# Patient Record
Sex: Male | Born: 1940 | Race: White | Hispanic: No | Marital: Married | Smoking: Never smoker
Health system: Southern US, Community
[De-identification: ages and names within clinical notes are randomized; demographics above are authoritative.]

## PROBLEM LIST (undated history)

## (undated) DIAGNOSIS — D72819 Decreased white blood cell count, unspecified: Secondary | ICD-10-CM

## (undated) DIAGNOSIS — I10 Essential (primary) hypertension: Secondary | ICD-10-CM

## (undated) DIAGNOSIS — D72821 Monocytosis (symptomatic): Secondary | ICD-10-CM

## (undated) DIAGNOSIS — D689 Coagulation defect, unspecified: Secondary | ICD-10-CM

## (undated) DIAGNOSIS — L0291 Cutaneous abscess, unspecified: Secondary | ICD-10-CM

## (undated) DIAGNOSIS — D693 Immune thrombocytopenic purpura: Principal | ICD-10-CM

## (undated) HISTORY — DX: Cutaneous abscess, unspecified: L02.91

## (undated) HISTORY — DX: Immune thrombocytopenic purpura: D69.3

## (undated) HISTORY — DX: Coagulation defect, unspecified: D68.9

## (undated) HISTORY — DX: Decreased white blood cell count, unspecified: D72.819

## (undated) HISTORY — DX: Essential (primary) hypertension: I10

## (undated) HISTORY — DX: Monocytosis (symptomatic): D72.821

---
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
## 1998-12-20 HISTORY — PX: CYSTECTOMY: SUR359

## 2003-12-21 HISTORY — PX: OTHER SURGICAL HISTORY: SHX169

## 2004-01-24 ENCOUNTER — Ambulatory Visit (HOSPITAL_COMMUNITY): Admission: RE | Admit: 2004-01-24 | Discharge: 2004-01-24 | Payer: Self-pay | Admitting: General Surgery

## 2004-01-24 ENCOUNTER — Ambulatory Visit (HOSPITAL_BASED_OUTPATIENT_CLINIC_OR_DEPARTMENT_OTHER): Admission: RE | Admit: 2004-01-24 | Discharge: 2004-01-24 | Payer: Self-pay | Admitting: General Surgery

## 2004-01-24 ENCOUNTER — Encounter (INDEPENDENT_AMBULATORY_CARE_PROVIDER_SITE_OTHER): Payer: Self-pay | Admitting: *Deleted

## 2004-12-25 ENCOUNTER — Ambulatory Visit (HOSPITAL_COMMUNITY): Admission: RE | Admit: 2004-12-25 | Discharge: 2004-12-25 | Payer: Self-pay | Admitting: Family Medicine

## 2008-01-25 ENCOUNTER — Ambulatory Visit: Payer: Self-pay | Admitting: Oncology

## 2008-02-09 LAB — CBC WITH DIFFERENTIAL/PLATELET
Basophils Absolute: 0 10*3/uL (ref 0.0–0.1)
EOS%: 1.1 % (ref 0.0–7.0)
Eosinophils Absolute: 0 10*3/uL (ref 0.0–0.5)
HCT: 43.8 % (ref 38.7–49.9)
HGB: 15.3 g/dL (ref 13.0–17.1)
MCH: 30.7 pg (ref 28.0–33.4)
MCV: 87.7 fL (ref 81.6–98.0)
MONO%: 11.9 % (ref 0.0–13.0)
NEUT#: 2.2 10*3/uL (ref 1.5–6.5)
NEUT%: 53.3 % (ref 40.0–75.0)

## 2008-02-09 LAB — CHCC SMEAR

## 2008-02-09 LAB — ERYTHROCYTE SEDIMENTATION RATE: Sed Rate: 3 mm/hr (ref 0–20)

## 2008-02-09 LAB — MORPHOLOGY: PLT EST: DECREASED

## 2008-02-12 LAB — LACTATE DEHYDROGENASE: LDH: 102 U/L (ref 94–250)

## 2008-02-12 LAB — RHEUMATOID FACTOR: Rhuematoid fact SerPl-aCnc: <20 IU/mL (ref 0–20)

## 2008-02-14 ENCOUNTER — Encounter: Admission: RE | Admit: 2008-02-14 | Discharge: 2008-02-14 | Payer: Self-pay | Admitting: Oncology

## 2008-03-06 LAB — CBC WITH DIFFERENTIAL/PLATELET
Eosinophils Absolute: 0 10*3/uL (ref 0.0–0.5)
HCT: 42.8 % (ref 38.7–49.9)
LYMPH%: 29.8 % (ref 14.0–48.0)
MCHC: 35 g/dL (ref 32.0–35.9)
MCV: 87.9 fL (ref 81.6–98.0)
MONO#: 0.5 10*3/uL (ref 0.1–0.9)
NEUT#: 2.7 10*3/uL (ref 1.5–6.5)
NEUT%: 58.7 % (ref 40.0–75.0)
Platelets: 60 10*3/uL — ABNORMAL LOW (ref 145–400)
WBC: 4.7 10*3/uL (ref 4.0–10.0)

## 2008-03-08 LAB — EPSTEIN-BARR VIRUS VCA, IGM: EBV VCA IgM: 0.29 {ISR}

## 2008-03-08 LAB — HEPATITIS C ANTIBODY: HCV Ab: NEGATIVE

## 2008-03-08 LAB — HEPATITIS B SURFACE ANTIGEN: Hepatitis B Surface Ag: NEGATIVE

## 2008-03-08 LAB — EPSTEIN-BARR VIRUS EARLY D ANTIGEN ANTIBODY, IGG: EBV EA IgG: 2.48 {ISR} — ABNORMAL HIGH

## 2008-03-08 LAB — HEPATITIS A ANTIBODY, IGM: Hep A IgM: NEGATIVE

## 2008-03-28 ENCOUNTER — Ambulatory Visit: Payer: Self-pay | Admitting: Oncology

## 2008-04-05 LAB — CBC WITH DIFFERENTIAL/PLATELET
Basophils Absolute: 0 10*3/uL (ref 0.0–0.1)
EOS%: 2.6 % (ref 0.0–7.0)
Eosinophils Absolute: 0.1 10*3/uL (ref 0.0–0.5)
HCT: 41.7 % (ref 38.7–49.9)
HGB: 14.8 g/dL (ref 13.0–17.1)
LYMPH%: 32.9 % (ref 14.0–48.0)
MCH: 31.2 pg (ref 28.0–33.4)
MCV: 87.7 fL (ref 81.6–98.0)
MONO%: 10.8 % (ref 0.0–13.0)
NEUT%: 53.6 % (ref 40.0–75.0)
Platelets: 57 10*3/uL — ABNORMAL LOW (ref 145–400)
RDW: 12.8 % (ref 11.2–14.6)

## 2008-05-01 LAB — CBC WITH DIFFERENTIAL/PLATELET
Basophils Absolute: 0 10*3/uL (ref 0.0–0.1)
EOS%: 2.2 % (ref 0.0–7.0)
Eosinophils Absolute: 0.1 10*3/uL (ref 0.0–0.5)
HCT: 43.3 % (ref 38.7–49.9)
HGB: 15.1 g/dL (ref 13.0–17.1)
MCH: 30.8 pg (ref 28.0–33.4)
NEUT#: 3.1 10*3/uL (ref 1.5–6.5)
NEUT%: 54.7 % (ref 40.0–75.0)
lymph#: 1.7 10*3/uL (ref 0.9–3.3)

## 2008-06-10 ENCOUNTER — Ambulatory Visit: Payer: Self-pay | Admitting: Oncology

## 2008-06-12 LAB — CBC WITH DIFFERENTIAL/PLATELET
BASO%: 0.4 % (ref 0.0–2.0)
Basophils Absolute: 0 10*3/uL (ref 0.0–0.1)
HCT: 42.5 % (ref 38.7–49.9)
HGB: 15 g/dL (ref 13.0–17.1)
MCHC: 35.3 g/dL (ref 32.0–35.9)
MONO#: 0.7 10*3/uL (ref 0.1–0.9)
NEUT%: 45.1 % (ref 40.0–75.0)
RDW: 12.9 % (ref 11.2–14.6)
WBC: 4.9 10*3/uL (ref 4.0–10.0)
lymph#: 1.9 10*3/uL (ref 0.9–3.3)

## 2008-08-04 ENCOUNTER — Ambulatory Visit: Payer: Self-pay | Admitting: Oncology

## 2008-08-07 LAB — CBC WITH DIFFERENTIAL/PLATELET
BASO%: 0.2 % (ref 0.0–2.0)
MCHC: 34.8 g/dL (ref 32.0–35.9)
MONO#: 0.7 10*3/uL (ref 0.1–0.9)
RBC: 4.76 10*6/uL (ref 4.20–5.71)
RDW: 12.6 % (ref 11.2–14.6)
WBC: 4.9 10*3/uL (ref 4.0–10.0)
lymph#: 1.7 10*3/uL (ref 0.9–3.3)

## 2008-09-04 LAB — CBC WITH DIFFERENTIAL/PLATELET
BASO%: 0.5 % (ref 0.0–2.0)
EOS%: 1 % (ref 0.0–7.0)
LYMPH%: 37 % (ref 14.0–48.0)
MCHC: 35 g/dL (ref 32.0–35.9)
MCV: 89.3 fL (ref 81.6–98.0)
MONO#: 0.7 10*3/uL (ref 0.1–0.9)
MONO%: 14.7 % — ABNORMAL HIGH (ref 0.0–13.0)
Platelets: 48 10*3/uL — ABNORMAL LOW (ref 145–400)
RBC: 4.73 10*6/uL (ref 4.20–5.71)
WBC: 4.6 10*3/uL (ref 4.0–10.0)

## 2008-09-30 ENCOUNTER — Ambulatory Visit: Payer: Self-pay | Admitting: Oncology

## 2008-10-02 LAB — CBC WITH DIFFERENTIAL/PLATELET
BASO%: 0.7 % (ref 0.0–2.0)
Basophils Absolute: 0 10*3/uL (ref 0.0–0.1)
Eosinophils Absolute: 0 10*3/uL (ref 0.0–0.5)
HCT: 43.9 % (ref 38.7–49.9)
HGB: 15.1 g/dL (ref 13.0–17.1)
LYMPH%: 36.7 % (ref 14.0–48.0)
MONO#: 0.8 10*3/uL (ref 0.1–0.9)
NEUT#: 2.7 10*3/uL (ref 1.5–6.5)
NEUT%: 47.6 % (ref 40.0–75.0)
Platelets: 32 10*3/uL — ABNORMAL LOW (ref 145–400)
WBC: 5.6 10*3/uL (ref 4.0–10.0)
lymph#: 2 10*3/uL (ref 0.9–3.3)

## 2008-10-09 LAB — CBC WITH DIFFERENTIAL/PLATELET
BASO%: 1 % (ref 0.0–2.0)
Basophils Absolute: 0 10*3/uL (ref 0.0–0.1)
EOS%: 1 % (ref 0.0–7.0)
HCT: 42.5 % (ref 38.7–49.9)
HGB: 14.9 g/dL (ref 13.0–17.1)
LYMPH%: 39 % (ref 14.0–48.0)
MCH: 31.1 pg (ref 28.0–33.4)
MCHC: 35 g/dL (ref 32.0–35.9)
NEUT%: 42.5 % (ref 40.0–75.0)
Platelets: 35 10*3/uL — ABNORMAL LOW (ref 145–400)
lymph#: 1.8 10*3/uL (ref 0.9–3.3)

## 2008-10-16 LAB — CBC WITH DIFFERENTIAL/PLATELET
BASO%: 0.3 % (ref 0.0–2.0)
Basophils Absolute: 0 10*3/uL (ref 0.0–0.1)
EOS%: 1.2 % (ref 0.0–7.0)
HCT: 43.7 % (ref 38.7–49.9)
HGB: 15.3 g/dL (ref 13.0–17.1)
MCH: 31 pg (ref 28.0–33.4)
MCHC: 35 g/dL (ref 32.0–35.9)
MCV: 88.8 fL (ref 81.6–98.0)
MONO%: 15.5 % — ABNORMAL HIGH (ref 0.0–13.0)
NEUT%: 50 % (ref 40.0–75.0)
RDW: 12.3 % (ref 11.2–14.6)
lymph#: 1.5 10*3/uL (ref 0.9–3.3)

## 2008-10-23 ENCOUNTER — Ambulatory Visit (HOSPITAL_COMMUNITY): Admission: RE | Admit: 2008-10-23 | Discharge: 2008-10-23 | Payer: Self-pay | Admitting: Oncology

## 2008-10-23 LAB — CBC WITH DIFFERENTIAL/PLATELET
Basophils Absolute: 0 10*3/uL (ref 0.0–0.1)
Eosinophils Absolute: 0 10*3/uL (ref 0.0–0.5)
HGB: 14.9 g/dL (ref 13.0–17.1)
MCV: 89 fL (ref 81.6–98.0)
MONO#: 0.7 10*3/uL (ref 0.1–0.9)
MONO%: 15.3 % — ABNORMAL HIGH (ref 0.0–13.0)
NEUT#: 2.1 10*3/uL (ref 1.5–6.5)
Platelets: 33 10*3/uL — ABNORMAL LOW (ref 145–400)
RBC: 4.81 10*6/uL (ref 4.20–5.71)
RDW: 12.9 % (ref 11.2–14.6)
WBC: 4.5 10*3/uL (ref 4.0–10.0)

## 2008-10-23 LAB — CHCC SMEAR

## 2008-10-30 LAB — CBC WITH DIFFERENTIAL/PLATELET
Basophils Absolute: 0 10*3/uL (ref 0.0–0.1)
Eosinophils Absolute: 0 10*3/uL (ref 0.0–0.5)
HCT: 45.1 % (ref 38.7–49.9)
HGB: 15.2 g/dL (ref 13.0–17.1)
LYMPH%: 14.9 % (ref 14.0–48.0)
MCHC: 33.8 g/dL (ref 32.0–35.9)
MONO#: 1.1 10*3/uL — ABNORMAL HIGH (ref 0.1–0.9)
NEUT%: 72.6 % (ref 40.0–75.0)
Platelets: 27 10*3/uL — ABNORMAL LOW (ref 145–400)
WBC: 9 10*3/uL (ref 4.0–10.0)
lymph#: 1.3 10*3/uL (ref 0.9–3.3)

## 2008-11-06 LAB — CBC WITH DIFFERENTIAL/PLATELET
BASO%: 0.9 % (ref 0.0–2.0)
Eosinophils Absolute: 0 10*3/uL (ref 0.0–0.5)
MCHC: 35.1 g/dL (ref 32.0–35.9)
MCV: 87.5 fL (ref 81.6–98.0)
MONO%: 3.3 % (ref 0.0–13.0)
NEUT#: 11.7 10*3/uL — ABNORMAL HIGH (ref 1.5–6.5)
RBC: 5.09 10*6/uL (ref 4.20–5.71)
RDW: 12.1 % (ref 11.2–14.6)
WBC: 14.2 10*3/uL — ABNORMAL HIGH (ref 4.0–10.0)

## 2008-11-13 LAB — CBC WITH DIFFERENTIAL/PLATELET
BASO%: 0.2 % (ref 0.0–2.0)
Eosinophils Absolute: 0 10*3/uL (ref 0.0–0.5)
LYMPH%: 11.1 % — ABNORMAL LOW (ref 14.0–48.0)
MCHC: 34.5 g/dL (ref 32.0–35.9)
MONO#: 0.8 10*3/uL (ref 0.1–0.9)
NEUT#: 8.3 10*3/uL — ABNORMAL HIGH (ref 1.5–6.5)
Platelets: 39 10*3/uL — ABNORMAL LOW (ref 145–400)
RBC: 5.05 10*6/uL (ref 4.20–5.71)
RDW: 14.1 % (ref 11.2–14.6)
WBC: 10.2 10*3/uL — ABNORMAL HIGH (ref 4.0–10.0)
lymph#: 1.1 10*3/uL (ref 0.9–3.3)

## 2008-11-18 ENCOUNTER — Ambulatory Visit: Payer: Self-pay | Admitting: Oncology

## 2008-11-20 LAB — COMPREHENSIVE METABOLIC PANEL
ALT: 58 U/L — ABNORMAL HIGH (ref 0–53)
AST: 24 U/L (ref 0–37)
Albumin: 3 g/dL — ABNORMAL LOW (ref 3.5–5.2)
BUN: 18 mg/dL (ref 6–23)
Calcium: 8.6 mg/dL (ref 8.4–10.5)
Chloride: 105 mEq/L (ref 96–112)
Potassium: 4 mEq/L (ref 3.5–5.3)
Sodium: 140 mEq/L (ref 135–145)
Total Protein: 5.3 g/dL — ABNORMAL LOW (ref 6.0–8.3)

## 2008-11-20 LAB — CBC WITH DIFFERENTIAL/PLATELET
BASO%: 0.9 % (ref 0.0–2.0)
Eosinophils Absolute: 0 10*3/uL (ref 0.0–0.5)
MCHC: 34.3 g/dL (ref 32.0–35.9)
MONO#: 1.5 10*3/uL — ABNORMAL HIGH (ref 0.1–0.9)
NEUT#: 5.6 10*3/uL (ref 1.5–6.5)
RBC: 5 10*6/uL (ref 4.20–5.71)
RDW: 14.2 % (ref 11.2–14.6)
WBC: 9.5 10*3/uL (ref 4.0–10.0)
lymph#: 2.3 10*3/uL (ref 0.9–3.3)

## 2008-12-04 LAB — CBC WITH DIFFERENTIAL/PLATELET
BASO%: 0.8 % (ref 0.0–2.0)
EOS%: 0.3 % (ref 0.0–7.0)
HGB: 15.4 g/dL (ref 13.0–17.1)
MCH: 31.1 pg (ref 28.0–33.4)
MCHC: 34.4 g/dL (ref 32.0–35.9)
MONO%: 12.8 % (ref 0.0–13.0)
RBC: 4.94 10*6/uL (ref 4.20–5.71)
RDW: 14.6 % (ref 11.2–14.6)
lymph#: 2.3 10*3/uL (ref 0.9–3.3)

## 2008-12-11 LAB — CBC WITH DIFFERENTIAL/PLATELET
Basophils Absolute: 0 10*3/uL (ref 0.0–0.1)
Eosinophils Absolute: 0 10*3/uL (ref 0.0–0.5)
HGB: 15.6 g/dL (ref 13.0–17.1)
NEUT#: 4.6 10*3/uL (ref 1.5–6.5)
RBC: 5 10*6/uL (ref 4.20–5.71)
RDW: 14.2 % (ref 11.2–14.6)
WBC: 8.5 10*3/uL (ref 4.0–10.0)
lymph#: 2.8 10*3/uL (ref 0.9–3.3)

## 2008-12-23 ENCOUNTER — Ambulatory Visit: Payer: Self-pay | Admitting: Oncology

## 2008-12-25 LAB — CBC WITH DIFFERENTIAL/PLATELET
BASO%: 0.7 % (ref 0.0–2.0)
LYMPH%: 29.9 % (ref 14.0–48.0)
MCHC: 34.3 g/dL (ref 32.0–35.9)
MONO#: 1.3 10*3/uL — ABNORMAL HIGH (ref 0.1–0.9)
RBC: 4.56 10*6/uL (ref 4.20–5.71)
WBC: 9.6 10*3/uL (ref 4.0–10.0)
lymph#: 2.9 10*3/uL (ref 0.9–3.3)

## 2009-01-01 LAB — CBC WITH DIFFERENTIAL/PLATELET
Basophils Absolute: 0 10*3/uL (ref 0.0–0.1)
EOS%: 0.3 % (ref 0.0–7.0)
Eosinophils Absolute: 0 10*3/uL (ref 0.0–0.5)
LYMPH%: 26.7 % (ref 14.0–48.0)
MCH: 31.1 pg (ref 28.0–33.4)
MCV: 91 fL (ref 81.6–98.0)
MONO%: 10.4 % (ref 0.0–13.0)
NEUT#: 5.3 10*3/uL (ref 1.5–6.5)
Platelets: 55 10*3/uL — ABNORMAL LOW (ref 145–400)
RBC: 4.6 10*6/uL (ref 4.20–5.71)
RDW: 14.4 % (ref 11.2–14.6)

## 2009-01-15 LAB — CBC WITH DIFFERENTIAL/PLATELET
BASO%: 0.4 % (ref 0.0–2.0)
LYMPH%: 34.1 % (ref 14.0–48.0)
MCHC: 34 g/dL (ref 32.0–35.9)
MCV: 92.3 fL (ref 81.6–98.0)
MONO%: 14.7 % — ABNORMAL HIGH (ref 0.0–13.0)
NEUT#: 4.7 10*3/uL (ref 1.5–6.5)
Platelets: 48 10*3/uL — ABNORMAL LOW (ref 145–400)
RBC: 4.8 10*6/uL (ref 4.20–5.71)
RDW: 15.1 % — ABNORMAL HIGH (ref 11.2–14.6)
WBC: 9.3 10*3/uL (ref 4.0–10.0)

## 2009-01-22 LAB — CBC WITH DIFFERENTIAL/PLATELET
BASO%: 0.8 % (ref 0.0–2.0)
EOS%: 0.6 % (ref 0.0–7.0)
HCT: 43.4 % (ref 38.7–49.9)
MCH: 31.3 pg (ref 28.0–33.4)
MCHC: 33.6 g/dL (ref 32.0–35.9)
MONO%: 13.9 % — ABNORMAL HIGH (ref 0.0–13.0)
NEUT%: 50.1 % (ref 40.0–75.0)
lymph#: 3 10*3/uL (ref 0.9–3.3)

## 2009-02-05 LAB — CBC WITH DIFFERENTIAL/PLATELET
BASO%: 0.4 % (ref 0.0–2.0)
Basophils Absolute: 0 10*3/uL (ref 0.0–0.1)
EOS%: 0.3 % (ref 0.0–7.0)
HGB: 14.6 g/dL (ref 13.0–17.1)
MCH: 31.8 pg (ref 28.0–33.4)
MCHC: 33.9 g/dL (ref 32.0–35.9)
RBC: 4.6 10*6/uL (ref 4.20–5.71)
RDW: 15.3 % — ABNORMAL HIGH (ref 11.2–14.6)
lymph#: 2.7 10*3/uL (ref 0.9–3.3)

## 2009-02-10 ENCOUNTER — Ambulatory Visit: Payer: Self-pay | Admitting: Oncology

## 2009-02-12 LAB — CBC WITH DIFFERENTIAL/PLATELET
BASO%: 0.5 % (ref 0.0–2.0)
EOS%: 0.4 % (ref 0.0–7.0)
LYMPH%: 30.9 % (ref 14.0–49.0)
MCHC: 34 g/dL (ref 32.0–36.0)
MCV: 93.6 fL (ref 79.3–98.0)
MONO%: 15.8 % — ABNORMAL HIGH (ref 0.0–14.0)
Platelets: 50 10*3/uL — ABNORMAL LOW (ref 140–400)
RBC: 4.81 10*6/uL (ref 4.20–5.82)
WBC: 7.7 10*3/uL (ref 4.0–10.3)

## 2009-02-19 LAB — COMPREHENSIVE METABOLIC PANEL
ALT: 174 U/L — ABNORMAL HIGH (ref 0–53)
AST: 47 U/L — ABNORMAL HIGH (ref 0–37)
Albumin: 3.5 g/dL (ref 3.5–5.2)
Alkaline Phosphatase: 30 U/L — ABNORMAL LOW (ref 39–117)
Calcium: 9.8 mg/dL (ref 8.4–10.5)
Chloride: 105 mEq/L (ref 96–112)
Creatinine, Ser: 1.16 mg/dL (ref 0.40–1.50)
Potassium: 4.9 mEq/L (ref 3.5–5.3)

## 2009-02-19 LAB — CBC WITH DIFFERENTIAL/PLATELET
BASO%: 0.1 % (ref 0.0–2.0)
EOS%: 0 % (ref 0.0–7.0)
MCH: 31.7 pg (ref 27.2–33.4)
MCHC: 33.8 g/dL (ref 32.0–36.0)
MCV: 93.6 fL (ref 79.3–98.0)
MONO%: 5.2 % (ref 0.0–14.0)
RBC: 5.01 10*6/uL (ref 4.20–5.82)
RDW: 14.3 % (ref 11.0–14.6)
lymph#: 0.9 10*3/uL (ref 0.9–3.3)

## 2009-02-19 LAB — LACTATE DEHYDROGENASE: LDH: 175 U/L (ref 94–250)

## 2009-03-05 LAB — CBC WITH DIFFERENTIAL/PLATELET
BASO%: 1 % (ref 0.0–2.0)
EOS%: 0.5 % (ref 0.0–7.0)
HCT: 43.2 % (ref 38.4–49.9)
LYMPH%: 28.2 % (ref 14.0–49.0)
MCH: 31.8 pg (ref 27.2–33.4)
MCHC: 34.1 g/dL (ref 32.0–36.0)
NEUT%: 56.7 % (ref 39.0–75.0)
Platelets: 40 10*3/uL — ABNORMAL LOW (ref 140–400)
lymph#: 2.7 10*3/uL (ref 0.9–3.3)

## 2009-03-05 LAB — COMPREHENSIVE METABOLIC PANEL
ALT: 125 U/L — ABNORMAL HIGH (ref 0–53)
AST: 34 U/L (ref 0–37)
Alkaline Phosphatase: 39 U/L (ref 39–117)
Creatinine, Ser: 1.04 mg/dL (ref 0.40–1.50)
Total Bilirubin: 0.4 mg/dL (ref 0.3–1.2)

## 2009-03-19 LAB — CBC WITH DIFFERENTIAL/PLATELET
BASO%: 0.5 % (ref 0.0–2.0)
EOS%: 0.3 % (ref 0.0–7.0)
HCT: 44 % (ref 38.4–49.9)
LYMPH%: 30.6 % (ref 14.0–49.0)
MCH: 31.7 pg (ref 27.2–33.4)
MCHC: 34.6 g/dL (ref 32.0–36.0)
NEUT%: 56.9 % (ref 39.0–75.0)
Platelets: 44 10*3/uL — ABNORMAL LOW (ref 140–400)

## 2009-03-31 ENCOUNTER — Ambulatory Visit: Payer: Self-pay | Admitting: Oncology

## 2009-04-02 LAB — CBC WITH DIFFERENTIAL/PLATELET
Basophils Absolute: 0.1 10*3/uL (ref 0.0–0.1)
EOS%: 0.4 % (ref 0.0–7.0)
HCT: 44.2 % (ref 38.4–49.9)
HGB: 15.1 g/dL (ref 13.0–17.1)
LYMPH%: 25 % (ref 14.0–49.0)
MCH: 31.3 pg (ref 27.2–33.4)
MCV: 91.5 fL (ref 79.3–98.0)
MONO%: 12.6 % (ref 0.0–14.0)
NEUT%: 61.2 % (ref 39.0–75.0)
RDW: 13.1 % (ref 11.0–14.6)

## 2009-04-16 LAB — COMPREHENSIVE METABOLIC PANEL
ALT: 91 U/L — ABNORMAL HIGH (ref 0–53)
Albumin: 4.1 g/dL (ref 3.5–5.2)
CO2: 28 mEq/L (ref 19–32)
Calcium: 9.1 mg/dL (ref 8.4–10.5)
Chloride: 105 mEq/L (ref 96–112)
Creatinine, Ser: 1.01 mg/dL (ref 0.40–1.50)
Potassium: 4.8 mEq/L (ref 3.5–5.3)
Sodium: 140 mEq/L (ref 135–145)
Total Protein: 6.8 g/dL (ref 6.0–8.3)

## 2009-04-16 LAB — CBC WITH DIFFERENTIAL/PLATELET
BASO%: 1 % (ref 0.0–2.0)
HCT: 46.5 % (ref 38.4–49.9)
MCHC: 34.1 g/dL (ref 32.0–36.0)
MONO#: 0.6 10*3/uL (ref 0.1–0.9)
NEUT%: 65.3 % (ref 39.0–75.0)
RDW: 13 % (ref 11.0–14.6)
WBC: 5.5 10*3/uL (ref 4.0–10.3)
lymph#: 1.3 10*3/uL (ref 0.9–3.3)

## 2009-04-16 LAB — LACTATE DEHYDROGENASE: LDH: 167 U/L (ref 94–250)

## 2009-04-25 IMAGING — CR DG CHEST 2V
2 series · 2 of 2 positions shown · non-contrast
Comparison: 12/25/2004

CLINICAL DATA: Low platelets and increased monocytes/evaluate for
granulomatous disease

CHEST - 2 VIEW

[w chest pa]
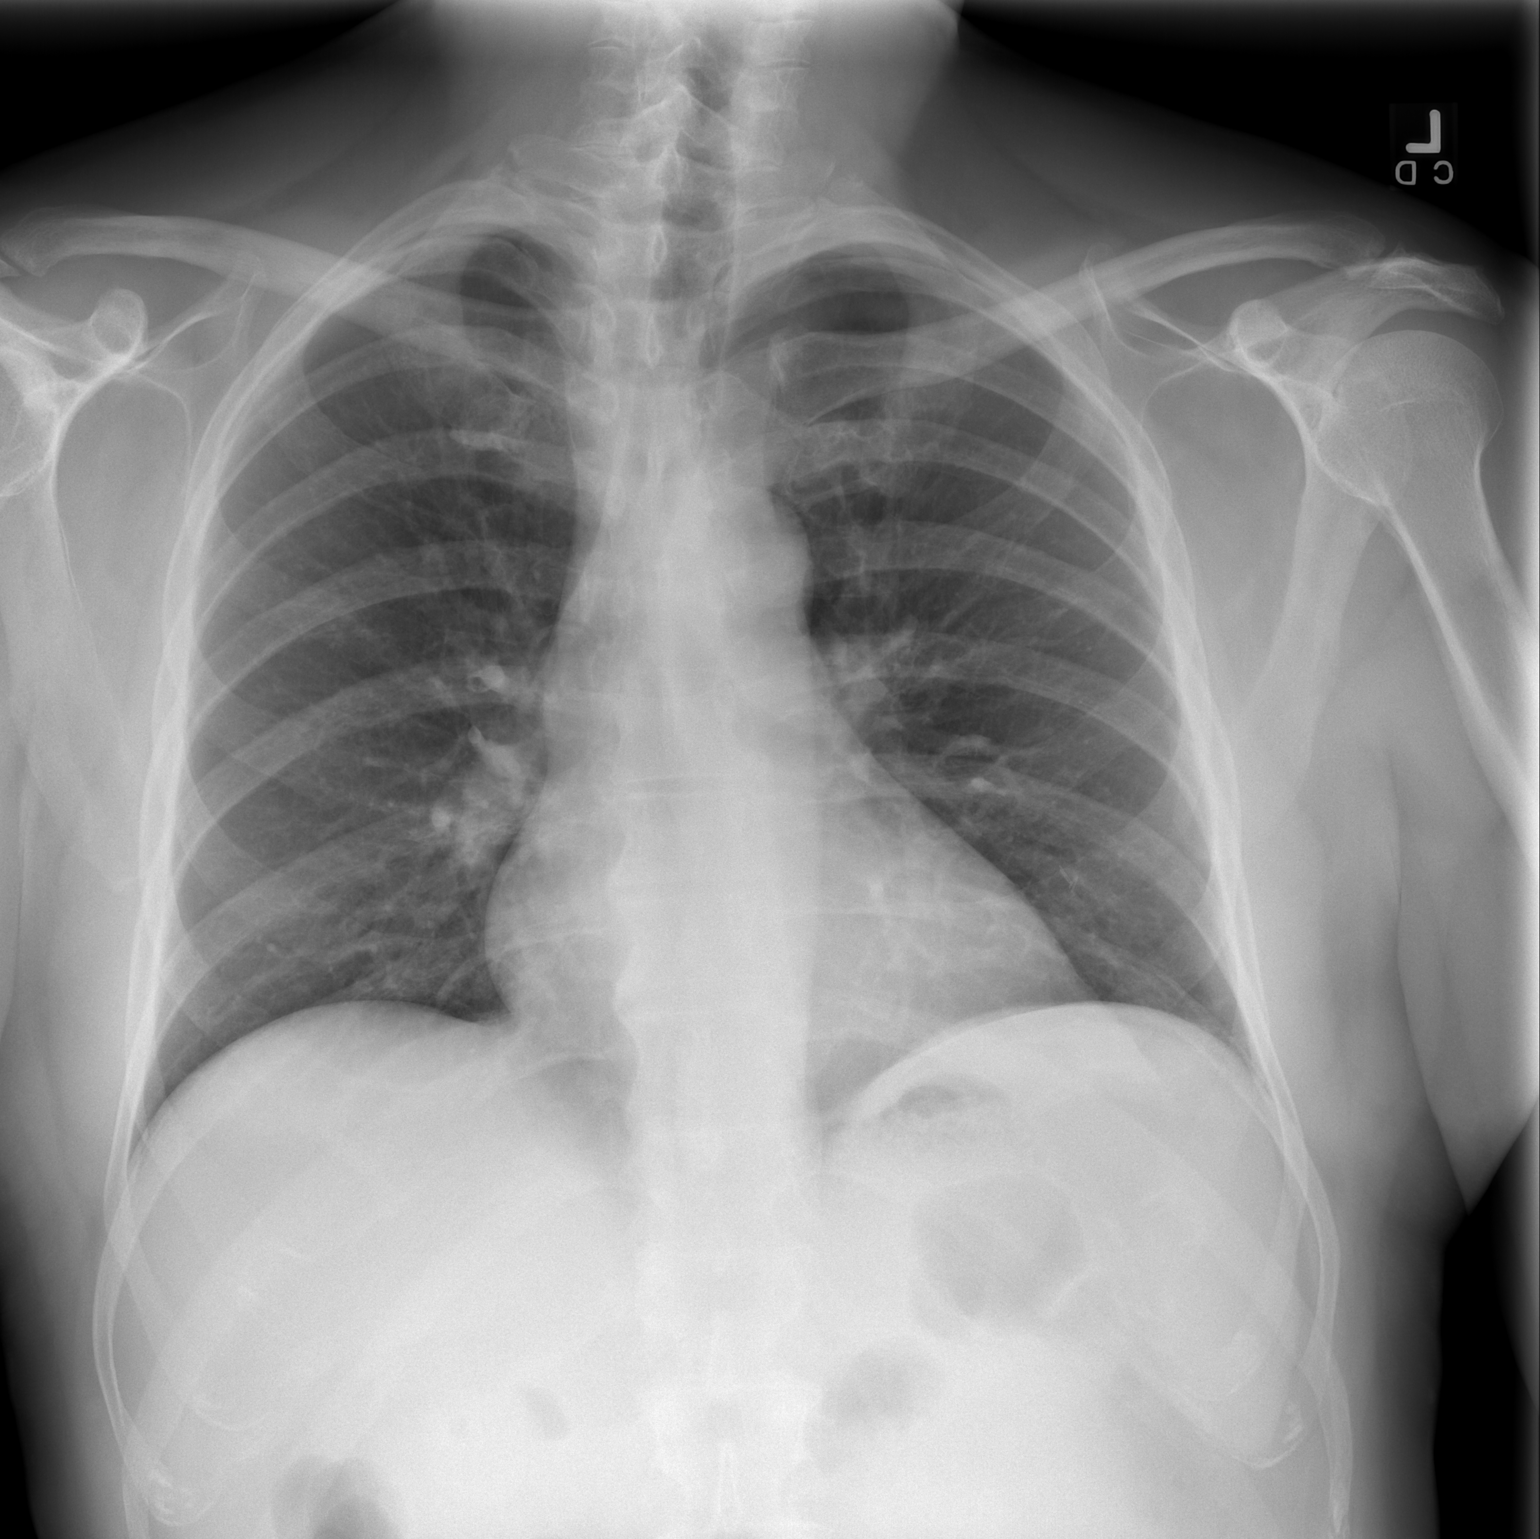

[w chest lat]
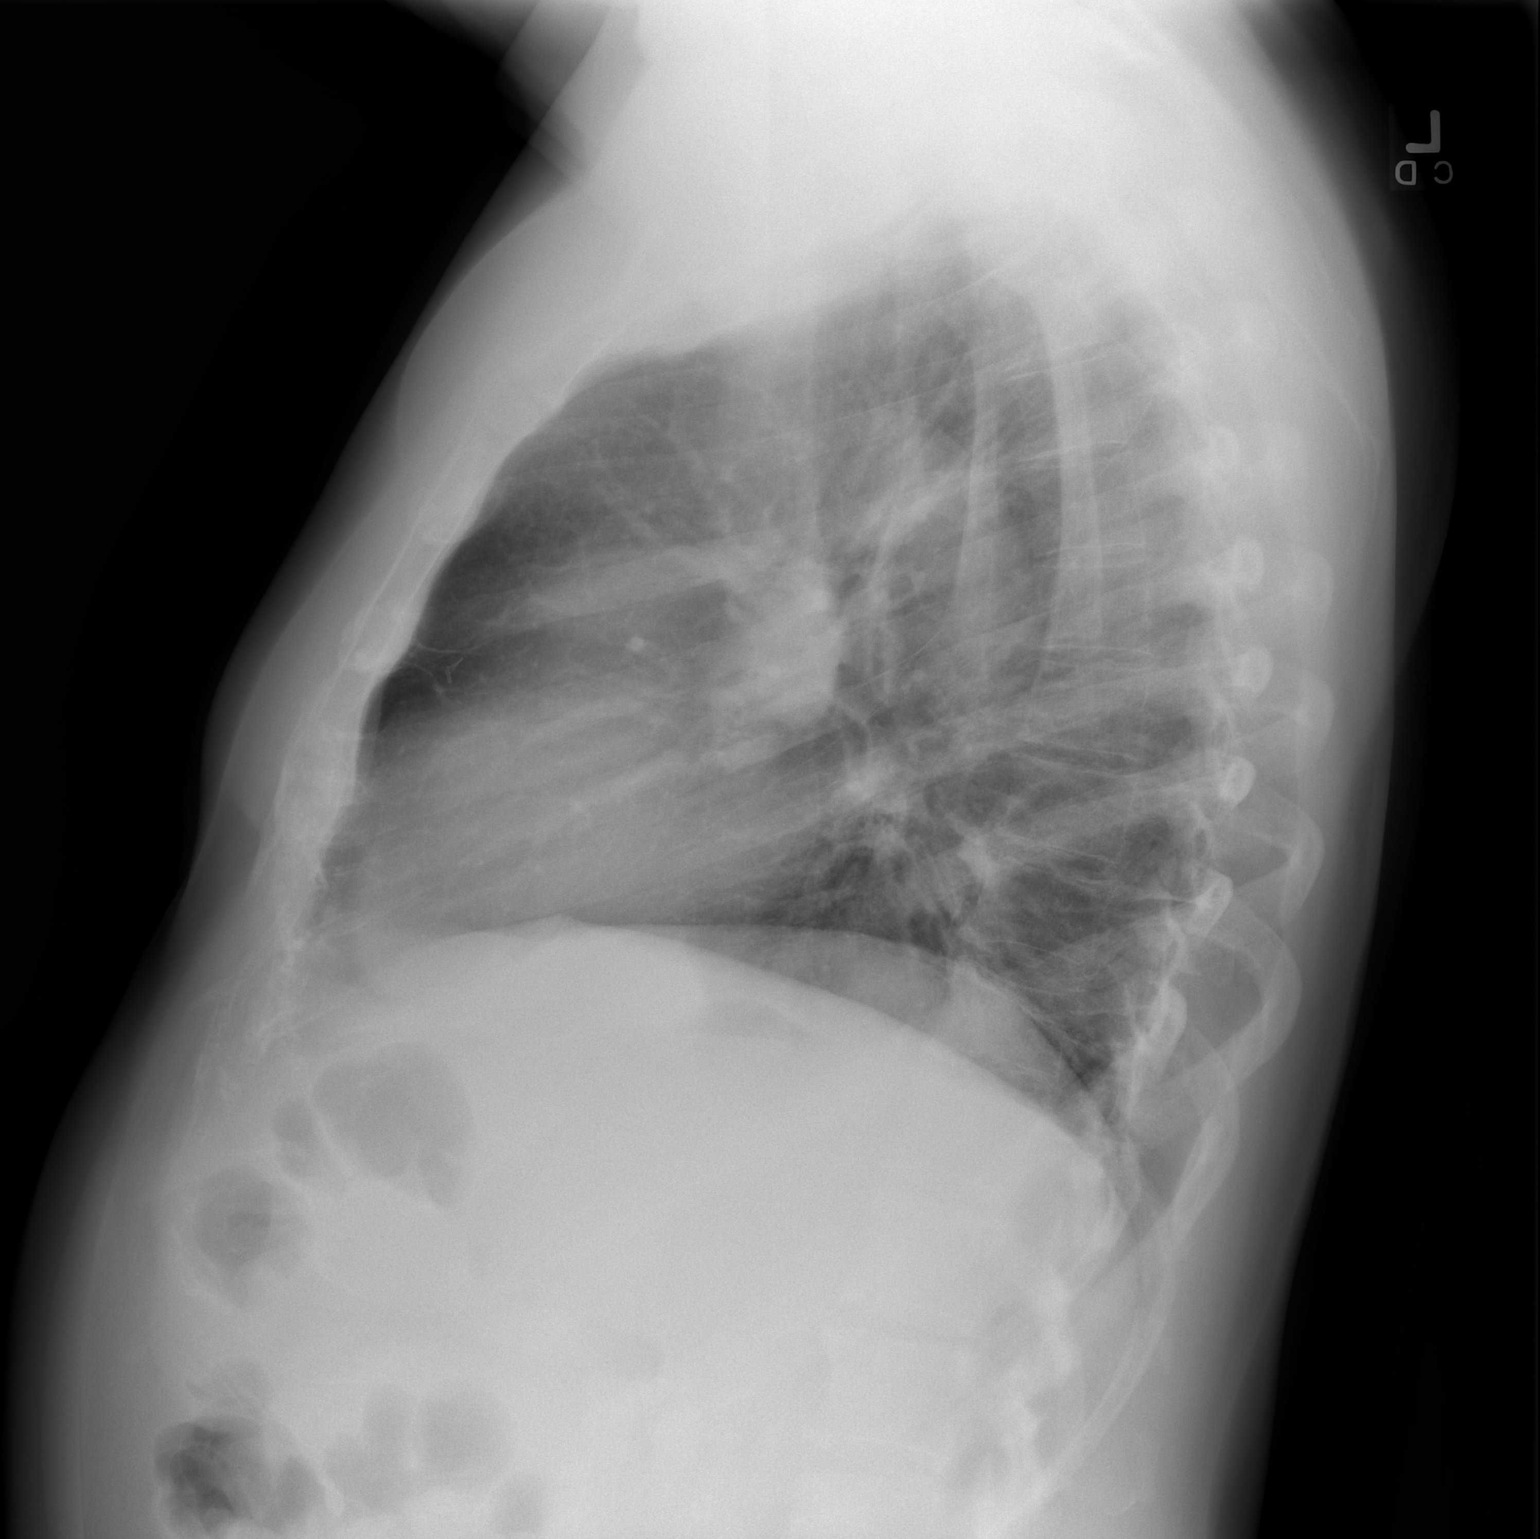

[2 of 2 positions shown; findings below may reference images not displayed]

FINDINGS: Heart and mediastinal contours normal.  Lungs clear.  No
definite granulomatous lesions.  No calcified mediastinal nodes.
There is mild peribronchial thickening.

No pleural fluid or osseous lesions.
IMPRESSION: Mild peribronchial thickening - no active disease or definite
changes of old granulomatous disease.

## 2009-04-30 LAB — CBC WITH DIFFERENTIAL/PLATELET
Basophils Absolute: 0 10*3/uL (ref 0.0–0.1)
Eosinophils Absolute: 0 10*3/uL (ref 0.0–0.5)
HCT: 43.4 % (ref 38.4–49.9)
HGB: 14.8 g/dL (ref 13.0–17.1)
LYMPH%: 39 % (ref 14.0–49.0)
MONO#: 0.7 10*3/uL (ref 0.1–0.9)
NEUT#: 1.5 10*3/uL (ref 1.5–6.5)
NEUT%: 41.4 % (ref 39.0–75.0)
Platelets: 35 10*3/uL — ABNORMAL LOW (ref 140–400)
WBC: 3.7 10*3/uL — ABNORMAL LOW (ref 4.0–10.3)

## 2009-05-14 LAB — CBC WITH DIFFERENTIAL/PLATELET
BASO%: 0.3 % (ref 0.0–2.0)
Basophils Absolute: 0 10e3/uL (ref 0.0–0.1)
EOS%: 0.5 % (ref 0.0–7.0)
Eosinophils Absolute: 0 10e3/uL (ref 0.0–0.5)
HCT: 41.6 % (ref 38.4–49.9)
HGB: 14.3 g/dL (ref 13.0–17.1)
LYMPH%: 41.4 % (ref 14.0–49.0)
MCH: 29.7 pg (ref 27.2–33.4)
MCHC: 34.4 g/dL (ref 32.0–36.0)
MCV: 86.5 fL (ref 79.3–98.0)
MONO#: 0.8 10e3/uL (ref 0.1–0.9)
MONO%: 21.1 % — ABNORMAL HIGH (ref 0.0–14.0)
NEUT#: 1.5 10e3/uL (ref 1.5–6.5)
NEUT%: 36.7 % — ABNORMAL LOW (ref 39.0–75.0)
Platelets: 36 10e3/uL — ABNORMAL LOW (ref 140–400)
RBC: 4.81 10e6/uL (ref 4.20–5.82)
RDW: 12.7 % (ref 11.0–14.6)
WBC: 4 10e3/uL (ref 4.0–10.3)
lymph#: 1.7 10e3/uL (ref 0.9–3.3)
nRBC: 0 % (ref 0–0)

## 2009-05-26 ENCOUNTER — Ambulatory Visit: Payer: Self-pay | Admitting: Oncology

## 2009-05-28 LAB — CBC WITH DIFFERENTIAL/PLATELET
BASO%: 0.5 % (ref 0.0–2.0)
Eosinophils Absolute: 0 10*3/uL (ref 0.0–0.5)
LYMPH%: 38.7 % (ref 14.0–49.0)
MCHC: 34.2 g/dL (ref 32.0–36.0)
MONO#: 0.8 10*3/uL (ref 0.1–0.9)
NEUT#: 1.5 10*3/uL (ref 1.5–6.5)
RBC: 4.91 10*6/uL (ref 4.20–5.82)
RDW: 12.6 % (ref 11.0–14.6)
WBC: 3.9 10*3/uL — ABNORMAL LOW (ref 4.0–10.3)
lymph#: 1.5 10*3/uL (ref 0.9–3.3)
nRBC: 0 % (ref 0–0)

## 2009-06-12 ENCOUNTER — Encounter: Payer: Self-pay | Admitting: Oncology

## 2009-06-12 ENCOUNTER — Other Ambulatory Visit: Admission: RE | Admit: 2009-06-12 | Discharge: 2009-06-12 | Payer: Self-pay | Admitting: Oncology

## 2009-06-12 LAB — MORPHOLOGY
PLT EST: DECREASED
RBC Comments: NORMAL

## 2009-06-12 LAB — CBC WITH DIFFERENTIAL/PLATELET
BASO%: 0.3 % (ref 0.0–2.0)
Basophils Absolute: 0 10*3/uL (ref 0.0–0.1)
EOS%: 1 % (ref 0.0–7.0)
HGB: 14.9 g/dL (ref 13.0–17.1)
MCH: 29.8 pg (ref 27.2–33.4)
MCHC: 34.6 g/dL (ref 32.0–36.0)
MONO#: 0.7 10*3/uL (ref 0.1–0.9)
RDW: 12.5 % (ref 11.0–14.6)
WBC: 3.1 10*3/uL — ABNORMAL LOW (ref 4.0–10.3)
lymph#: 1.4 10*3/uL (ref 0.9–3.3)

## 2009-06-12 LAB — CHCC SMEAR

## 2009-06-18 LAB — CBC WITH DIFFERENTIAL/PLATELET
Eosinophils Absolute: 0 10*3/uL (ref 0.0–0.5)
MONO#: 1 10*3/uL — ABNORMAL HIGH (ref 0.1–0.9)
NEUT#: 1.8 10*3/uL (ref 1.5–6.5)
RBC: 4.95 10*6/uL (ref 4.20–5.82)
RDW: 12.5 % (ref 11.0–14.6)
WBC: 4.2 10*3/uL (ref 4.0–10.3)

## 2009-06-27 ENCOUNTER — Ambulatory Visit: Payer: Self-pay | Admitting: Oncology

## 2009-07-01 LAB — COMPREHENSIVE METABOLIC PANEL
ALT: 27 U/L (ref 0–53)
Albumin: 4 g/dL (ref 3.5–5.2)
CO2: 24 mEq/L (ref 19–32)
Calcium: 9.3 mg/dL (ref 8.4–10.5)
Chloride: 108 mEq/L (ref 96–112)
Creatinine, Ser: 0.9 mg/dL (ref 0.40–1.50)
Potassium: 4.2 mEq/L (ref 3.5–5.3)

## 2009-07-01 LAB — CBC WITH DIFFERENTIAL/PLATELET
Basophils Absolute: 0 10*3/uL (ref 0.0–0.1)
EOS%: 0.4 % (ref 0.0–7.0)
Eosinophils Absolute: 0 10*3/uL (ref 0.0–0.5)
LYMPH%: 41.7 % (ref 14.0–49.0)
MCH: 29.7 pg (ref 27.2–33.4)
MCV: 84.5 fL (ref 79.3–98.0)
MONO%: 23.4 % — ABNORMAL HIGH (ref 0.0–14.0)
NEUT#: 0.9 10*3/uL — ABNORMAL LOW (ref 1.5–6.5)
Platelets: 30 10*3/uL — ABNORMAL LOW (ref 140–400)
RBC: 5.02 10*6/uL (ref 4.20–5.82)
nRBC: 0 % (ref 0–0)

## 2009-07-01 LAB — LACTATE DEHYDROGENASE: LDH: 190 U/L (ref 94–250)

## 2009-07-15 LAB — CBC WITH DIFFERENTIAL/PLATELET
BASO%: 0.7 % (ref 0.0–2.0)
Basophils Absolute: 0 10*3/uL (ref 0.0–0.1)
EOS%: 1 % (ref 0.0–7.0)
HGB: 15.6 g/dL (ref 13.0–17.1)
MCH: 29.7 pg (ref 27.2–33.4)
MCHC: 34.9 g/dL (ref 32.0–36.0)
MCV: 85.1 fL (ref 79.3–98.0)
MONO%: 22.7 % — ABNORMAL HIGH (ref 0.0–14.0)
RBC: 5.25 10*6/uL (ref 4.20–5.82)
RDW: 12.3 % (ref 11.0–14.6)
lymph#: 1.2 10*3/uL (ref 0.9–3.3)
nRBC: 0 % (ref 0–0)

## 2009-07-29 ENCOUNTER — Ambulatory Visit: Payer: Self-pay | Admitting: Oncology

## 2009-07-29 LAB — CBC WITH DIFFERENTIAL/PLATELET
Basophils Absolute: 0 10*3/uL (ref 0.0–0.1)
Eosinophils Absolute: 0 10*3/uL (ref 0.0–0.5)
HCT: 44.4 % (ref 38.4–49.9)
HGB: 15.2 g/dL (ref 13.0–17.1)
LYMPH%: 40.3 % (ref 14.0–49.0)
MCV: 85.7 fL (ref 79.3–98.0)
MONO#: 0.9 10*3/uL (ref 0.1–0.9)
MONO%: 19.7 % — ABNORMAL HIGH (ref 0.0–14.0)
NEUT#: 1.8 10*3/uL (ref 1.5–6.5)
NEUT%: 39.4 % (ref 39.0–75.0)
Platelets: 30 10*3/uL — ABNORMAL LOW (ref 140–400)
WBC: 4.5 10*3/uL (ref 4.0–10.3)
nRBC: 0 % (ref 0–0)

## 2009-08-05 LAB — CBC WITH DIFFERENTIAL/PLATELET
BASO%: 0.2 % (ref 0.0–2.0)
Basophils Absolute: 0 10*3/uL (ref 0.0–0.1)
EOS%: 0.5 % (ref 0.0–7.0)
HCT: 44.5 % (ref 38.4–49.9)
HGB: 15.5 g/dL (ref 13.0–17.1)
LYMPH%: 33 % (ref 14.0–49.0)
MCH: 29.6 pg (ref 27.2–33.4)
MCHC: 34.8 g/dL (ref 32.0–36.0)
MCV: 85.1 fL (ref 79.3–98.0)
NEUT%: 43.7 % (ref 39.0–75.0)
Platelets: 28 10*3/uL — ABNORMAL LOW (ref 140–400)
lymph#: 1.4 10*3/uL (ref 0.9–3.3)

## 2009-08-27 LAB — CBC WITH DIFFERENTIAL/PLATELET
Basophils Absolute: 0 10*3/uL (ref 0.0–0.1)
EOS%: 1 % (ref 0.0–7.0)
Eosinophils Absolute: 0 10*3/uL (ref 0.0–0.5)
HCT: 43.3 % (ref 38.4–49.9)
HGB: 15.2 g/dL (ref 13.0–17.1)
MCH: 29.7 pg (ref 27.2–33.4)
MCV: 84.6 fL (ref 79.3–98.0)
NEUT#: 1.2 10*3/uL — ABNORMAL LOW (ref 1.5–6.5)
NEUT%: 38.2 % — ABNORMAL LOW (ref 39.0–75.0)
RDW: 12.1 % (ref 11.0–14.6)
lymph#: 1.1 10*3/uL (ref 0.9–3.3)

## 2009-09-23 ENCOUNTER — Ambulatory Visit: Payer: Self-pay | Admitting: Oncology

## 2009-09-25 LAB — CBC WITH DIFFERENTIAL/PLATELET
BASO%: 0.2 % (ref 0.0–2.0)
Eosinophils Absolute: 0 10*3/uL (ref 0.0–0.5)
LYMPH%: 32.8 % (ref 14.0–49.0)
MCHC: 34.5 g/dL (ref 32.0–36.0)
MONO#: 0.9 10*3/uL (ref 0.1–0.9)
MONO%: 22.7 % — ABNORMAL HIGH (ref 0.0–14.0)
NEUT#: 1.8 10*3/uL (ref 1.5–6.5)
Platelets: 21 10*3/uL — ABNORMAL LOW (ref 140–400)
RBC: 4.93 10*6/uL (ref 4.20–5.82)
RDW: 12.6 % (ref 11.0–14.6)
WBC: 4.1 10*3/uL (ref 4.0–10.3)
nRBC: 0 % (ref 0–0)

## 2009-10-01 LAB — CBC WITH DIFFERENTIAL/PLATELET
BASO%: 0.4 % (ref 0.0–2.0)
EOS%: 1.7 % (ref 0.0–7.0)
HCT: 43.5 % (ref 38.4–49.9)
HGB: 14.9 g/dL (ref 13.0–17.1)
MCH: 29.4 pg (ref 27.2–33.4)
MCHC: 34.3 g/dL (ref 32.0–36.0)
MONO#: 0.7 10*3/uL (ref 0.1–0.9)
RDW: 12.6 % (ref 11.0–14.6)
WBC: 2.4 10*3/uL — ABNORMAL LOW (ref 4.0–10.3)
lymph#: 1 10*3/uL (ref 0.9–3.3)

## 2009-10-01 LAB — COMPREHENSIVE METABOLIC PANEL
ALT: 24 U/L (ref 0–53)
AST: 20 U/L (ref 0–37)
Albumin: 4.1 g/dL (ref 3.5–5.2)
Alkaline Phosphatase: 48 U/L (ref 39–117)
Calcium: 8.9 mg/dL (ref 8.4–10.5)
Chloride: 108 mEq/L (ref 96–112)
Potassium: 3.9 mEq/L (ref 3.5–5.3)
Sodium: 143 mEq/L (ref 135–145)
Total Protein: 6.5 g/dL (ref 6.0–8.3)

## 2009-10-03 LAB — CBC WITH DIFFERENTIAL/PLATELET
EOS%: 1 % (ref 0.0–7.0)
Eosinophils Absolute: 0 10*3/uL (ref 0.0–0.5)
LYMPH%: 33 % (ref 14.0–49.0)
MCH: 30.1 pg (ref 27.2–33.4)
MCHC: 34.9 g/dL (ref 32.0–36.0)
MCV: 86.2 fL (ref 79.3–98.0)
MONO%: 19.8 % — ABNORMAL HIGH (ref 0.0–14.0)
Platelets: 19 10*3/uL — ABNORMAL LOW (ref 140–400)
RBC: 5.04 10*6/uL (ref 4.20–5.82)

## 2009-10-10 LAB — CBC WITH DIFFERENTIAL/PLATELET
BASO%: 0.6 % (ref 0.0–2.0)
Eosinophils Absolute: 0 10*3/uL (ref 0.0–0.5)
MCHC: 34.5 g/dL (ref 32.0–36.0)
MONO#: 0.8 10*3/uL (ref 0.1–0.9)
NEUT#: 1.1 10*3/uL — ABNORMAL LOW (ref 1.5–6.5)
RBC: 5.21 10*6/uL (ref 4.20–5.82)
RDW: 12.5 % (ref 11.0–14.6)
WBC: 3.1 10*3/uL — ABNORMAL LOW (ref 4.0–10.3)
lymph#: 1.1 10*3/uL (ref 0.9–3.3)
nRBC: 0 % (ref 0–0)

## 2009-10-17 LAB — CBC WITH DIFFERENTIAL/PLATELET
BASO%: 0.3 % (ref 0.0–2.0)
Basophils Absolute: 0 10*3/uL (ref 0.0–0.1)
EOS%: 0.7 % (ref 0.0–7.0)
HCT: 43.9 % (ref 38.4–49.9)
HGB: 15 g/dL (ref 13.0–17.1)
MONO#: 0.8 10*3/uL (ref 0.1–0.9)
NEUT%: 36.3 % — ABNORMAL LOW (ref 39.0–75.0)
RDW: 12.6 % (ref 11.0–14.6)
WBC: 2.9 10*3/uL — ABNORMAL LOW (ref 4.0–10.3)
lymph#: 1 10*3/uL (ref 0.9–3.3)

## 2009-10-24 ENCOUNTER — Ambulatory Visit: Payer: Self-pay | Admitting: Oncology

## 2009-10-24 LAB — CBC WITH DIFFERENTIAL/PLATELET
BASO%: 0.6 % (ref 0.0–2.0)
EOS%: 1.2 % (ref 0.0–7.0)
LYMPH%: 39.1 % (ref 14.0–49.0)
MCHC: 34.3 g/dL (ref 32.0–36.0)
MCV: 86.6 fL (ref 79.3–98.0)
MONO%: 25.5 % — ABNORMAL HIGH (ref 0.0–14.0)
Platelets: 56 10*3/uL — ABNORMAL LOW (ref 140–400)
RBC: 5.29 10*6/uL (ref 4.20–5.82)
nRBC: 0 % (ref 0–0)

## 2009-10-31 LAB — CBC WITH DIFFERENTIAL/PLATELET
BASO%: 0.3 % (ref 0.0–2.0)
EOS%: 0.3 % (ref 0.0–7.0)
HCT: 43.8 % (ref 38.4–49.9)
MCH: 29.6 pg (ref 27.2–33.4)
MCHC: 34 g/dL (ref 32.0–36.0)
NEUT%: 39.9 % (ref 39.0–75.0)
RBC: 5.04 10*6/uL (ref 4.20–5.82)
RDW: 12.4 % (ref 11.0–14.6)
lymph#: 1.1 10*3/uL (ref 0.9–3.3)
nRBC: 0 % (ref 0–0)

## 2009-11-04 LAB — CBC WITH DIFFERENTIAL/PLATELET
EOS%: 0.6 % (ref 0.0–7.0)
Eosinophils Absolute: 0 10*3/uL (ref 0.0–0.5)
MCV: 87.7 fL (ref 79.3–98.0)
MONO%: 19.8 % — ABNORMAL HIGH (ref 0.0–14.0)
NEUT#: 1.5 10*3/uL (ref 1.5–6.5)
RBC: 4.86 10*6/uL (ref 4.20–5.82)
RDW: 12.7 % (ref 11.0–14.6)
nRBC: 0 % (ref 0–0)

## 2009-11-07 LAB — CBC WITH DIFFERENTIAL/PLATELET
Basophils Absolute: 0 10*3/uL (ref 0.0–0.1)
EOS%: 1.1 % (ref 0.0–7.0)
Eosinophils Absolute: 0 10*3/uL (ref 0.0–0.5)
HCT: 45.9 % (ref 38.4–49.9)
LYMPH%: 34.7 % (ref 14.0–49.0)
MCHC: 33.8 g/dL (ref 32.0–36.0)
MONO%: 22.5 % — ABNORMAL HIGH (ref 0.0–14.0)
NEUT%: 41.3 % (ref 39.0–75.0)
RBC: 5.24 10*6/uL (ref 4.20–5.82)
WBC: 2.7 10*3/uL — ABNORMAL LOW (ref 4.0–10.3)
lymph#: 0.9 10*3/uL (ref 0.9–3.3)

## 2009-11-12 LAB — CBC WITH DIFFERENTIAL/PLATELET
BASO%: 0.4 % (ref 0.0–2.0)
EOS%: 0.8 % (ref 0.0–7.0)
HCT: 43.9 % (ref 38.4–49.9)
LYMPH%: 39.8 % (ref 14.0–49.0)
MCH: 29.8 pg (ref 27.2–33.4)
MCHC: 33.7 g/dL (ref 32.0–36.0)
MCV: 88.3 fL (ref 79.3–98.0)
MONO#: 0.6 10*3/uL (ref 0.1–0.9)
MONO%: 23.9 % — ABNORMAL HIGH (ref 0.0–14.0)
NEUT%: 35.1 % — ABNORMAL LOW (ref 39.0–75.0)
Platelets: 58 10*3/uL — ABNORMAL LOW (ref 140–400)
RBC: 4.97 10*6/uL (ref 4.20–5.82)
nRBC: 0 % (ref 0–0)

## 2009-11-21 LAB — CBC WITH DIFFERENTIAL/PLATELET
Eosinophils Absolute: 0 10*3/uL (ref 0.0–0.5)
HCT: 45.1 % (ref 38.4–49.9)
LYMPH%: 37.5 % (ref 14.0–49.0)
MCV: 87.9 fL (ref 79.3–98.0)
MONO%: 21.5 % — ABNORMAL HIGH (ref 0.0–14.0)
NEUT#: 1 10*3/uL — ABNORMAL LOW (ref 1.5–6.5)
NEUT%: 40.6 % (ref 39.0–75.0)
Platelets: 49 10*3/uL — ABNORMAL LOW (ref 140–400)
RBC: 5.13 10*6/uL (ref 4.20–5.82)

## 2009-11-26 ENCOUNTER — Ambulatory Visit: Payer: Self-pay | Admitting: Oncology

## 2009-11-28 LAB — CBC WITH DIFFERENTIAL/PLATELET
BASO%: 0.3 % (ref 0.0–2.0)
EOS%: 0.7 % (ref 0.0–7.0)
LYMPH%: 35.3 % (ref 14.0–49.0)
MCH: 29.7 pg (ref 27.2–33.4)
MCHC: 34 g/dL (ref 32.0–36.0)
MCV: 87.5 fL (ref 79.3–98.0)
MONO#: 0.8 10*3/uL (ref 0.1–0.9)
MONO%: 27.7 % — ABNORMAL HIGH (ref 0.0–14.0)
NEUT%: 36 % — ABNORMAL LOW (ref 39.0–75.0)
Platelets: 67 10*3/uL — ABNORMAL LOW (ref 140–400)
RBC: 5.18 10*6/uL (ref 4.20–5.82)
WBC: 2.9 10*3/uL — ABNORMAL LOW (ref 4.0–10.3)
nRBC: 0 % (ref 0–0)

## 2009-12-05 LAB — CBC WITH DIFFERENTIAL/PLATELET
EOS%: 1.1 % (ref 0.0–7.0)
LYMPH%: 38.4 % (ref 14.0–49.0)
MCH: 29.5 pg (ref 27.2–33.4)
MCHC: 34.1 g/dL (ref 32.0–36.0)
MCV: 86.7 fL (ref 79.3–98.0)
MONO%: 19.8 % — ABNORMAL HIGH (ref 0.0–14.0)
Platelets: 50 10*3/uL — ABNORMAL LOW (ref 140–400)
RBC: 5.25 10*6/uL (ref 4.20–5.82)
RDW: 12.2 % (ref 11.0–14.6)
nRBC: 0 % (ref 0–0)

## 2009-12-11 LAB — CBC WITH DIFFERENTIAL/PLATELET
BASO%: 0.3 % (ref 0.0–2.0)
EOS%: 0.7 % (ref 0.0–7.0)
LYMPH%: 35.4 % (ref 14.0–49.0)
MCH: 29.5 pg (ref 27.2–33.4)
MCHC: 33.5 g/dL (ref 32.0–36.0)
MONO#: 0.6 10*3/uL (ref 0.1–0.9)
NEUT%: 42.1 % (ref 39.0–75.0)
RBC: 5.11 10*6/uL (ref 4.20–5.82)
WBC: 2.9 10*3/uL — ABNORMAL LOW (ref 4.0–10.3)
lymph#: 1 10*3/uL (ref 0.9–3.3)
nRBC: 0 % (ref 0–0)

## 2009-12-18 LAB — CBC WITH DIFFERENTIAL/PLATELET
BASO%: 0.4 % (ref 0.0–2.0)
Basophils Absolute: 0 10*3/uL (ref 0.0–0.1)
EOS%: 0.8 % (ref 0.0–7.0)
HCT: 44 % (ref 38.4–49.9)
HGB: 14.9 g/dL (ref 13.0–17.1)
MCH: 29.5 pg (ref 27.2–33.4)
MCHC: 33.9 g/dL (ref 32.0–36.0)
MCV: 87.1 fL (ref 79.3–98.0)
MONO%: 17.4 % — ABNORMAL HIGH (ref 0.0–14.0)
NEUT%: 44.8 % (ref 39.0–75.0)
RDW: 12.3 % (ref 11.0–14.6)

## 2009-12-24 ENCOUNTER — Ambulatory Visit: Payer: Self-pay | Admitting: Oncology

## 2009-12-26 LAB — CBC WITH DIFFERENTIAL/PLATELET
BASO%: 0.5 % (ref 0.0–2.0)
Basophils Absolute: 0 10*3/uL (ref 0.0–0.1)
EOS%: 0.5 % (ref 0.0–7.0)
Eosinophils Absolute: 0 10*3/uL (ref 0.0–0.5)
HCT: 44.5 % (ref 38.4–49.9)
HGB: 15.1 g/dL (ref 13.0–17.1)
LYMPH%: 39.6 % (ref 14.0–49.0)
MCH: 29.4 pg (ref 27.2–33.4)
MCHC: 33.9 g/dL (ref 32.0–36.0)
MCV: 86.7 fL (ref 79.3–98.0)
MONO#: 0.4 10*3/uL (ref 0.1–0.9)
MONO%: 18.9 % — ABNORMAL HIGH (ref 0.0–14.0)
NEUT#: 0.9 10*3/uL — ABNORMAL LOW (ref 1.5–6.5)
NEUT%: 40.5 % (ref 39.0–75.0)
Platelets: 52 10*3/uL — ABNORMAL LOW (ref 140–400)
RBC: 5.13 10*6/uL (ref 4.20–5.82)
RDW: 12.2 % (ref 11.0–14.6)
WBC: 2.2 10*3/uL — ABNORMAL LOW (ref 4.0–10.3)
lymph#: 0.9 10*3/uL (ref 0.9–3.3)
nRBC: 0 % (ref 0–0)

## 2009-12-26 LAB — COMPREHENSIVE METABOLIC PANEL
ALT: 23 U/L (ref 0–53)
AST: 20 U/L (ref 0–37)
Alkaline Phosphatase: 47 U/L (ref 39–117)
CO2: 21 mEq/L (ref 19–32)
Sodium: 141 mEq/L (ref 135–145)
Total Bilirubin: 0.4 mg/dL (ref 0.3–1.2)
Total Protein: 6.2 g/dL (ref 6.0–8.3)

## 2009-12-26 LAB — LACTATE DEHYDROGENASE: LDH: 146 U/L (ref 94–250)

## 2010-01-02 LAB — CBC WITH DIFFERENTIAL/PLATELET
BASO%: 0.7 % (ref 0.0–2.0)
Basophils Absolute: 0 10e3/uL (ref 0.0–0.1)
EOS%: 0.7 % (ref 0.0–7.0)
Eosinophils Absolute: 0 10e3/uL (ref 0.0–0.5)
HCT: 46.6 % (ref 38.4–49.9)
HGB: 15.6 g/dL (ref 13.0–17.1)
LYMPH%: 30.9 % (ref 14.0–49.0)
MCH: 29.4 pg (ref 27.2–33.4)
MCHC: 33.5 g/dL (ref 32.0–36.0)
MCV: 87.9 fL (ref 79.3–98.0)
MONO#: 0.9 10e3/uL (ref 0.1–0.9)
MONO%: 27.7 % — ABNORMAL HIGH (ref 0.0–14.0)
NEUT#: 1.2 10e3/uL — ABNORMAL LOW (ref 1.5–6.5)
NEUT%: 40 % (ref 39.0–75.0)
Platelets: 62 10e3/uL — ABNORMAL LOW (ref 140–400)
RBC: 5.3 10e6/uL (ref 4.20–5.82)
RDW: 12.4 % (ref 11.0–14.6)
WBC: 3.1 10e3/uL — ABNORMAL LOW (ref 4.0–10.3)
lymph#: 1 10e3/uL (ref 0.9–3.3)
nRBC: 0 % (ref 0–0)

## 2010-01-09 LAB — CBC WITH DIFFERENTIAL/PLATELET
EOS%: 0.4 % (ref 0.0–7.0)
LYMPH%: 36.5 % (ref 14.0–49.0)
MCH: 29.5 pg (ref 27.2–33.4)
MCV: 87.7 fL (ref 79.3–98.0)
MONO%: 21.2 % — ABNORMAL HIGH (ref 0.0–14.0)
Platelets: 76 10*3/uL — ABNORMAL LOW (ref 140–400)
RBC: 5.22 10*6/uL (ref 4.20–5.82)
RDW: 12.3 % (ref 11.0–14.6)
nRBC: 0 % (ref 0–0)

## 2010-01-16 LAB — CBC WITH DIFFERENTIAL/PLATELET
BASO%: 0.4 % (ref 0.0–2.0)
EOS%: 0.8 % (ref 0.0–7.0)
HCT: 45.2 % (ref 38.4–49.9)
LYMPH%: 35.3 % (ref 14.0–49.0)
MCH: 29.2 pg (ref 27.2–33.4)
MCHC: 33.4 g/dL (ref 32.0–36.0)
MONO#: 0.5 10*3/uL (ref 0.1–0.9)
NEUT%: 42.6 % (ref 39.0–75.0)
Platelets: 63 10*3/uL — ABNORMAL LOW (ref 140–400)
RBC: 5.18 10*6/uL (ref 4.20–5.82)
WBC: 2.5 10*3/uL — ABNORMAL LOW (ref 4.0–10.3)
lymph#: 0.9 10*3/uL (ref 0.9–3.3)
nRBC: 0 % (ref 0–0)

## 2010-01-23 ENCOUNTER — Ambulatory Visit: Payer: Self-pay | Admitting: Oncology

## 2010-01-23 LAB — CBC WITH DIFFERENTIAL/PLATELET
BASO%: 0.4 % (ref 0.0–2.0)
Basophils Absolute: 0 10*3/uL (ref 0.0–0.1)
Eosinophils Absolute: 0 10*3/uL (ref 0.0–0.5)
HCT: 45.6 % (ref 38.4–49.9)
HGB: 15.3 g/dL (ref 13.0–17.1)
LYMPH%: 38.2 % (ref 14.0–49.0)
MCHC: 33.6 g/dL (ref 32.0–36.0)
MONO#: 0.7 10*3/uL (ref 0.1–0.9)
NEUT#: 1 10*3/uL — ABNORMAL LOW (ref 1.5–6.5)
NEUT%: 37.1 % — ABNORMAL LOW (ref 39.0–75.0)
Platelets: 61 10*3/uL — ABNORMAL LOW (ref 140–400)
WBC: 2.8 10*3/uL — ABNORMAL LOW (ref 4.0–10.3)
lymph#: 1.1 10*3/uL (ref 0.9–3.3)

## 2010-01-30 LAB — CBC WITH DIFFERENTIAL/PLATELET
Basophils Absolute: 0 10*3/uL (ref 0.0–0.1)
EOS%: 0.7 % (ref 0.0–7.0)
Eosinophils Absolute: 0 10*3/uL (ref 0.0–0.5)
HCT: 46.5 % (ref 38.4–49.9)
HGB: 15.7 g/dL (ref 13.0–17.1)
MCH: 29.3 pg (ref 27.2–33.4)
MCV: 86.9 fL (ref 79.3–98.0)
MONO%: 23 % — ABNORMAL HIGH (ref 0.0–14.0)
NEUT#: 1.2 10*3/uL — ABNORMAL LOW (ref 1.5–6.5)
NEUT%: 40.8 % (ref 39.0–75.0)
Platelets: 69 10*3/uL — ABNORMAL LOW (ref 140–400)
RDW: 12.5 % (ref 11.0–14.6)

## 2010-02-05 LAB — COMPREHENSIVE METABOLIC PANEL
BUN: 13 mg/dL (ref 6–23)
CO2: 30 mEq/L (ref 19–32)
Calcium: 8.9 mg/dL (ref 8.4–10.5)
Chloride: 104 mEq/L (ref 96–112)
Creatinine, Ser: 1.05 mg/dL (ref 0.40–1.50)
Total Bilirubin: 0.7 mg/dL (ref 0.3–1.2)

## 2010-02-05 LAB — CBC WITH DIFFERENTIAL/PLATELET
Eosinophils Absolute: 0 10*3/uL (ref 0.0–0.5)
HCT: 45.7 % (ref 38.4–49.9)
LYMPH%: 11.7 % — ABNORMAL LOW (ref 14.0–49.0)
MCHC: 33.9 g/dL (ref 32.0–36.0)
MCV: 87.2 fL (ref 79.3–98.0)
MONO#: 1.2 10*3/uL — ABNORMAL HIGH (ref 0.1–0.9)
MONO%: 36.2 % — ABNORMAL HIGH (ref 0.0–14.0)
NEUT%: 51.5 % (ref 39.0–75.0)
Platelets: 40 10*3/uL — ABNORMAL LOW (ref 140–400)
RBC: 5.24 10*6/uL (ref 4.20–5.82)
WBC: 3.3 10*3/uL — ABNORMAL LOW (ref 4.0–10.3)

## 2010-02-05 LAB — LACTATE DEHYDROGENASE: LDH: 151 U/L (ref 94–250)

## 2010-02-12 LAB — CBC WITH DIFFERENTIAL/PLATELET
Basophils Absolute: 0 10*3/uL (ref 0.0–0.1)
EOS%: 1.7 % (ref 0.0–7.0)
Eosinophils Absolute: 0.1 10*3/uL (ref 0.0–0.5)
HGB: 15.2 g/dL (ref 13.0–17.1)
LYMPH%: 28.7 % (ref 14.0–49.0)
MCH: 29.3 pg (ref 27.2–33.4)
MCV: 87.3 fL (ref 79.3–98.0)
MONO%: 16 % — ABNORMAL HIGH (ref 0.0–14.0)
NEUT#: 1.6 10*3/uL (ref 1.5–6.5)
Platelets: 144 10*3/uL (ref 140–400)
RDW: 12.2 % (ref 11.0–14.6)

## 2010-02-19 LAB — CBC WITH DIFFERENTIAL/PLATELET
Eosinophils Absolute: 0 10*3/uL (ref 0.0–0.5)
MCV: 87.2 fL (ref 79.3–98.0)
MONO%: 22.3 % — ABNORMAL HIGH (ref 0.0–14.0)
NEUT#: 1.6 10*3/uL (ref 1.5–6.5)
RBC: 5 10*6/uL (ref 4.20–5.82)
RDW: 12.4 % (ref 11.0–14.6)
WBC: 3.3 10*3/uL — ABNORMAL LOW (ref 4.0–10.3)
nRBC: 0 % (ref 0–0)

## 2010-02-19 LAB — TECHNOLOGIST REVIEW

## 2010-02-24 ENCOUNTER — Ambulatory Visit: Payer: Self-pay | Admitting: Oncology

## 2010-02-26 LAB — CBC WITH DIFFERENTIAL/PLATELET
BASO%: 0.3 % (ref 0.0–2.0)
Basophils Absolute: 0 10*3/uL (ref 0.0–0.1)
EOS%: 0.9 % (ref 0.0–7.0)
HGB: 15.3 g/dL (ref 13.0–17.1)
MCH: 29.5 pg (ref 27.2–33.4)
MCHC: 33.6 g/dL (ref 32.0–36.0)
RBC: 5.18 10*6/uL (ref 4.20–5.82)
RDW: 12.7 % (ref 11.0–14.6)
lymph#: 1.2 10*3/uL (ref 0.9–3.3)
nRBC: 0 % (ref 0–0)

## 2010-03-05 LAB — CBC WITH DIFFERENTIAL/PLATELET
BASO%: 0.4 % (ref 0.0–2.0)
Eosinophils Absolute: 0 10*3/uL (ref 0.0–0.5)
MONO#: 0.7 10*3/uL (ref 0.1–0.9)
NEUT#: 1 10*3/uL — ABNORMAL LOW (ref 1.5–6.5)
RBC: 5.08 10*6/uL (ref 4.20–5.82)
RDW: 12.7 % (ref 11.0–14.6)
WBC: 2.7 10*3/uL — ABNORMAL LOW (ref 4.0–10.3)
lymph#: 1 10*3/uL (ref 0.9–3.3)
nRBC: 0 % (ref 0–0)

## 2010-03-12 LAB — CBC WITH DIFFERENTIAL/PLATELET
BASO%: 0.7 % (ref 0.0–2.0)
Basophils Absolute: 0 10*3/uL (ref 0.0–0.1)
HCT: 45.5 % (ref 38.4–49.9)
HGB: 15.3 g/dL (ref 13.0–17.1)
MCHC: 33.6 g/dL (ref 32.0–36.0)
MONO#: 0.7 10*3/uL (ref 0.1–0.9)
NEUT%: 39.9 % (ref 39.0–75.0)
RDW: 12.7 % (ref 11.0–14.6)
WBC: 2.9 10*3/uL — ABNORMAL LOW (ref 4.0–10.3)
lymph#: 0.9 10*3/uL (ref 0.9–3.3)

## 2010-03-18 LAB — CBC WITH DIFFERENTIAL/PLATELET
Basophils Absolute: 0 10*3/uL (ref 0.0–0.1)
Eosinophils Absolute: 0 10*3/uL (ref 0.0–0.5)
HGB: 15.5 g/dL (ref 13.0–17.1)
MCV: 87.3 fL (ref 79.3–98.0)
MONO#: 0.8 10*3/uL (ref 0.1–0.9)
MONO%: 25.6 % — ABNORMAL HIGH (ref 0.0–14.0)
NEUT#: 1.3 10*3/uL — ABNORMAL LOW (ref 1.5–6.5)
RBC: 5.26 10*6/uL (ref 4.20–5.82)
RDW: 12.7 % (ref 11.0–14.6)
WBC: 3.1 10*3/uL — ABNORMAL LOW (ref 4.0–10.3)
nRBC: 0 % (ref 0–0)

## 2010-03-24 LAB — CBC WITH DIFFERENTIAL/PLATELET
BASO%: 0.4 % (ref 0.0–2.0)
EOS%: 1.2 % (ref 0.0–7.0)
HCT: 44.4 % (ref 38.4–49.9)
LYMPH%: 30 % (ref 14.0–49.0)
MCH: 29.7 pg (ref 27.2–33.4)
MCHC: 34 g/dL (ref 32.0–36.0)
MCV: 87.2 fL (ref 79.3–98.0)
NEUT%: 45.1 % (ref 39.0–75.0)
Platelets: 93 10*3/uL — ABNORMAL LOW (ref 140–400)

## 2010-03-31 ENCOUNTER — Ambulatory Visit: Payer: Self-pay | Admitting: Oncology

## 2010-04-02 LAB — CBC WITH DIFFERENTIAL/PLATELET
Basophils Absolute: 0 10*3/uL (ref 0.0–0.1)
EOS%: 1.5 % (ref 0.0–7.0)
LYMPH%: 31.5 % (ref 14.0–49.0)
MCH: 29.4 pg (ref 27.2–33.4)
MCV: 87 fL (ref 79.3–98.0)
MONO%: 20.5 % — ABNORMAL HIGH (ref 0.0–14.0)
RBC: 5.24 10*6/uL (ref 4.20–5.82)
RDW: 12.5 % (ref 11.0–14.6)
nRBC: 0 % (ref 0–0)

## 2010-04-09 LAB — CBC WITH DIFFERENTIAL/PLATELET
BASO%: 0.8 % (ref 0.0–2.0)
EOS%: 1.2 % (ref 0.0–7.0)
LYMPH%: 34.1 % (ref 14.0–49.0)
MCH: 29.3 pg (ref 27.2–33.4)
MCHC: 33.6 g/dL (ref 32.0–36.0)
MONO#: 0.5 10*3/uL (ref 0.1–0.9)
Platelets: 87 10*3/uL — ABNORMAL LOW (ref 140–400)
RBC: 5.26 10*6/uL (ref 4.20–5.82)
WBC: 2.5 10*3/uL — ABNORMAL LOW (ref 4.0–10.3)
nRBC: 0 % (ref 0–0)

## 2010-04-16 LAB — CBC WITH DIFFERENTIAL/PLATELET
BASO%: 0.3 % (ref 0.0–2.0)
EOS%: 0.6 % (ref 0.0–7.0)
HGB: 15.5 g/dL (ref 13.0–17.1)
MCH: 29.5 pg (ref 27.2–33.4)
MCHC: 33.8 g/dL (ref 32.0–36.0)
MCV: 87.4 fL (ref 79.3–98.0)
MONO%: 21.1 % — ABNORMAL HIGH (ref 0.0–14.0)
RBC: 5.25 10*6/uL (ref 4.20–5.82)
RDW: 12.7 % (ref 11.0–14.6)
lymph#: 1.2 10*3/uL (ref 0.9–3.3)
nRBC: 0 % (ref 0–0)

## 2010-04-23 LAB — CBC WITH DIFFERENTIAL/PLATELET
Basophils Absolute: 0 10*3/uL (ref 0.0–0.1)
EOS%: 0.7 % (ref 0.0–7.0)
HGB: 15 g/dL (ref 13.0–17.1)
LYMPH%: 33.2 % (ref 14.0–49.0)
MCH: 29.2 pg (ref 27.2–33.4)
MCV: 86.4 fL (ref 79.3–98.0)
MONO%: 23.7 % — ABNORMAL HIGH (ref 0.0–14.0)
RBC: 5.13 10*6/uL (ref 4.20–5.82)
RDW: 12.6 % (ref 11.0–14.6)

## 2010-04-23 LAB — TECHNOLOGIST REVIEW

## 2010-04-30 LAB — CBC WITH DIFFERENTIAL/PLATELET
BASO%: 0 % (ref 0.0–2.0)
EOS%: 1.4 % (ref 0.0–7.0)
MCH: 29.4 pg (ref 27.2–33.4)
MCV: 87.1 fL (ref 79.3–98.0)
MONO%: 20.1 % — ABNORMAL HIGH (ref 0.0–14.0)
RBC: 5.2 10*6/uL (ref 4.20–5.82)
RDW: 12.7 % (ref 11.0–14.6)
nRBC: 0 % (ref 0–0)

## 2010-05-06 ENCOUNTER — Ambulatory Visit: Payer: Self-pay | Admitting: Oncology

## 2010-05-07 LAB — CBC WITH DIFFERENTIAL/PLATELET
BASO%: 0.4 % (ref 0.0–2.0)
LYMPH%: 33.7 % (ref 14.0–49.0)
MCHC: 33.9 g/dL (ref 32.0–36.0)
MONO#: 0.7 10*3/uL (ref 0.1–0.9)
RBC: 5.01 10*6/uL (ref 4.20–5.82)
RDW: 12.7 % (ref 11.0–14.6)
WBC: 2.9 10*3/uL — ABNORMAL LOW (ref 4.0–10.3)
lymph#: 1 10*3/uL (ref 0.9–3.3)
nRBC: 0 % (ref 0–0)

## 2010-05-15 LAB — COMPREHENSIVE METABOLIC PANEL
ALT: 24 U/L (ref 0–53)
AST: 21 U/L (ref 0–37)
Alkaline Phosphatase: 42 U/L (ref 39–117)
CO2: 25 mEq/L (ref 19–32)
Sodium: 139 mEq/L (ref 135–145)
Total Bilirubin: 0.5 mg/dL (ref 0.3–1.2)
Total Protein: 6.3 g/dL (ref 6.0–8.3)

## 2010-05-15 LAB — CBC WITH DIFFERENTIAL/PLATELET
Eosinophils Absolute: 0 10*3/uL (ref 0.0–0.5)
MCV: 86.6 fL (ref 79.3–98.0)
MONO%: 23.6 % — ABNORMAL HIGH (ref 0.0–14.0)
NEUT#: 1.3 10*3/uL — ABNORMAL LOW (ref 1.5–6.5)
RBC: 5.08 10*6/uL (ref 4.20–5.82)
RDW: 12.6 % (ref 11.0–14.6)
WBC: 3.3 10*3/uL — ABNORMAL LOW (ref 4.0–10.3)
nRBC: 0 % (ref 0–0)

## 2010-05-22 LAB — CBC WITH DIFFERENTIAL/PLATELET
Basophils Absolute: 0 10*3/uL (ref 0.0–0.1)
Eosinophils Absolute: 0 10*3/uL (ref 0.0–0.5)
HCT: 43 % (ref 38.4–49.9)
HGB: 14.6 g/dL (ref 13.0–17.1)
MONO#: 0.5 10*3/uL (ref 0.1–0.9)
NEUT#: 1.2 10*3/uL — ABNORMAL LOW (ref 1.5–6.5)
RDW: 12.7 % (ref 11.0–14.6)
lymph#: 0.9 10*3/uL (ref 0.9–3.3)

## 2010-05-28 LAB — CBC WITH DIFFERENTIAL/PLATELET
Basophils Absolute: 0 10*3/uL (ref 0.0–0.1)
Eosinophils Absolute: 0 10*3/uL (ref 0.0–0.5)
HCT: 45.1 % (ref 38.4–49.9)
HGB: 15.3 g/dL (ref 13.0–17.1)
LYMPH%: 29.9 % (ref 14.0–49.0)
MONO#: 0.7 10*3/uL (ref 0.1–0.9)
NEUT#: 1.6 10*3/uL (ref 1.5–6.5)
NEUT%: 47.4 % (ref 39.0–75.0)
Platelets: 109 10*3/uL — ABNORMAL LOW (ref 140–400)
RBC: 5.22 10*6/uL (ref 4.20–5.82)
WBC: 3.4 10*3/uL — ABNORMAL LOW (ref 4.0–10.3)
nRBC: 0 % (ref 0–0)

## 2010-06-04 LAB — CBC WITH DIFFERENTIAL/PLATELET
Basophils Absolute: 0 10*3/uL (ref 0.0–0.1)
EOS%: 0.6 % (ref 0.0–7.0)
HCT: 44 % (ref 38.4–49.9)
HGB: 15 g/dL (ref 13.0–17.1)
MCH: 29.4 pg (ref 27.2–33.4)
NEUT%: 41.7 % (ref 39.0–75.0)
lymph#: 0.9 10*3/uL (ref 0.9–3.3)

## 2010-06-09 ENCOUNTER — Ambulatory Visit: Payer: Self-pay | Admitting: Oncology

## 2010-06-11 LAB — CBC WITH DIFFERENTIAL/PLATELET
Basophils Absolute: 0 10*3/uL (ref 0.0–0.1)
Eosinophils Absolute: 0 10*3/uL (ref 0.0–0.5)
HGB: 15.1 g/dL (ref 13.0–17.1)
LYMPH%: 31.6 % (ref 14.0–49.0)
MCV: 86 fL (ref 79.3–98.0)
MONO%: 19.5 % — ABNORMAL HIGH (ref 0.0–14.0)
NEUT#: 1.7 10*3/uL (ref 1.5–6.5)
Platelets: 91 10*3/uL — ABNORMAL LOW (ref 140–400)
RBC: 5.21 10*6/uL (ref 4.20–5.82)

## 2010-06-18 LAB — CBC WITH DIFFERENTIAL/PLATELET
BASO%: 0.5 % (ref 0.0–2.0)
EOS%: 0.5 % (ref 0.0–7.0)
HCT: 44.8 % (ref 38.4–49.9)
LYMPH%: 26.5 % (ref 14.0–49.0)
MCH: 29.2 pg (ref 27.2–33.4)
MCHC: 33.7 g/dL (ref 32.0–36.0)
NEUT%: 50.2 % (ref 39.0–75.0)
Platelets: 91 10*3/uL — ABNORMAL LOW (ref 140–400)

## 2010-06-25 LAB — CBC WITH DIFFERENTIAL/PLATELET
BASO%: 0.6 % (ref 0.0–2.0)
Basophils Absolute: 0 10*3/uL (ref 0.0–0.1)
EOS%: 0.9 % (ref 0.0–7.0)
HGB: 14.8 g/dL (ref 13.0–17.1)
MCH: 29 pg (ref 27.2–33.4)
MCV: 86.3 fL (ref 79.3–98.0)
MONO%: 24 % — ABNORMAL HIGH (ref 0.0–14.0)
RBC: 5.1 10*6/uL (ref 4.20–5.82)
RDW: 12.7 % (ref 11.0–14.6)
lymph#: 1.1 10*3/uL (ref 0.9–3.3)
nRBC: 0 % (ref 0–0)

## 2010-07-02 LAB — CBC WITH DIFFERENTIAL/PLATELET
BASO%: 0.6 % (ref 0.0–2.0)
Eosinophils Absolute: 0 10*3/uL (ref 0.0–0.5)
LYMPH%: 29.1 % (ref 14.0–49.0)
MCHC: 33.7 g/dL (ref 32.0–36.0)
MONO#: 0.9 10*3/uL (ref 0.1–0.9)
NEUT#: 1.6 10*3/uL (ref 1.5–6.5)
Platelets: 82 10*3/uL — ABNORMAL LOW (ref 140–400)
RBC: 5.22 10*6/uL (ref 4.20–5.82)
RDW: 12.8 % (ref 11.0–14.6)
WBC: 3.5 10*3/uL — ABNORMAL LOW (ref 4.0–10.3)
lymph#: 1 10*3/uL (ref 0.9–3.3)
nRBC: 0 % (ref 0–0)

## 2010-07-09 ENCOUNTER — Ambulatory Visit: Payer: Self-pay | Admitting: Oncology

## 2010-07-09 LAB — CBC WITH DIFFERENTIAL/PLATELET
Basophils Absolute: 0 10*3/uL (ref 0.0–0.1)
EOS%: 1.2 % (ref 0.0–7.0)
HCT: 45.1 % (ref 38.4–49.9)
HGB: 15.2 g/dL (ref 13.0–17.1)
MCH: 29.4 pg (ref 27.2–33.4)
MONO#: 0.9 10*3/uL (ref 0.1–0.9)
NEUT#: 1.4 10*3/uL — ABNORMAL LOW (ref 1.5–6.5)
NEUT%: 40.2 % (ref 39.0–75.0)
RDW: 12.7 % (ref 11.0–14.6)
WBC: 3.5 10*3/uL — ABNORMAL LOW (ref 4.0–10.3)
lymph#: 1.1 10*3/uL (ref 0.9–3.3)

## 2010-07-16 LAB — CBC WITH DIFFERENTIAL/PLATELET
Basophils Absolute: 0 10*3/uL (ref 0.0–0.1)
Eosinophils Absolute: 0 10*3/uL (ref 0.0–0.5)
HGB: 14.8 g/dL (ref 13.0–17.1)
MONO#: 0.7 10*3/uL (ref 0.1–0.9)
NEUT#: 1.3 10*3/uL — ABNORMAL LOW (ref 1.5–6.5)
Platelets: 80 10*3/uL — ABNORMAL LOW (ref 140–400)
RBC: 5.05 10*6/uL (ref 4.20–5.82)
RDW: 12.7 % (ref 11.0–14.6)
WBC: 2.9 10*3/uL — ABNORMAL LOW (ref 4.0–10.3)
nRBC: 0 % (ref 0–0)

## 2010-07-16 LAB — TECHNOLOGIST REVIEW

## 2010-07-23 LAB — CBC WITH DIFFERENTIAL/PLATELET
HCT: 43.6 % (ref 38.4–49.9)
LYMPH%: 30.5 % (ref 14.0–49.0)
MCH: 28.9 pg (ref 27.2–33.4)
MCHC: 33 g/dL (ref 32.0–36.0)
MONO%: 26.4 % — ABNORMAL HIGH (ref 0.0–14.0)
RBC: 4.98 10*6/uL (ref 4.20–5.82)
RDW: 12.8 % (ref 11.0–14.6)
WBC: 3.7 10*3/uL — ABNORMAL LOW (ref 4.0–10.3)
lymph#: 1.1 10*3/uL (ref 0.9–3.3)

## 2010-07-29 LAB — CBC WITH DIFFERENTIAL/PLATELET
BASO%: 0.3 % (ref 0.0–2.0)
Basophils Absolute: 0 10*3/uL (ref 0.0–0.1)
HCT: 43 % (ref 38.4–49.9)
HGB: 14.4 g/dL (ref 13.0–17.1)
LYMPH%: 31.1 % (ref 14.0–49.0)
MCH: 29.4 pg (ref 27.2–33.4)
MCHC: 33.5 g/dL (ref 32.0–36.0)
MONO#: 0.8 10*3/uL (ref 0.1–0.9)
NEUT%: 46.7 % (ref 39.0–75.0)
Platelets: 82 10*3/uL — ABNORMAL LOW (ref 140–400)
WBC: 3.6 10*3/uL — ABNORMAL LOW (ref 4.0–10.3)

## 2010-08-06 LAB — CBC WITH DIFFERENTIAL/PLATELET
Basophils Absolute: 0 10*3/uL (ref 0.0–0.1)
EOS%: 0.8 % (ref 0.0–7.0)
Eosinophils Absolute: 0 10*3/uL (ref 0.0–0.5)
HGB: 15.1 g/dL (ref 13.0–17.1)
LYMPH%: 25.8 % (ref 14.0–49.0)
MCH: 29.6 pg (ref 27.2–33.4)
MCV: 87.5 fL (ref 79.3–98.0)
MONO%: 25.8 % — ABNORMAL HIGH (ref 0.0–14.0)
NEUT#: 1.7 10*3/uL (ref 1.5–6.5)
NEUT%: 47.3 % (ref 39.0–75.0)
Platelets: 95 10*3/uL — ABNORMAL LOW (ref 140–400)
RDW: 12.8 % (ref 11.0–14.6)

## 2010-08-13 ENCOUNTER — Ambulatory Visit: Payer: Self-pay | Admitting: Oncology

## 2010-08-13 LAB — CBC WITH DIFFERENTIAL/PLATELET
BASO%: 0.6 % (ref 0.0–2.0)
HCT: 43 % (ref 38.4–49.9)
LYMPH%: 29.5 % (ref 14.0–49.0)
MCH: 30 pg (ref 27.2–33.4)
MCHC: 34.5 g/dL (ref 32.0–36.0)
MCV: 87.2 fL (ref 79.3–98.0)
MONO#: 0.6 10*3/uL (ref 0.1–0.9)
MONO%: 17.6 % — ABNORMAL HIGH (ref 0.0–14.0)
NEUT%: 49.6 % (ref 39.0–75.0)
Platelets: 112 10*3/uL — ABNORMAL LOW (ref 140–400)
RBC: 4.93 10*6/uL (ref 4.20–5.82)

## 2010-08-20 LAB — CBC WITH DIFFERENTIAL/PLATELET
Basophils Absolute: 0 10*3/uL (ref 0.0–0.1)
EOS%: 1.3 % (ref 0.0–7.0)
Eosinophils Absolute: 0.1 10*3/uL (ref 0.0–0.5)
HGB: 14.2 g/dL (ref 13.0–17.1)
MONO#: 0.9 10*3/uL (ref 0.1–0.9)
NEUT#: 2.5 10*3/uL (ref 1.5–6.5)
RDW: 12.6 % (ref 11.0–14.6)
lymph#: 1.1 10*3/uL (ref 0.9–3.3)

## 2010-08-27 LAB — CBC WITH DIFFERENTIAL/PLATELET
Basophils Absolute: 0 10*3/uL (ref 0.0–0.1)
Eosinophils Absolute: 0 10*3/uL (ref 0.0–0.5)
HCT: 43.7 % (ref 38.4–49.9)
HGB: 14.6 g/dL (ref 13.0–17.1)
LYMPH%: 32.1 % (ref 14.0–49.0)
MONO#: 0.8 10*3/uL (ref 0.1–0.9)
NEUT#: 1.6 10*3/uL (ref 1.5–6.5)
NEUT%: 44.9 % (ref 39.0–75.0)
Platelets: 85 10*3/uL — ABNORMAL LOW (ref 140–400)
WBC: 3.6 10*3/uL — ABNORMAL LOW (ref 4.0–10.3)
lymph#: 1.2 10*3/uL (ref 0.9–3.3)

## 2010-09-03 LAB — CBC WITH DIFFERENTIAL/PLATELET
Basophils Absolute: 0 10*3/uL (ref 0.0–0.1)
EOS%: 1 % (ref 0.0–7.0)
Eosinophils Absolute: 0 10*3/uL (ref 0.0–0.5)
HCT: 42.8 % (ref 38.4–49.9)
HGB: 14.2 g/dL (ref 13.0–17.1)
MCH: 29 pg (ref 27.2–33.4)
MCV: 87.5 fL (ref 79.3–98.0)
MONO%: 22.8 % — ABNORMAL HIGH (ref 0.0–14.0)
NEUT#: 1.2 10*3/uL — ABNORMAL LOW (ref 1.5–6.5)
NEUT%: 41.3 % (ref 39.0–75.0)
RDW: 13 % (ref 11.0–14.6)

## 2010-09-10 LAB — CBC WITH DIFFERENTIAL/PLATELET
Eosinophils Absolute: 0 10*3/uL (ref 0.0–0.5)
HCT: 43.6 % (ref 38.4–49.9)
LYMPH%: 32.1 % (ref 14.0–49.0)
MCV: 88.6 fL (ref 79.3–98.0)
MONO#: 0.7 10*3/uL (ref 0.1–0.9)
MONO%: 26.7 % — ABNORMAL HIGH (ref 0.0–14.0)
NEUT#: 1.1 10*3/uL — ABNORMAL LOW (ref 1.5–6.5)
NEUT%: 39.4 % (ref 39.0–75.0)
Platelets: 81 10*3/uL — ABNORMAL LOW (ref 140–400)
RBC: 4.92 10*6/uL (ref 4.20–5.82)
WBC: 2.8 10*3/uL — ABNORMAL LOW (ref 4.0–10.3)

## 2010-09-14 ENCOUNTER — Ambulatory Visit: Payer: Self-pay | Admitting: Oncology

## 2010-09-16 LAB — CBC WITH DIFFERENTIAL/PLATELET
BASO%: 0.9 % (ref 0.0–2.0)
EOS%: 2.2 % (ref 0.0–7.0)
HCT: 44.5 % (ref 38.4–49.9)
LYMPH%: 37.7 % (ref 14.0–49.0)
MCH: 29.8 pg (ref 27.2–33.4)
MCHC: 33.9 g/dL (ref 32.0–36.0)
MCV: 87.8 fL (ref 79.3–98.0)
MONO%: 21.1 % — ABNORMAL HIGH (ref 0.0–14.0)
NEUT%: 38.1 % — ABNORMAL LOW (ref 39.0–75.0)
Platelets: 83 10*3/uL — ABNORMAL LOW (ref 140–400)
RBC: 5.07 10*6/uL (ref 4.20–5.82)

## 2010-09-24 LAB — CBC WITH DIFFERENTIAL/PLATELET
BASO%: 0.4 % (ref 0.0–2.0)
EOS%: 1.1 % (ref 0.0–7.0)
HGB: 14.8 g/dL (ref 13.0–17.1)
MCH: 29.6 pg (ref 27.2–33.4)
MCV: 88 fL (ref 79.3–98.0)
MONO%: 20.1 % — ABNORMAL HIGH (ref 0.0–14.0)
RBC: 5 10*6/uL (ref 4.20–5.82)
RDW: 12.9 % (ref 11.0–14.6)
lymph#: 1.1 10*3/uL (ref 0.9–3.3)
nRBC: 0 % (ref 0–0)

## 2010-10-01 LAB — CBC WITH DIFFERENTIAL/PLATELET
BASO%: 0.3 % (ref 0.0–2.0)
Eosinophils Absolute: 0 10*3/uL (ref 0.0–0.5)
LYMPH%: 33.4 % (ref 14.0–49.0)
MCHC: 33.9 g/dL (ref 32.0–36.0)
MONO#: 0.9 10*3/uL (ref 0.1–0.9)
NEUT#: 1 10*3/uL — ABNORMAL LOW (ref 1.5–6.5)
Platelets: 86 10*3/uL — ABNORMAL LOW (ref 140–400)
RBC: 5.03 10*6/uL (ref 4.20–5.82)
RDW: 12.7 % (ref 11.0–14.6)
WBC: 2.9 10*3/uL — ABNORMAL LOW (ref 4.0–10.3)
lymph#: 1 10*3/uL (ref 0.9–3.3)
nRBC: 0 % (ref 0–0)

## 2010-10-01 LAB — TECHNOLOGIST REVIEW

## 2010-10-08 LAB — CBC WITH DIFFERENTIAL/PLATELET
EOS%: 1.1 % (ref 0.0–7.0)
Eosinophils Absolute: 0 10*3/uL (ref 0.0–0.5)
MCV: 88 fL (ref 79.3–98.0)
MONO%: 22.1 % — ABNORMAL HIGH (ref 0.0–14.0)
NEUT#: 1.2 10*3/uL — ABNORMAL LOW (ref 1.5–6.5)
RBC: 5.07 10*6/uL (ref 4.20–5.82)
RDW: 12.6 % (ref 11.0–14.6)
lymph#: 0.9 10*3/uL (ref 0.9–3.3)
nRBC: 0 % (ref 0–0)

## 2010-10-13 ENCOUNTER — Ambulatory Visit: Payer: Self-pay | Admitting: Oncology

## 2010-10-15 LAB — CBC WITH DIFFERENTIAL/PLATELET
BASO%: 0.3 % (ref 0.0–2.0)
HCT: 45.1 % (ref 38.4–49.9)
MCHC: 33.7 g/dL (ref 32.0–36.0)
MONO#: 0.6 10*3/uL (ref 0.1–0.9)
NEUT#: 1.3 10*3/uL — ABNORMAL LOW (ref 1.5–6.5)
RBC: 5.12 10*6/uL (ref 4.20–5.82)
WBC: 3.1 10*3/uL — ABNORMAL LOW (ref 4.0–10.3)
lymph#: 1.1 10*3/uL (ref 0.9–3.3)
nRBC: 0 % (ref 0–0)

## 2010-10-22 LAB — CBC WITH DIFFERENTIAL/PLATELET
BASO%: 0.4 % (ref 0.0–2.0)
HCT: 45.3 % (ref 38.4–49.9)
MCHC: 33.6 g/dL (ref 32.0–36.0)
MONO#: 0.6 10*3/uL (ref 0.1–0.9)
NEUT%: 45.8 % (ref 39.0–75.0)
WBC: 2.7 10*3/uL — ABNORMAL LOW (ref 4.0–10.3)
lymph#: 0.9 10*3/uL (ref 0.9–3.3)
nRBC: 0 % (ref 0–0)

## 2010-10-29 LAB — CBC WITH DIFFERENTIAL/PLATELET
BASO%: 0.5 % (ref 0.0–2.0)
Basophils Absolute: 0 10*3/uL (ref 0.0–0.1)
HCT: 44.8 % (ref 38.4–49.9)
LYMPH%: 33.6 % (ref 14.0–49.0)
MCH: 29.6 pg (ref 27.2–33.4)
MCHC: 33.7 g/dL (ref 32.0–36.0)
MONO#: 0.9 10*3/uL (ref 0.1–0.9)
NEUT%: 42.6 % (ref 39.0–75.0)
Platelets: 60 10*3/uL — ABNORMAL LOW (ref 140–400)

## 2010-11-05 LAB — CBC WITH DIFFERENTIAL/PLATELET
Basophils Absolute: 0.1 10*3/uL (ref 0.0–0.1)
Eosinophils Absolute: 0 10*3/uL (ref 0.0–0.5)
HGB: 15.2 g/dL (ref 13.0–17.1)
MCV: 88.4 fL (ref 79.3–98.0)
NEUT#: 2.8 10*3/uL (ref 1.5–6.5)
RDW: 12.8 % (ref 11.0–14.6)
lymph#: 1.1 10*3/uL (ref 0.9–3.3)

## 2010-11-09 ENCOUNTER — Ambulatory Visit: Payer: Self-pay | Admitting: Oncology

## 2010-11-11 LAB — CBC WITH DIFFERENTIAL/PLATELET
Basophils Absolute: 0 10*3/uL (ref 0.0–0.1)
Eosinophils Absolute: 0 10*3/uL (ref 0.0–0.5)
HCT: 44.5 % (ref 38.4–49.9)
HGB: 15.2 g/dL (ref 13.0–17.1)
LYMPH%: 31.4 % (ref 14.0–49.0)
MCV: 86.6 fL (ref 79.3–98.0)
MONO%: 21.3 % — ABNORMAL HIGH (ref 0.0–14.0)
NEUT#: 1.9 10*3/uL (ref 1.5–6.5)
Platelets: 91 10*3/uL — ABNORMAL LOW (ref 140–400)

## 2010-11-20 LAB — CBC WITH DIFFERENTIAL/PLATELET
BASO%: 0.6 % (ref 0.0–2.0)
EOS%: 0.9 % (ref 0.0–7.0)
HCT: 44.6 % (ref 38.4–49.9)
LYMPH%: 31.5 % (ref 14.0–49.0)
MCH: 29.4 pg (ref 27.2–33.4)
MCHC: 33.9 g/dL (ref 32.0–36.0)
MCV: 86.9 fL (ref 79.3–98.0)
MONO#: 0.8 10*3/uL (ref 0.1–0.9)
MONO%: 24.5 % — ABNORMAL HIGH (ref 0.0–14.0)
NEUT%: 42.5 % (ref 39.0–75.0)
Platelets: 77 10*3/uL — ABNORMAL LOW (ref 140–400)
RBC: 5.13 10*6/uL (ref 4.20–5.82)
WBC: 3.3 10*3/uL — ABNORMAL LOW (ref 4.0–10.3)
nRBC: 0 % (ref 0–0)

## 2010-11-26 LAB — CBC WITH DIFFERENTIAL/PLATELET
BASO%: 0.4 % (ref 0.0–2.0)
Basophils Absolute: 0 10*3/uL (ref 0.0–0.1)
EOS%: 0.7 % (ref 0.0–7.0)
HGB: 14.9 g/dL (ref 13.0–17.1)
MCH: 29.3 pg (ref 27.2–33.4)
MCHC: 33.8 g/dL (ref 32.0–36.0)
MCV: 86.6 fL (ref 79.3–98.0)
MONO%: 20.9 % — ABNORMAL HIGH (ref 0.0–14.0)
RDW: 12.3 % (ref 11.0–14.6)
lymph#: 0.9 10*3/uL (ref 0.9–3.3)

## 2010-12-03 LAB — CBC WITH DIFFERENTIAL/PLATELET
Eosinophils Absolute: 0 10*3/uL (ref 0.0–0.5)
MONO#: 0.6 10*3/uL (ref 0.1–0.9)
MONO%: 24.1 % — ABNORMAL HIGH (ref 0.0–14.0)
NEUT#: 1 10*3/uL — ABNORMAL LOW (ref 1.5–6.5)
RBC: 5.03 10*6/uL (ref 4.20–5.82)
RDW: 12.4 % (ref 11.0–14.6)
WBC: 2.6 10*3/uL — ABNORMAL LOW (ref 4.0–10.3)
lymph#: 1 10*3/uL (ref 0.9–3.3)
nRBC: 0 % (ref 0–0)

## 2010-12-09 ENCOUNTER — Ambulatory Visit: Payer: Self-pay | Admitting: Oncology

## 2010-12-11 LAB — CBC WITH DIFFERENTIAL/PLATELET
BASO%: 0.4 % (ref 0.0–2.0)
LYMPH%: 34.6 % (ref 14.0–49.0)
MCHC: 33.9 g/dL (ref 32.0–36.0)
MCV: 86.4 fL (ref 79.3–98.0)
MONO#: 0.6 10*3/uL (ref 0.1–0.9)
MONO%: 20.8 % — ABNORMAL HIGH (ref 0.0–14.0)
Platelets: 51 10*3/uL — ABNORMAL LOW (ref 140–400)
RBC: 5.09 10*6/uL (ref 4.20–5.82)
RDW: 12.4 % (ref 11.0–14.6)
WBC: 2.8 10*3/uL — ABNORMAL LOW (ref 4.0–10.3)
nRBC: 0 % (ref 0–0)

## 2010-12-17 LAB — CBC WITH DIFFERENTIAL/PLATELET
BASO%: 0.7 % (ref 0.0–2.0)
EOS%: 1.1 % (ref 0.0–7.0)
MCH: 29.5 pg (ref 27.2–33.4)
MCHC: 33.7 g/dL (ref 32.0–36.0)
RDW: 12.5 % (ref 11.0–14.6)
WBC: 2.7 10*3/uL — ABNORMAL LOW (ref 4.0–10.3)
lymph#: 0.9 10*3/uL (ref 0.9–3.3)
nRBC: 0 % (ref 0–0)

## 2010-12-24 ENCOUNTER — Ambulatory Visit (HOSPITAL_BASED_OUTPATIENT_CLINIC_OR_DEPARTMENT_OTHER): Payer: Medicare Other | Admitting: Oncology

## 2010-12-24 LAB — CBC WITH DIFFERENTIAL/PLATELET
BASO%: 0.3 % (ref 0.0–2.0)
Basophils Absolute: 0 10*3/uL (ref 0.0–0.1)
EOS%: 0.9 % (ref 0.0–7.0)
Eosinophils Absolute: 0 10*3/uL (ref 0.0–0.5)
HCT: 45.2 % (ref 38.4–49.9)
HGB: 15.2 g/dL (ref 13.0–17.1)
LYMPH%: 31.8 % (ref 14.0–49.0)
MCH: 29.4 pg (ref 27.2–33.4)
MCHC: 33.6 g/dL (ref 32.0–36.0)
MCV: 87.4 fL (ref 79.3–98.0)
MONO#: 0.9 10*3/uL (ref 0.1–0.9)
MONO%: 26.8 % — ABNORMAL HIGH (ref 0.0–14.0)
NEUT#: 1.4 10*3/uL — ABNORMAL LOW (ref 1.5–6.5)
NEUT%: 40.2 % (ref 39.0–75.0)
Platelets: 107 10*3/uL — ABNORMAL LOW (ref 140–400)
RBC: 5.17 10*6/uL (ref 4.20–5.82)
RDW: 12.3 % (ref 11.0–14.6)
WBC: 3.4 10*3/uL — ABNORMAL LOW (ref 4.0–10.3)
lymph#: 1.1 10*3/uL (ref 0.9–3.3)
nRBC: 0 % (ref 0–0)

## 2010-12-31 LAB — CBC WITH DIFFERENTIAL/PLATELET
BASO%: 0.3 % (ref 0.0–2.0)
Basophils Absolute: 0 10*3/uL (ref 0.0–0.1)
EOS%: 1 % (ref 0.0–7.0)
Eosinophils Absolute: 0 10*3/uL (ref 0.0–0.5)
HCT: 45.5 % (ref 38.4–49.9)
HGB: 15.5 g/dL (ref 13.0–17.1)
LYMPH%: 36.4 % (ref 14.0–49.0)
MCH: 29.8 pg (ref 27.2–33.4)
MCHC: 34.1 g/dL (ref 32.0–36.0)
MCV: 87.3 fL (ref 79.3–98.0)
MONO#: 0.6 10*3/uL (ref 0.1–0.9)
MONO%: 20.5 % — ABNORMAL HIGH (ref 0.0–14.0)
NEUT#: 1.3 10*3/uL — ABNORMAL LOW (ref 1.5–6.5)
NEUT%: 41.8 % (ref 39.0–75.0)
Platelets: 94 10*3/uL — ABNORMAL LOW (ref 140–400)
RBC: 5.21 10*6/uL (ref 4.20–5.82)
RDW: 12.3 % (ref 11.0–14.6)
WBC: 3.1 10*3/uL — ABNORMAL LOW (ref 4.0–10.3)
lymph#: 1.1 10*3/uL (ref 0.9–3.3)
nRBC: 1 % — ABNORMAL HIGH (ref 0–0)

## 2011-01-07 LAB — CBC WITH DIFFERENTIAL/PLATELET
BASO%: 0.3 % (ref 0.0–2.0)
Basophils Absolute: 0 10*3/uL (ref 0.0–0.1)
EOS%: 0.7 % (ref 0.0–7.0)
Eosinophils Absolute: 0 10*3/uL (ref 0.0–0.5)
HCT: 44.6 % (ref 38.4–49.9)
HGB: 15.2 g/dL (ref 13.0–17.1)
LYMPH%: 34.8 % (ref 14.0–49.0)
MCH: 29.5 pg (ref 27.2–33.4)
MCHC: 34.1 g/dL (ref 32.0–36.0)
MCV: 86.6 fL (ref 79.3–98.0)
MONO#: 0.6 10*3/uL (ref 0.1–0.9)
MONO%: 21.6 % — ABNORMAL HIGH (ref 0.0–14.0)
NEUT#: 1.2 10*3/uL — ABNORMAL LOW (ref 1.5–6.5)
NEUT%: 42.6 % (ref 39.0–75.0)
Platelets: 91 10*3/uL — ABNORMAL LOW (ref 140–400)
RBC: 5.15 10*6/uL (ref 4.20–5.82)
RDW: 12.3 % (ref 11.0–14.6)
WBC: 2.9 10*3/uL — ABNORMAL LOW (ref 4.0–10.3)
lymph#: 1 10*3/uL (ref 0.9–3.3)
nRBC: 0 % (ref 0–0)

## 2011-01-14 LAB — CBC WITH DIFFERENTIAL/PLATELET
BASO%: 0.5 % (ref 0.0–2.0)
Basophils Absolute: 0 10*3/uL (ref 0.0–0.1)
EOS%: 0.7 % (ref 0.0–7.0)
Eosinophils Absolute: 0 10*3/uL (ref 0.0–0.5)
HCT: 44.7 % (ref 38.4–49.9)
HGB: 15.2 g/dL (ref 13.0–17.1)
LYMPH%: 29.1 % (ref 14.0–49.0)
MCH: 29.6 pg (ref 27.2–33.4)
MCHC: 34 g/dL (ref 32.0–36.0)
MCV: 87.1 fL (ref 79.3–98.0)
MONO#: 0.8 10*3/uL (ref 0.1–0.9)
MONO%: 20.4 % — ABNORMAL HIGH (ref 0.0–14.0)
NEUT#: 2 10*3/uL (ref 1.5–6.5)
NEUT%: 49.3 % (ref 39.0–75.0)
Platelets: 64 10*3/uL — ABNORMAL LOW (ref 140–400)
RBC: 5.13 10*6/uL (ref 4.20–5.82)
RDW: 12.4 % (ref 11.0–14.6)
WBC: 4.1 10*3/uL (ref 4.0–10.3)
lymph#: 1.2 10*3/uL (ref 0.9–3.3)
nRBC: 0 % (ref 0–0)

## 2011-01-21 ENCOUNTER — Encounter (HOSPITAL_BASED_OUTPATIENT_CLINIC_OR_DEPARTMENT_OTHER): Payer: Medicare Other | Admitting: Oncology

## 2011-01-21 DIAGNOSIS — D693 Immune thrombocytopenic purpura: Secondary | ICD-10-CM

## 2011-01-21 DIAGNOSIS — D72819 Decreased white blood cell count, unspecified: Secondary | ICD-10-CM

## 2011-01-21 LAB — CBC WITH DIFFERENTIAL/PLATELET
BASO%: 0.4 % (ref 0.0–2.0)
Eosinophils Absolute: 0 10*3/uL (ref 0.0–0.5)
MONO#: 0.5 10*3/uL (ref 0.1–0.9)
MONO%: 20.5 % — ABNORMAL HIGH (ref 0.0–14.0)
NEUT#: 1.1 10*3/uL — ABNORMAL LOW (ref 1.5–6.5)
RBC: 4.99 10*6/uL (ref 4.20–5.82)
RDW: 12.4 % (ref 11.0–14.6)
WBC: 2.6 10*3/uL — ABNORMAL LOW (ref 4.0–10.3)
nRBC: 0 % (ref 0–0)

## 2011-01-28 ENCOUNTER — Other Ambulatory Visit: Payer: Self-pay | Admitting: Oncology

## 2011-01-28 ENCOUNTER — Encounter (HOSPITAL_BASED_OUTPATIENT_CLINIC_OR_DEPARTMENT_OTHER): Payer: Medicare Other | Admitting: Oncology

## 2011-01-28 DIAGNOSIS — D693 Immune thrombocytopenic purpura: Secondary | ICD-10-CM

## 2011-01-28 DIAGNOSIS — D696 Thrombocytopenia, unspecified: Secondary | ICD-10-CM

## 2011-01-28 LAB — CBC WITH DIFFERENTIAL/PLATELET
BASO%: 0.3 % (ref 0.0–2.0)
HCT: 45 % (ref 38.4–49.9)
HGB: 15.1 g/dL (ref 13.0–17.1)
MCHC: 33.6 g/dL (ref 32.0–36.0)
MONO#: 0.6 10*3/uL (ref 0.1–0.9)
NEUT%: 45.4 % (ref 39.0–75.0)
RDW: 12.5 % (ref 11.0–14.6)
WBC: 3.2 10*3/uL — ABNORMAL LOW (ref 4.0–10.3)
lymph#: 1.1 10*3/uL (ref 0.9–3.3)

## 2011-02-04 ENCOUNTER — Encounter (HOSPITAL_BASED_OUTPATIENT_CLINIC_OR_DEPARTMENT_OTHER): Payer: Medicare Other | Admitting: Oncology

## 2011-02-04 ENCOUNTER — Other Ambulatory Visit: Payer: Self-pay | Admitting: Oncology

## 2011-02-04 DIAGNOSIS — D693 Immune thrombocytopenic purpura: Secondary | ICD-10-CM

## 2011-02-04 DIAGNOSIS — Z23 Encounter for immunization: Secondary | ICD-10-CM

## 2011-02-04 LAB — CBC WITH DIFFERENTIAL/PLATELET
Basophils Absolute: 0 10*3/uL (ref 0.0–0.1)
Eosinophils Absolute: 0 10*3/uL (ref 0.0–0.5)
HGB: 14.6 g/dL (ref 13.0–17.1)
MCV: 87.1 fL (ref 79.3–98.0)
MONO#: 0.6 10*3/uL (ref 0.1–0.9)
MONO%: 21.9 % — ABNORMAL HIGH (ref 0.0–14.0)
NEUT#: 1.1 10*3/uL — ABNORMAL LOW (ref 1.5–6.5)
RDW: 12.5 % (ref 11.0–14.6)
WBC: 2.8 10*3/uL — ABNORMAL LOW (ref 4.0–10.3)
lymph#: 1.1 10*3/uL (ref 0.9–3.3)
nRBC: 0 % (ref 0–0)

## 2011-02-11 ENCOUNTER — Other Ambulatory Visit: Payer: Self-pay | Admitting: Oncology

## 2011-02-11 ENCOUNTER — Encounter (HOSPITAL_BASED_OUTPATIENT_CLINIC_OR_DEPARTMENT_OTHER): Payer: Medicare Other | Admitting: Oncology

## 2011-02-11 DIAGNOSIS — D696 Thrombocytopenia, unspecified: Secondary | ICD-10-CM

## 2011-02-11 DIAGNOSIS — D693 Immune thrombocytopenic purpura: Secondary | ICD-10-CM

## 2011-02-11 LAB — CBC WITH DIFFERENTIAL/PLATELET
Basophils Absolute: 0 10*3/uL (ref 0.0–0.1)
EOS%: 0.9 % (ref 0.0–7.0)
HGB: 14.7 g/dL (ref 13.0–17.1)
LYMPH%: 37.1 % (ref 14.0–49.0)
MCH: 29.3 pg (ref 27.2–33.4)
MCV: 86.2 fL (ref 79.3–98.0)
MONO%: 22.3 % — ABNORMAL HIGH (ref 0.0–14.0)
NEUT%: 39.3 % (ref 39.0–75.0)
Platelets: 57 10*3/uL — ABNORMAL LOW (ref 140–400)
RDW: 12.4 % (ref 11.0–14.6)

## 2011-02-18 ENCOUNTER — Other Ambulatory Visit: Payer: Self-pay | Admitting: Oncology

## 2011-02-18 ENCOUNTER — Encounter (HOSPITAL_BASED_OUTPATIENT_CLINIC_OR_DEPARTMENT_OTHER): Payer: Medicare Other | Admitting: Oncology

## 2011-02-18 DIAGNOSIS — D693 Immune thrombocytopenic purpura: Secondary | ICD-10-CM

## 2011-02-18 DIAGNOSIS — Z23 Encounter for immunization: Secondary | ICD-10-CM

## 2011-02-18 LAB — CBC WITH DIFFERENTIAL/PLATELET
BASO%: 0.4 % (ref 0.0–2.0)
EOS%: 0.8 % (ref 0.0–7.0)
HCT: 43.9 % (ref 38.4–49.9)
MCH: 29.3 pg (ref 27.2–33.4)
MCHC: 33.7 g/dL (ref 32.0–36.0)
MONO#: 0.6 10*3/uL (ref 0.1–0.9)
RBC: 5.05 10*6/uL (ref 4.20–5.82)
RDW: 12.5 % (ref 11.0–14.6)
WBC: 2.7 10*3/uL — ABNORMAL LOW (ref 4.0–10.3)
lymph#: 0.9 10*3/uL (ref 0.9–3.3)
nRBC: 0 % (ref 0–0)

## 2011-02-25 ENCOUNTER — Encounter (HOSPITAL_BASED_OUTPATIENT_CLINIC_OR_DEPARTMENT_OTHER): Payer: Medicare Other | Admitting: Oncology

## 2011-02-25 ENCOUNTER — Other Ambulatory Visit: Payer: Self-pay | Admitting: Oncology

## 2011-02-25 DIAGNOSIS — D693 Immune thrombocytopenic purpura: Secondary | ICD-10-CM

## 2011-02-25 DIAGNOSIS — Z23 Encounter for immunization: Secondary | ICD-10-CM

## 2011-02-25 LAB — CBC WITH DIFFERENTIAL/PLATELET
Basophils Absolute: 0 10*3/uL (ref 0.0–0.1)
Eosinophils Absolute: 0 10*3/uL (ref 0.0–0.5)
HGB: 14.9 g/dL (ref 13.0–17.1)
MCV: 86.6 fL (ref 79.3–98.0)
MONO%: 24 % — ABNORMAL HIGH (ref 0.0–14.0)
NEUT#: 1 10*3/uL — ABNORMAL LOW (ref 1.5–6.5)
RBC: 5.09 10*6/uL (ref 4.20–5.82)
RDW: 12.4 % (ref 11.0–14.6)
WBC: 2.6 10*3/uL — ABNORMAL LOW (ref 4.0–10.3)
lymph#: 1 10*3/uL (ref 0.9–3.3)
nRBC: 0 % (ref 0–0)

## 2011-03-04 ENCOUNTER — Encounter (HOSPITAL_BASED_OUTPATIENT_CLINIC_OR_DEPARTMENT_OTHER): Payer: Medicare Other | Admitting: Oncology

## 2011-03-04 ENCOUNTER — Other Ambulatory Visit: Payer: Self-pay | Admitting: Oncology

## 2011-03-04 DIAGNOSIS — Z23 Encounter for immunization: Secondary | ICD-10-CM

## 2011-03-04 DIAGNOSIS — D693 Immune thrombocytopenic purpura: Secondary | ICD-10-CM

## 2011-03-04 LAB — CBC WITH DIFFERENTIAL/PLATELET
BASO%: 0.4 % (ref 0.0–2.0)
Eosinophils Absolute: 0 10*3/uL (ref 0.0–0.5)
HCT: 44 % (ref 38.4–49.9)
MCHC: 33.6 g/dL (ref 32.0–36.0)
MONO#: 0.5 10*3/uL (ref 0.1–0.9)
NEUT#: 1 10*3/uL — ABNORMAL LOW (ref 1.5–6.5)
NEUT%: 41.6 % (ref 39.0–75.0)
Platelets: 64 10*3/uL — ABNORMAL LOW (ref 140–400)
RBC: 5.09 10*6/uL (ref 4.20–5.82)
WBC: 2.4 10*3/uL — ABNORMAL LOW (ref 4.0–10.3)
lymph#: 0.9 10*3/uL (ref 0.9–3.3)

## 2011-03-11 ENCOUNTER — Encounter (HOSPITAL_BASED_OUTPATIENT_CLINIC_OR_DEPARTMENT_OTHER): Payer: Medicare Other | Admitting: Oncology

## 2011-03-11 ENCOUNTER — Other Ambulatory Visit: Payer: Self-pay | Admitting: Oncology

## 2011-03-11 DIAGNOSIS — Z23 Encounter for immunization: Secondary | ICD-10-CM

## 2011-03-11 DIAGNOSIS — D693 Immune thrombocytopenic purpura: Secondary | ICD-10-CM

## 2011-03-11 LAB — CBC WITH DIFFERENTIAL/PLATELET
Basophils Absolute: 0 10*3/uL (ref 0.0–0.1)
Eosinophils Absolute: 0 10*3/uL (ref 0.0–0.5)
HCT: 42.3 % (ref 38.4–49.9)
HGB: 14.3 g/dL (ref 13.0–17.1)
LYMPH%: 34.6 % (ref 14.0–49.0)
MONO#: 0.6 10*3/uL (ref 0.1–0.9)
NEUT%: 41.2 % (ref 39.0–75.0)
Platelets: 91 10*3/uL — ABNORMAL LOW (ref 140–400)
WBC: 2.7 10*3/uL — ABNORMAL LOW (ref 4.0–10.3)
lymph#: 0.9 10*3/uL (ref 0.9–3.3)

## 2011-03-18 ENCOUNTER — Other Ambulatory Visit: Payer: Self-pay | Admitting: Oncology

## 2011-03-18 ENCOUNTER — Encounter (HOSPITAL_BASED_OUTPATIENT_CLINIC_OR_DEPARTMENT_OTHER): Payer: Medicare Other | Admitting: Oncology

## 2011-03-18 DIAGNOSIS — Z23 Encounter for immunization: Secondary | ICD-10-CM

## 2011-03-18 DIAGNOSIS — D693 Immune thrombocytopenic purpura: Secondary | ICD-10-CM

## 2011-03-18 LAB — CBC WITH DIFFERENTIAL/PLATELET
Basophils Absolute: 0 10*3/uL (ref 0.0–0.1)
Eosinophils Absolute: 0 10*3/uL (ref 0.0–0.5)
HCT: 44.8 % (ref 38.4–49.9)
HGB: 15.1 g/dL (ref 13.0–17.1)
LYMPH%: 34.1 % (ref 14.0–49.0)
MCV: 86.3 fL (ref 79.3–98.0)
MONO%: 29.2 % — ABNORMAL HIGH (ref 0.0–14.0)
NEUT#: 1.2 10*3/uL — ABNORMAL LOW (ref 1.5–6.5)
NEUT%: 35.8 % — ABNORMAL LOW (ref 39.0–75.0)
Platelets: 95 10*3/uL — ABNORMAL LOW (ref 140–400)
RDW: 12.2 % (ref 11.0–14.6)

## 2011-03-25 ENCOUNTER — Other Ambulatory Visit: Payer: Self-pay | Admitting: Oncology

## 2011-03-25 ENCOUNTER — Encounter (HOSPITAL_BASED_OUTPATIENT_CLINIC_OR_DEPARTMENT_OTHER): Payer: Medicare Other | Admitting: Oncology

## 2011-03-25 DIAGNOSIS — D693 Immune thrombocytopenic purpura: Secondary | ICD-10-CM

## 2011-03-25 DIAGNOSIS — D709 Neutropenia, unspecified: Secondary | ICD-10-CM

## 2011-03-25 LAB — CBC WITH DIFFERENTIAL/PLATELET
BASO%: 0.4 % (ref 0.0–2.0)
HCT: 44.1 % (ref 38.4–49.9)
LYMPH%: 33.2 % (ref 14.0–49.0)
MCHC: 33.8 g/dL (ref 32.0–36.0)
MCV: 86.8 fL (ref 79.3–98.0)
MONO#: 0.6 10*3/uL (ref 0.1–0.9)
MONO%: 23 % — ABNORMAL HIGH (ref 0.0–14.0)
NEUT%: 42.2 % (ref 39.0–75.0)
Platelets: 65 10*3/uL — ABNORMAL LOW (ref 140–400)
WBC: 2.4 10*3/uL — ABNORMAL LOW (ref 4.0–10.3)

## 2011-03-29 LAB — TISSUE HYBRIDIZATION (BONE MARROW)-NCBH

## 2011-04-01 ENCOUNTER — Encounter (HOSPITAL_BASED_OUTPATIENT_CLINIC_OR_DEPARTMENT_OTHER): Payer: Medicare Other | Admitting: Oncology

## 2011-04-01 ENCOUNTER — Other Ambulatory Visit: Payer: Self-pay | Admitting: Oncology

## 2011-04-01 DIAGNOSIS — D693 Immune thrombocytopenic purpura: Secondary | ICD-10-CM

## 2011-04-01 DIAGNOSIS — Z23 Encounter for immunization: Secondary | ICD-10-CM

## 2011-04-01 LAB — CBC WITH DIFFERENTIAL/PLATELET
Basophils Absolute: 0 10*3/uL (ref 0.0–0.1)
EOS%: 1 % (ref 0.0–7.0)
Eosinophils Absolute: 0 10*3/uL (ref 0.0–0.5)
HGB: 15.2 g/dL (ref 13.0–17.1)
LYMPH%: 35.1 % (ref 14.0–49.0)
MCH: 29.7 pg (ref 27.2–33.4)
MCV: 86.5 fL (ref 79.3–98.0)
MONO%: 23.2 % — ABNORMAL HIGH (ref 0.0–14.0)
NEUT%: 40 % (ref 39.0–75.0)
Platelets: 111 10*3/uL — ABNORMAL LOW (ref 140–400)
RDW: 12.3 % (ref 11.0–14.6)

## 2011-04-07 ENCOUNTER — Encounter (HOSPITAL_BASED_OUTPATIENT_CLINIC_OR_DEPARTMENT_OTHER): Payer: Medicare Other | Admitting: Oncology

## 2011-04-07 ENCOUNTER — Other Ambulatory Visit: Payer: Self-pay | Admitting: Oncology

## 2011-04-07 DIAGNOSIS — D693 Immune thrombocytopenic purpura: Secondary | ICD-10-CM

## 2011-04-07 DIAGNOSIS — D72821 Monocytosis (symptomatic): Secondary | ICD-10-CM

## 2011-04-07 LAB — CBC WITH DIFFERENTIAL/PLATELET
BASO%: 0.8 % (ref 0.0–2.0)
EOS%: 0.8 % (ref 0.0–7.0)
HCT: 42.8 % (ref 38.4–49.9)
LYMPH%: 37 % (ref 14.0–49.0)
MCH: 29.1 pg (ref 27.2–33.4)
MCHC: 33.6 g/dL (ref 32.0–36.0)
MCV: 86.6 fL (ref 79.3–98.0)
MONO%: 19.3 % — ABNORMAL HIGH (ref 0.0–14.0)
NEUT%: 42.1 % (ref 39.0–75.0)
Platelets: 66 10*3/uL — ABNORMAL LOW (ref 140–400)
RBC: 4.94 10*6/uL (ref 4.20–5.82)
WBC: 2.4 10*3/uL — ABNORMAL LOW (ref 4.0–10.3)
nRBC: 0 % (ref 0–0)

## 2011-04-15 ENCOUNTER — Encounter (HOSPITAL_BASED_OUTPATIENT_CLINIC_OR_DEPARTMENT_OTHER): Payer: Medicare Other | Admitting: Oncology

## 2011-04-15 ENCOUNTER — Other Ambulatory Visit: Payer: Self-pay | Admitting: Oncology

## 2011-04-15 DIAGNOSIS — D693 Immune thrombocytopenic purpura: Secondary | ICD-10-CM

## 2011-04-15 DIAGNOSIS — Z23 Encounter for immunization: Secondary | ICD-10-CM

## 2011-04-15 DIAGNOSIS — D696 Thrombocytopenia, unspecified: Secondary | ICD-10-CM

## 2011-04-15 LAB — CBC WITH DIFFERENTIAL/PLATELET
BASO%: 0.4 % (ref 0.0–2.0)
Basophils Absolute: 0 10*3/uL (ref 0.0–0.1)
EOS%: 0.4 % (ref 0.0–7.0)
HGB: 15.3 g/dL (ref 13.0–17.1)
MCH: 29.3 pg (ref 27.2–33.4)
MCHC: 34 g/dL (ref 32.0–36.0)
MCV: 86 fL (ref 79.3–98.0)
MONO%: 27 % — ABNORMAL HIGH (ref 0.0–14.0)
RDW: 12.1 % (ref 11.0–14.6)
lymph#: 0.9 10*3/uL (ref 0.9–3.3)

## 2011-04-15 LAB — LACTATE DEHYDROGENASE: LDH: 156 U/L (ref 94–250)

## 2011-04-15 LAB — COMPREHENSIVE METABOLIC PANEL
ALT: 25 U/L (ref 0–53)
AST: 20 U/L (ref 0–37)
Albumin: 4.2 g/dL (ref 3.5–5.2)
CO2: 29 mEq/L (ref 19–32)
Calcium: 9.4 mg/dL (ref 8.4–10.5)
Chloride: 103 mEq/L (ref 96–112)
Potassium: 3.9 mEq/L (ref 3.5–5.3)

## 2011-04-15 LAB — MORPHOLOGY: PLT EST: DECREASED

## 2011-04-20 ENCOUNTER — Encounter (HOSPITAL_BASED_OUTPATIENT_CLINIC_OR_DEPARTMENT_OTHER): Payer: Medicare Other | Admitting: Oncology

## 2011-04-20 DIAGNOSIS — D693 Immune thrombocytopenic purpura: Secondary | ICD-10-CM

## 2011-04-22 ENCOUNTER — Encounter (HOSPITAL_BASED_OUTPATIENT_CLINIC_OR_DEPARTMENT_OTHER): Payer: Medicare Other | Admitting: Oncology

## 2011-04-22 ENCOUNTER — Other Ambulatory Visit: Payer: Self-pay | Admitting: Oncology

## 2011-04-22 DIAGNOSIS — D693 Immune thrombocytopenic purpura: Secondary | ICD-10-CM

## 2011-04-22 LAB — CBC WITH DIFFERENTIAL/PLATELET
BASO%: 0.6 % (ref 0.0–2.0)
EOS%: 0.8 % (ref 0.0–7.0)
MCH: 29.2 pg (ref 27.2–33.4)
MCHC: 34 g/dL (ref 32.0–36.0)
MCV: 86 fL (ref 79.3–98.0)
MONO%: 24.5 % — ABNORMAL HIGH (ref 0.0–14.0)
RBC: 4.93 10*6/uL (ref 4.20–5.82)
RDW: 12 % (ref 11.0–14.6)
lymph#: 0.9 10*3/uL (ref 0.9–3.3)
nRBC: 0 % (ref 0–0)

## 2011-04-22 LAB — TECHNOLOGIST REVIEW

## 2011-04-29 ENCOUNTER — Encounter (HOSPITAL_BASED_OUTPATIENT_CLINIC_OR_DEPARTMENT_OTHER): Payer: Medicare Other | Admitting: Oncology

## 2011-04-29 ENCOUNTER — Other Ambulatory Visit: Payer: Self-pay | Admitting: Oncology

## 2011-04-29 DIAGNOSIS — Z5112 Encounter for antineoplastic immunotherapy: Secondary | ICD-10-CM

## 2011-04-29 DIAGNOSIS — D693 Immune thrombocytopenic purpura: Secondary | ICD-10-CM

## 2011-04-29 LAB — CBC WITH DIFFERENTIAL/PLATELET
BASO%: 0.3 % (ref 0.0–2.0)
EOS%: 0.8 % (ref 0.0–7.0)
HCT: 41 % (ref 38.4–49.9)
MCH: 29.1 pg (ref 27.2–33.4)
MCHC: 34.1 g/dL (ref 32.0–36.0)
NEUT%: 49.9 % (ref 39.0–75.0)
RBC: 4.81 10*6/uL (ref 4.20–5.82)
RDW: 12.2 % (ref 11.0–14.6)
WBC: 4 10*3/uL (ref 4.0–10.3)
lymph#: 1.1 10*3/uL (ref 0.9–3.3)
nRBC: 0 % (ref 0–0)

## 2011-05-06 ENCOUNTER — Other Ambulatory Visit: Payer: Self-pay | Admitting: Oncology

## 2011-05-06 ENCOUNTER — Encounter (HOSPITAL_BASED_OUTPATIENT_CLINIC_OR_DEPARTMENT_OTHER): Payer: Medicare Other | Admitting: Oncology

## 2011-05-06 DIAGNOSIS — D693 Immune thrombocytopenic purpura: Secondary | ICD-10-CM

## 2011-05-06 LAB — CBC WITH DIFFERENTIAL/PLATELET
BASO%: 0.2 % (ref 0.0–2.0)
LYMPH%: 30.6 % (ref 14.0–49.0)
MCHC: 33.3 g/dL (ref 32.0–36.0)
MONO#: 1 10*3/uL — ABNORMAL HIGH (ref 0.1–0.9)
MONO%: 23.3 % — ABNORMAL HIGH (ref 0.0–14.0)
Platelets: 97 10*3/uL — ABNORMAL LOW (ref 140–400)
RBC: 4.87 10*6/uL (ref 4.20–5.82)
RDW: 12.8 % (ref 11.0–14.6)
WBC: 4.3 10*3/uL (ref 4.0–10.3)
nRBC: 0 % (ref 0–0)

## 2011-05-07 NOTE — Op Note (Signed)
NAME:  Yuille, Sydney                             ACCOUNT NO.:  [id number]   MEDICAL RECORD NO.:  [id number]                   PATIENT TYPE:  AMB   LOCATION:  DSC                                  FACILITY:  [facility]   PHYSICIAN:  Jimmye Norman III, M.D.               DATE OF BIRTH:  May 04, 1941   DATE OF PROCEDURE:  01/24/2004  DATE OF DISCHARGE:                                 OPERATIVE REPORT   PREOPERATIVE DIAGNOSIS:  Infected sebaceous cyst of left anterior chest  wall.   POSTOPERATIVE DIAGNOSIS:  Infected subcutaneous mass of left chest wall and  anterior axillary line with sinus tract extending down to the subpectoral  chest wall.   PROCEDURE:  Deep excision of left anterior chest wall mass.   SURGEON:  Jimmye Norman, M.D.   ASSISTANT:  None.   ANESTHESIA:  1% Xylocaine local with epinephrine, along with monitored  anesthesia care, 23 mL were used.   COMPLICATIONS:  None.   CONDITION:  Stable.   FINDINGS:  The patient had an infected subcu mass in the chest wall, which  extended all the way down into the subpectoral region, down to an area which  appeared to be just on top of the ribs on the left anterior chest wall side.  We did not enter the chest wall cavity; however, we were able to remove some  what appeared to be infected material but not pus from the anterior chest  wall in this pocket that was just underneath the pectoralis on the left  side.  This area was closed and clipped for identification on chest x-ray.   OPERATION:  The patient was taken to the operating room and placed on the  table in the supine position.  After an adequate amount of IV sedation was  given, he was prepped and draped in the usual sterile manner and exposing  the mass in the left anterior axillary line of the chest wall.   We anesthetized the area using a 25-gauge needle and 1% Xylocaine with  epinephrine.  It was taken down into the subcutaneous tissue in an oval  manner around the mass,  which was infected.  We made the length of it  approximately 8 cm long and then subsequently made an oval incision around  this mass, using a #15 blade and taking down through the subcutaneous  tissue.  We then used electrocautery to help dissect out this area of  induration and mass; however, as we dug deeper into the subcu, we ran into a  pocket and into a sinus tract, which extended as described previously  anteromedially into the subpectoral region and towards the chest wall.  We  carefully dissected out this mass, making sure that we did not enter into  the cavity itself, but we used a lacrimal probe in order to open into the  sinus tract and pass it down towards  the chest wall, where we followed it  during our dissection with Metzenbaum scissors. A lot of the dissection was  sharp with Metzenbaums and with just pushing the inflamed tissue away from  the sinus tract.  We subsequently ended the sinus tract at the subpectoral  region where we subsequently irrigated out that area with about 20 mL to 40  mL of saline and also with blunt __________ debridement using a Ray-Tec  gauze and a Kelly clamp.  We then oversewed the hole in the subpectoral  region in the fascia using a  figure-of-eight stitch of 3-0 Vicryl.  We then closed the incision, which  then measured about 8-9 cm long using a running subcuticular stitch of 4-0  Vicryl in the subcu, and the skin was closed using a subcuticular stitch of  3-0 Vicryl.  Sterile dressings were applied.  The patient was taken to the  recovery room in a stable condition.                                               Kathrin Ruddy, M.D.    JW/MEDQ  D:  01/24/2004  T:  01/25/2004  Job:  [id number]

## 2011-05-13 ENCOUNTER — Encounter (HOSPITAL_BASED_OUTPATIENT_CLINIC_OR_DEPARTMENT_OTHER): Payer: Medicare Other | Admitting: Oncology

## 2011-05-13 ENCOUNTER — Other Ambulatory Visit: Payer: Self-pay | Admitting: Oncology

## 2011-05-13 DIAGNOSIS — D696 Thrombocytopenia, unspecified: Secondary | ICD-10-CM

## 2011-05-13 DIAGNOSIS — D693 Immune thrombocytopenic purpura: Secondary | ICD-10-CM

## 2011-05-13 LAB — CBC WITH DIFFERENTIAL/PLATELET
BASO%: 0.4 % (ref 0.0–2.0)
EOS%: 1.1 % (ref 0.0–7.0)
MCH: 28.9 pg (ref 27.2–33.4)
MCHC: 33.3 g/dL (ref 32.0–36.0)
RDW: 12.8 % (ref 11.0–14.6)
lymph#: 1 10*3/uL (ref 0.9–3.3)
nRBC: 0 % (ref 0–0)

## 2011-05-20 ENCOUNTER — Encounter (HOSPITAL_BASED_OUTPATIENT_CLINIC_OR_DEPARTMENT_OTHER): Payer: Medicare Other | Admitting: Oncology

## 2011-05-20 ENCOUNTER — Other Ambulatory Visit: Payer: Self-pay | Admitting: Oncology

## 2011-05-20 DIAGNOSIS — D693 Immune thrombocytopenic purpura: Secondary | ICD-10-CM

## 2011-05-20 DIAGNOSIS — Y849 Medical procedure, unspecified as the cause of abnormal reaction of the patient, or of later complication, without mention of misadventure at the time of the procedure: Secondary | ICD-10-CM

## 2011-05-20 LAB — CBC WITH DIFFERENTIAL/PLATELET
Eosinophils Absolute: 0 10*3/uL (ref 0.0–0.5)
MONO#: 0.5 10*3/uL (ref 0.1–0.9)
MONO%: 23.1 % — ABNORMAL HIGH (ref 0.0–14.0)
NEUT#: 0.9 10*3/uL — ABNORMAL LOW (ref 1.5–6.5)
RBC: 4.86 10*6/uL (ref 4.20–5.82)
RDW: 12.9 % (ref 11.0–14.6)
WBC: 2.1 10*3/uL — ABNORMAL LOW (ref 4.0–10.3)
nRBC: 0 % (ref 0–0)

## 2011-05-27 ENCOUNTER — Encounter (HOSPITAL_BASED_OUTPATIENT_CLINIC_OR_DEPARTMENT_OTHER): Payer: Medicare Other | Admitting: Oncology

## 2011-05-27 ENCOUNTER — Other Ambulatory Visit: Payer: Self-pay | Admitting: Oncology

## 2011-05-27 DIAGNOSIS — D693 Immune thrombocytopenic purpura: Secondary | ICD-10-CM

## 2011-05-27 DIAGNOSIS — D696 Thrombocytopenia, unspecified: Secondary | ICD-10-CM

## 2011-05-27 LAB — CBC WITH DIFFERENTIAL/PLATELET
BASO%: 0 % (ref 0.0–2.0)
HCT: 42.7 % (ref 38.4–49.9)
HGB: 14.5 g/dL (ref 13.0–17.1)
MCHC: 34 g/dL (ref 32.0–36.0)
MONO#: 0.6 10*3/uL (ref 0.1–0.9)
NEUT%: 38.9 % — ABNORMAL LOW (ref 39.0–75.0)
WBC: 2.4 10*3/uL — ABNORMAL LOW (ref 4.0–10.3)
lymph#: 0.9 10*3/uL (ref 0.9–3.3)

## 2011-06-03 ENCOUNTER — Encounter (HOSPITAL_BASED_OUTPATIENT_CLINIC_OR_DEPARTMENT_OTHER): Payer: Medicare Other | Admitting: Oncology

## 2011-06-03 ENCOUNTER — Other Ambulatory Visit: Payer: Self-pay | Admitting: Oncology

## 2011-06-03 DIAGNOSIS — D693 Immune thrombocytopenic purpura: Secondary | ICD-10-CM

## 2011-06-03 DIAGNOSIS — D6959 Other secondary thrombocytopenia: Secondary | ICD-10-CM

## 2011-06-03 LAB — CBC WITH DIFFERENTIAL/PLATELET
Basophils Absolute: 0 10*3/uL (ref 0.0–0.1)
Eosinophils Absolute: 0 10*3/uL (ref 0.0–0.5)
HGB: 14.4 g/dL (ref 13.0–17.1)
MCV: 86.8 fL (ref 79.3–98.0)
MONO#: 0.7 10*3/uL (ref 0.1–0.9)
MONO%: 25.2 % — ABNORMAL HIGH (ref 0.0–14.0)
NEUT#: 1.3 10*3/uL — ABNORMAL LOW (ref 1.5–6.5)
RDW: 13 % (ref 11.0–14.6)
WBC: 2.7 10*3/uL — ABNORMAL LOW (ref 4.0–10.3)
lymph#: 0.7 10*3/uL — ABNORMAL LOW (ref 0.9–3.3)

## 2011-06-10 ENCOUNTER — Other Ambulatory Visit: Payer: Self-pay | Admitting: Oncology

## 2011-06-10 ENCOUNTER — Encounter (HOSPITAL_BASED_OUTPATIENT_CLINIC_OR_DEPARTMENT_OTHER): Payer: Medicare Other | Admitting: Oncology

## 2011-06-10 DIAGNOSIS — Y849 Medical procedure, unspecified as the cause of abnormal reaction of the patient, or of later complication, without mention of misadventure at the time of the procedure: Secondary | ICD-10-CM

## 2011-06-10 DIAGNOSIS — D693 Immune thrombocytopenic purpura: Secondary | ICD-10-CM

## 2011-06-10 LAB — CBC WITH DIFFERENTIAL/PLATELET
Eosinophils Absolute: 0 10*3/uL (ref 0.0–0.5)
HCT: 43.2 % (ref 38.4–49.9)
HGB: 14.5 g/dL (ref 13.0–17.1)
LYMPH%: 33.2 % (ref 14.0–49.0)
MONO#: 0.7 10*3/uL (ref 0.1–0.9)
NEUT#: 1.1 10*3/uL — ABNORMAL LOW (ref 1.5–6.5)
NEUT%: 39.9 % (ref 39.0–75.0)
Platelets: 64 10*3/uL — ABNORMAL LOW (ref 140–400)
WBC: 2.8 10*3/uL — ABNORMAL LOW (ref 4.0–10.3)
lymph#: 0.9 10*3/uL (ref 0.9–3.3)

## 2011-06-17 ENCOUNTER — Other Ambulatory Visit: Payer: Self-pay | Admitting: Oncology

## 2011-06-17 ENCOUNTER — Encounter (HOSPITAL_BASED_OUTPATIENT_CLINIC_OR_DEPARTMENT_OTHER): Payer: Medicare Other | Admitting: Oncology

## 2011-06-17 DIAGNOSIS — D6959 Other secondary thrombocytopenia: Secondary | ICD-10-CM

## 2011-06-17 LAB — CBC WITH DIFFERENTIAL/PLATELET
Basophils Absolute: 0 10*3/uL (ref 0.0–0.1)
EOS%: 0.5 % (ref 0.0–7.0)
Eosinophils Absolute: 0 10*3/uL (ref 0.0–0.5)
HCT: 43.7 % (ref 38.4–49.9)
HGB: 14.6 g/dL (ref 13.0–17.1)
MCH: 29.2 pg (ref 27.2–33.4)
MCV: 87.4 fL (ref 79.3–98.0)
MONO%: 23.8 % — ABNORMAL HIGH (ref 0.0–14.0)
NEUT#: 1.4 10*3/uL — ABNORMAL LOW (ref 1.5–6.5)
NEUT%: 38.8 % — ABNORMAL LOW (ref 39.0–75.0)
lymph#: 1.3 10*3/uL (ref 0.9–3.3)

## 2011-06-24 ENCOUNTER — Encounter (HOSPITAL_BASED_OUTPATIENT_CLINIC_OR_DEPARTMENT_OTHER): Payer: Medicare Other | Admitting: Oncology

## 2011-06-24 ENCOUNTER — Other Ambulatory Visit: Payer: Self-pay | Admitting: Oncology

## 2011-06-24 DIAGNOSIS — D6959 Other secondary thrombocytopenia: Secondary | ICD-10-CM

## 2011-06-24 DIAGNOSIS — D693 Immune thrombocytopenic purpura: Secondary | ICD-10-CM

## 2011-06-24 LAB — CBC WITH DIFFERENTIAL/PLATELET
Eosinophils Absolute: 0 10*3/uL (ref 0.0–0.5)
HCT: 42.9 % (ref 38.4–49.9)
LYMPH%: 37.4 % (ref 14.0–49.0)
MCV: 87.2 fL (ref 79.3–98.0)
MONO#: 0.4 10*3/uL (ref 0.1–0.9)
MONO%: 18 % — ABNORMAL HIGH (ref 0.0–14.0)
NEUT#: 1 10*3/uL — ABNORMAL LOW (ref 1.5–6.5)
NEUT%: 42.8 % (ref 39.0–75.0)
Platelets: 117 10*3/uL — ABNORMAL LOW (ref 140–400)
RBC: 4.92 10*6/uL (ref 4.20–5.82)
WBC: 2.2 10*3/uL — ABNORMAL LOW (ref 4.0–10.3)
nRBC: 0 % (ref 0–0)

## 2011-07-01 ENCOUNTER — Encounter (HOSPITAL_BASED_OUTPATIENT_CLINIC_OR_DEPARTMENT_OTHER): Payer: Medicare Other | Admitting: Oncology

## 2011-07-01 ENCOUNTER — Other Ambulatory Visit: Payer: Self-pay | Admitting: Oncology

## 2011-07-01 DIAGNOSIS — D693 Immune thrombocytopenic purpura: Secondary | ICD-10-CM

## 2011-07-01 LAB — CBC WITH DIFFERENTIAL/PLATELET
BASO%: 0.4 % (ref 0.0–2.0)
Basophils Absolute: 0 10*3/uL (ref 0.0–0.1)
EOS%: 0.4 % (ref 0.0–7.0)
HCT: 42.6 % (ref 38.4–49.9)
HGB: 14.4 g/dL (ref 13.0–17.1)
LYMPH%: 36.5 % (ref 14.0–49.0)
MCH: 29.2 pg (ref 27.2–33.4)
MCHC: 33.8 g/dL (ref 32.0–36.0)
MCV: 86.4 fL (ref 79.3–98.0)
MONO%: 27 % — ABNORMAL HIGH (ref 0.0–14.0)
NEUT%: 35.7 % — ABNORMAL LOW (ref 39.0–75.0)
Platelets: 108 10*3/uL — ABNORMAL LOW (ref 140–400)
lymph#: 1 10*3/uL (ref 0.9–3.3)

## 2011-07-08 ENCOUNTER — Encounter (HOSPITAL_BASED_OUTPATIENT_CLINIC_OR_DEPARTMENT_OTHER): Payer: Medicare Other | Admitting: Oncology

## 2011-07-08 ENCOUNTER — Other Ambulatory Visit: Payer: Self-pay | Admitting: Oncology

## 2011-07-08 DIAGNOSIS — D693 Immune thrombocytopenic purpura: Secondary | ICD-10-CM

## 2011-07-08 LAB — CBC WITH DIFFERENTIAL/PLATELET
EOS%: 0.8 % (ref 0.0–7.0)
Eosinophils Absolute: 0 10*3/uL (ref 0.0–0.5)
LYMPH%: 38.8 % (ref 14.0–49.0)
MCH: 29 pg (ref 27.2–33.4)
MCHC: 33.5 g/dL (ref 32.0–36.0)
MCV: 86.7 fL (ref 79.3–98.0)
MONO%: 22.4 % — ABNORMAL HIGH (ref 0.0–14.0)
NEUT#: 1 10*3/uL — ABNORMAL LOW (ref 1.5–6.5)
Platelets: 58 10*3/uL — ABNORMAL LOW (ref 140–400)
RBC: 4.96 10*6/uL (ref 4.20–5.82)
RDW: 12.7 % (ref 11.0–14.6)
nRBC: 0 % (ref 0–0)

## 2011-07-15 ENCOUNTER — Encounter (HOSPITAL_BASED_OUTPATIENT_CLINIC_OR_DEPARTMENT_OTHER): Payer: Medicare Other | Admitting: Oncology

## 2011-07-15 ENCOUNTER — Other Ambulatory Visit: Payer: Self-pay | Admitting: Oncology

## 2011-07-15 DIAGNOSIS — D693 Immune thrombocytopenic purpura: Secondary | ICD-10-CM

## 2011-07-15 LAB — CBC WITH DIFFERENTIAL/PLATELET
BASO%: 0.7 % (ref 0.0–2.0)
EOS%: 0.3 % (ref 0.0–7.0)
HCT: 43.3 % (ref 38.4–49.9)
LYMPH%: 34.9 % (ref 14.0–49.0)
MCH: 29.2 pg (ref 27.2–33.4)
MCHC: 33.9 g/dL (ref 32.0–36.0)
NEUT%: 32.3 % — ABNORMAL LOW (ref 39.0–75.0)
Platelets: 69 10*3/uL — ABNORMAL LOW (ref 140–400)
RBC: 5.03 10*6/uL (ref 4.20–5.82)
WBC: 2.9 10*3/uL — ABNORMAL LOW (ref 4.0–10.3)
lymph#: 1 10*3/uL (ref 0.9–3.3)
nRBC: 0 % (ref 0–0)

## 2011-07-22 ENCOUNTER — Other Ambulatory Visit: Payer: Self-pay | Admitting: Oncology

## 2011-07-22 ENCOUNTER — Encounter (HOSPITAL_BASED_OUTPATIENT_CLINIC_OR_DEPARTMENT_OTHER): Payer: Medicare Other | Admitting: Oncology

## 2011-07-22 DIAGNOSIS — D693 Immune thrombocytopenic purpura: Secondary | ICD-10-CM

## 2011-07-22 LAB — CBC WITH DIFFERENTIAL/PLATELET
BASO%: 0.8 % (ref 0.0–2.0)
EOS%: 0.8 % (ref 0.0–7.0)
MCH: 29.5 pg (ref 27.2–33.4)
MCV: 86.1 fL (ref 79.3–98.0)
MONO%: 24.6 % — ABNORMAL HIGH (ref 0.0–14.0)
RBC: 4.88 10*6/uL (ref 4.20–5.82)
RDW: 12.6 % (ref 11.0–14.6)
lymph#: 0.9 10*3/uL (ref 0.9–3.3)
nRBC: 0 % (ref 0–0)

## 2011-07-29 ENCOUNTER — Other Ambulatory Visit: Payer: Self-pay | Admitting: Oncology

## 2011-07-29 ENCOUNTER — Encounter (HOSPITAL_BASED_OUTPATIENT_CLINIC_OR_DEPARTMENT_OTHER): Payer: Medicare Other | Admitting: Oncology

## 2011-07-29 DIAGNOSIS — D693 Immune thrombocytopenic purpura: Secondary | ICD-10-CM

## 2011-07-29 LAB — CBC WITH DIFFERENTIAL/PLATELET
Basophils Absolute: 0 10*3/uL (ref 0.0–0.1)
EOS%: 0.7 % (ref 0.0–7.0)
Eosinophils Absolute: 0 10*3/uL (ref 0.0–0.5)
HCT: 44.1 % (ref 38.4–49.9)
HGB: 14.8 g/dL (ref 13.0–17.1)
MCH: 29.7 pg (ref 27.2–33.4)
MCV: 88.4 fL (ref 79.3–98.0)
MONO%: 28.5 % — ABNORMAL HIGH (ref 0.0–14.0)
NEUT%: 39.3 % (ref 39.0–75.0)
Platelets: 89 10*3/uL — ABNORMAL LOW (ref 140–400)
WBC: 3 10*3/uL — ABNORMAL LOW (ref 4.0–10.3)

## 2011-08-06 ENCOUNTER — Encounter (HOSPITAL_BASED_OUTPATIENT_CLINIC_OR_DEPARTMENT_OTHER): Payer: Medicare Other | Admitting: Oncology

## 2011-08-06 ENCOUNTER — Other Ambulatory Visit: Payer: Self-pay | Admitting: Oncology

## 2011-08-06 DIAGNOSIS — D693 Immune thrombocytopenic purpura: Secondary | ICD-10-CM

## 2011-08-06 LAB — CBC WITH DIFFERENTIAL/PLATELET
Eosinophils Absolute: 0 10*3/uL (ref 0.0–0.5)
HCT: 43.3 % (ref 38.4–49.9)
LYMPH%: 33.5 % (ref 14.0–49.0)
MCHC: 34.2 g/dL (ref 32.0–36.0)
MCV: 85.9 fL (ref 79.3–98.0)
MONO#: 0.8 10*3/uL (ref 0.1–0.9)
MONO%: 30.2 % — ABNORMAL HIGH (ref 0.0–14.0)
NEUT%: 34.9 % — ABNORMAL LOW (ref 39.0–75.0)
Platelets: 100 10*3/uL — ABNORMAL LOW (ref 140–400)
RBC: 5.04 10*6/uL (ref 4.20–5.82)
nRBC: 0 % (ref 0–0)

## 2011-08-12 ENCOUNTER — Encounter (HOSPITAL_BASED_OUTPATIENT_CLINIC_OR_DEPARTMENT_OTHER): Payer: Medicare Other | Admitting: Oncology

## 2011-08-12 ENCOUNTER — Other Ambulatory Visit: Payer: Self-pay | Admitting: Oncology

## 2011-08-12 DIAGNOSIS — D693 Immune thrombocytopenic purpura: Secondary | ICD-10-CM

## 2011-08-12 LAB — CBC WITH DIFFERENTIAL/PLATELET
BASO%: 0.4 % (ref 0.0–2.0)
Basophils Absolute: 0 10*3/uL (ref 0.0–0.1)
EOS%: 0.4 % (ref 0.0–7.0)
HCT: 43.4 % (ref 38.4–49.9)
HGB: 14.4 g/dL (ref 13.0–17.1)
LYMPH%: 30.5 % (ref 14.0–49.0)
MCH: 28.8 pg (ref 27.2–33.4)
MCHC: 33.2 g/dL (ref 32.0–36.0)
MCV: 86.8 fL (ref 79.3–98.0)
MONO%: 26.5 % — ABNORMAL HIGH (ref 0.0–14.0)
NEUT%: 42.2 % (ref 39.0–75.0)
lymph#: 0.7 10*3/uL — ABNORMAL LOW (ref 0.9–3.3)

## 2011-08-19 ENCOUNTER — Other Ambulatory Visit: Payer: Self-pay | Admitting: Oncology

## 2011-08-19 ENCOUNTER — Encounter (HOSPITAL_BASED_OUTPATIENT_CLINIC_OR_DEPARTMENT_OTHER): Payer: Medicare Other | Admitting: Oncology

## 2011-08-19 DIAGNOSIS — D693 Immune thrombocytopenic purpura: Secondary | ICD-10-CM

## 2011-08-19 LAB — CBC WITH DIFFERENTIAL/PLATELET
BASO%: 0.4 % (ref 0.0–2.0)
EOS%: 0.8 % (ref 0.0–7.0)
HCT: 43.6 % (ref 38.4–49.9)
LYMPH%: 35 % (ref 14.0–49.0)
MCH: 29.3 pg (ref 27.2–33.4)
MCHC: 33.7 g/dL (ref 32.0–36.0)
MONO#: 0.7 10*3/uL (ref 0.1–0.9)
NEUT%: 34.1 % — ABNORMAL LOW (ref 39.0–75.0)
Platelets: 71 10*3/uL — ABNORMAL LOW (ref 140–400)
RBC: 5.01 10*6/uL (ref 4.20–5.82)
WBC: 2.5 10*3/uL — ABNORMAL LOW (ref 4.0–10.3)
nRBC: 0 % (ref 0–0)

## 2011-08-26 ENCOUNTER — Encounter (HOSPITAL_BASED_OUTPATIENT_CLINIC_OR_DEPARTMENT_OTHER): Payer: Medicare Other | Admitting: Oncology

## 2011-08-26 ENCOUNTER — Other Ambulatory Visit: Payer: Self-pay | Admitting: Oncology

## 2011-08-26 DIAGNOSIS — Z5189 Encounter for other specified aftercare: Secondary | ICD-10-CM

## 2011-08-26 DIAGNOSIS — D693 Immune thrombocytopenic purpura: Secondary | ICD-10-CM

## 2011-08-26 LAB — CBC WITH DIFFERENTIAL/PLATELET
BASO%: 0.4 % (ref 0.0–2.0)
Basophils Absolute: 0 10*3/uL (ref 0.0–0.1)
EOS%: 0.7 % (ref 0.0–7.0)
HGB: 14.6 g/dL (ref 13.0–17.1)
MCH: 29.3 pg (ref 27.2–33.4)
MCHC: 34 g/dL (ref 32.0–36.0)
MCV: 86.2 fL (ref 79.3–98.0)
MONO%: 22.5 % — ABNORMAL HIGH (ref 0.0–14.0)
RBC: 4.99 10*6/uL (ref 4.20–5.82)
RDW: 12.4 % (ref 11.0–14.6)
lymph#: 1.1 10*3/uL (ref 0.9–3.3)
nRBC: 0 % (ref 0–0)

## 2011-08-26 LAB — TECHNOLOGIST REVIEW

## 2011-09-02 ENCOUNTER — Other Ambulatory Visit: Payer: Self-pay | Admitting: Oncology

## 2011-09-02 ENCOUNTER — Encounter (HOSPITAL_BASED_OUTPATIENT_CLINIC_OR_DEPARTMENT_OTHER): Payer: Medicare Other | Admitting: Oncology

## 2011-09-02 DIAGNOSIS — D693 Immune thrombocytopenic purpura: Secondary | ICD-10-CM

## 2011-09-02 LAB — CBC WITH DIFFERENTIAL/PLATELET
BASO%: 0.3 % (ref 0.0–2.0)
EOS%: 0.7 % (ref 0.0–7.0)
HCT: 43.5 % (ref 38.4–49.9)
MCH: 29.1 pg (ref 27.2–33.4)
MCHC: 33.8 g/dL (ref 32.0–36.0)
MONO#: 0.9 10*3/uL (ref 0.1–0.9)
NEUT%: 28.3 % — ABNORMAL LOW (ref 39.0–75.0)
RBC: 5.06 10*6/uL (ref 4.20–5.82)
RDW: 12.3 % (ref 11.0–14.6)
WBC: 3 10*3/uL — ABNORMAL LOW (ref 4.0–10.3)
lymph#: 1.2 10*3/uL (ref 0.9–3.3)
nRBC: 0 % (ref 0–0)

## 2011-09-09 ENCOUNTER — Encounter (HOSPITAL_BASED_OUTPATIENT_CLINIC_OR_DEPARTMENT_OTHER): Payer: Medicare Other | Admitting: Oncology

## 2011-09-09 ENCOUNTER — Other Ambulatory Visit: Payer: Self-pay | Admitting: Oncology

## 2011-09-09 DIAGNOSIS — D693 Immune thrombocytopenic purpura: Secondary | ICD-10-CM

## 2011-09-09 LAB — CBC WITH DIFFERENTIAL/PLATELET
BASO%: 0.5 % (ref 0.0–2.0)
Eosinophils Absolute: 0 10*3/uL (ref 0.0–0.5)
HCT: 42.5 % (ref 38.4–49.9)
LYMPH%: 37.2 % (ref 14.0–49.0)
MCHC: 33.9 g/dL (ref 32.0–36.0)
MCV: 85.7 fL (ref 79.3–98.0)
MONO#: 0.6 10*3/uL (ref 0.1–0.9)
MONO%: 25.6 % — ABNORMAL HIGH (ref 0.0–14.0)
NEUT%: 36.2 % — ABNORMAL LOW (ref 39.0–75.0)
Platelets: 69 10*3/uL — ABNORMAL LOW (ref 140–400)
RBC: 4.96 10*6/uL (ref 4.20–5.82)

## 2011-09-16 ENCOUNTER — Other Ambulatory Visit: Payer: Self-pay | Admitting: Oncology

## 2011-09-16 ENCOUNTER — Encounter (HOSPITAL_BASED_OUTPATIENT_CLINIC_OR_DEPARTMENT_OTHER): Payer: Medicare Other | Admitting: Oncology

## 2011-09-16 DIAGNOSIS — D693 Immune thrombocytopenic purpura: Secondary | ICD-10-CM

## 2011-09-16 DIAGNOSIS — Z5189 Encounter for other specified aftercare: Secondary | ICD-10-CM

## 2011-09-16 LAB — CBC WITH DIFFERENTIAL/PLATELET
BASO%: 0.7 % (ref 0.0–2.0)
EOS%: 0.4 % (ref 0.0–7.0)
HCT: 43.1 % (ref 38.4–49.9)
LYMPH%: 35.3 % (ref 14.0–49.0)
MCH: 29.1 pg (ref 27.2–33.4)
MCHC: 33.6 g/dL (ref 32.0–36.0)
MONO#: 0.8 10*3/uL (ref 0.1–0.9)
NEUT%: 34.8 % — ABNORMAL LOW (ref 39.0–75.0)
Platelets: 53 10*3/uL — ABNORMAL LOW (ref 140–400)
RBC: 4.98 10*6/uL (ref 4.20–5.82)
WBC: 2.8 10*3/uL — ABNORMAL LOW (ref 4.0–10.3)
nRBC: 0 % (ref 0–0)

## 2011-09-23 ENCOUNTER — Other Ambulatory Visit: Payer: Self-pay | Admitting: Oncology

## 2011-09-23 ENCOUNTER — Encounter (HOSPITAL_BASED_OUTPATIENT_CLINIC_OR_DEPARTMENT_OTHER): Payer: Medicare Other | Admitting: Oncology

## 2011-09-23 DIAGNOSIS — D693 Immune thrombocytopenic purpura: Secondary | ICD-10-CM

## 2011-09-23 LAB — CBC WITH DIFFERENTIAL/PLATELET
BASO%: 1.2 % (ref 0.0–2.0)
EOS%: 2.5 % (ref 0.0–7.0)
MCH: 29.4 pg (ref 27.2–33.4)
MCHC: 33.9 g/dL (ref 32.0–36.0)
MCV: 86.8 fL (ref 79.3–98.0)
MONO%: 28.9 % — ABNORMAL HIGH (ref 0.0–14.0)
RBC: 4.93 10*6/uL (ref 4.20–5.82)
RDW: 12.4 % (ref 11.0–14.6)
lymph#: 0.8 10*3/uL — ABNORMAL LOW (ref 0.9–3.3)
nRBC: 0 % (ref 0–0)

## 2011-09-30 ENCOUNTER — Other Ambulatory Visit: Payer: Self-pay | Admitting: Oncology

## 2011-09-30 ENCOUNTER — Encounter (HOSPITAL_BASED_OUTPATIENT_CLINIC_OR_DEPARTMENT_OTHER): Payer: Medicare Other | Admitting: Oncology

## 2011-09-30 DIAGNOSIS — D693 Immune thrombocytopenic purpura: Secondary | ICD-10-CM

## 2011-09-30 LAB — CBC WITH DIFFERENTIAL/PLATELET
BASO%: 0.7 % (ref 0.0–2.0)
Eosinophils Absolute: 0 10*3/uL (ref 0.0–0.5)
LYMPH%: 38.3 % (ref 14.0–49.0)
MCHC: 33.7 g/dL (ref 32.0–36.0)
MONO#: 0.8 10*3/uL (ref 0.1–0.9)
NEUT#: 1 10*3/uL — ABNORMAL LOW (ref 1.5–6.5)
Platelets: 52 10*3/uL — ABNORMAL LOW (ref 140–400)
RBC: 5.01 10*6/uL (ref 4.20–5.82)
RDW: 12.5 % (ref 11.0–14.6)
WBC: 3 10*3/uL — ABNORMAL LOW (ref 4.0–10.3)
lymph#: 1.1 10*3/uL (ref 0.9–3.3)
nRBC: 0 % (ref 0–0)

## 2011-10-07 ENCOUNTER — Encounter (HOSPITAL_BASED_OUTPATIENT_CLINIC_OR_DEPARTMENT_OTHER): Payer: Medicare Other | Admitting: Oncology

## 2011-10-07 ENCOUNTER — Other Ambulatory Visit: Payer: Self-pay | Admitting: Oncology

## 2011-10-07 DIAGNOSIS — D693 Immune thrombocytopenic purpura: Secondary | ICD-10-CM

## 2011-10-07 LAB — CBC WITH DIFFERENTIAL/PLATELET
BASO%: 0.5 % (ref 0.0–2.0)
Basophils Absolute: 0 10*3/uL (ref 0.0–0.1)
EOS%: 0.5 % (ref 0.0–7.0)
HGB: 13.8 g/dL (ref 13.0–17.1)
MCH: 29 pg (ref 27.2–33.4)
MONO#: 0.6 10*3/uL (ref 0.1–0.9)
RDW: 12.5 % (ref 11.0–14.6)
WBC: 2 10*3/uL — ABNORMAL LOW (ref 4.0–10.3)
lymph#: 0.9 10*3/uL (ref 0.9–3.3)

## 2011-10-14 ENCOUNTER — Encounter (HOSPITAL_BASED_OUTPATIENT_CLINIC_OR_DEPARTMENT_OTHER): Payer: Medicare Other | Admitting: Oncology

## 2011-10-14 ENCOUNTER — Other Ambulatory Visit: Payer: Self-pay | Admitting: Oncology

## 2011-10-14 DIAGNOSIS — D693 Immune thrombocytopenic purpura: Secondary | ICD-10-CM

## 2011-10-14 LAB — CBC WITH DIFFERENTIAL/PLATELET
Basophils Absolute: 0 10*3/uL (ref 0.0–0.1)
EOS%: 0.7 % (ref 0.0–7.0)
Eosinophils Absolute: 0 10*3/uL (ref 0.0–0.5)
HGB: 14.7 g/dL (ref 13.0–17.1)
LYMPH%: 33.3 % (ref 14.0–49.0)
MCH: 29.1 pg (ref 27.2–33.4)
MCV: 85.9 fL (ref 79.3–98.0)
MONO%: 29.7 % — ABNORMAL HIGH (ref 0.0–14.0)
NEUT#: 1.1 10*3/uL — ABNORMAL LOW (ref 1.5–6.5)
NEUT%: 36 % — ABNORMAL LOW (ref 39.0–75.0)
Platelets: 111 10*3/uL — ABNORMAL LOW (ref 140–400)
RDW: 12.5 % (ref 11.0–14.6)

## 2011-10-21 ENCOUNTER — Other Ambulatory Visit: Payer: Self-pay | Admitting: Oncology

## 2011-10-21 ENCOUNTER — Encounter (HOSPITAL_BASED_OUTPATIENT_CLINIC_OR_DEPARTMENT_OTHER): Payer: Medicare Other | Admitting: Oncology

## 2011-10-21 DIAGNOSIS — D6959 Other secondary thrombocytopenia: Secondary | ICD-10-CM

## 2011-10-21 DIAGNOSIS — D693 Immune thrombocytopenic purpura: Secondary | ICD-10-CM

## 2011-10-21 DIAGNOSIS — Z23 Encounter for immunization: Secondary | ICD-10-CM

## 2011-10-21 LAB — CBC WITH DIFFERENTIAL/PLATELET
BASO%: 0.4 % (ref 0.0–2.0)
EOS%: 0.8 % (ref 0.0–7.0)
HCT: 43.1 % (ref 38.4–49.9)
LYMPH%: 39.5 % (ref 14.0–49.0)
MCH: 29.1 pg (ref 27.2–33.4)
MCHC: 33.9 g/dL (ref 32.0–36.0)
MCV: 85.9 fL (ref 79.3–98.0)
MONO#: 0.8 10*3/uL (ref 0.1–0.9)
MONO%: 30.2 % — ABNORMAL HIGH (ref 0.0–14.0)
NEUT%: 29.1 % — ABNORMAL LOW (ref 39.0–75.0)
Platelets: 85 10*3/uL — ABNORMAL LOW (ref 140–400)
RBC: 5.02 10*6/uL (ref 4.20–5.82)
WBC: 2.5 10*3/uL — ABNORMAL LOW (ref 4.0–10.3)
nRBC: 0 % (ref 0–0)

## 2011-10-21 LAB — COMPREHENSIVE METABOLIC PANEL
ALT: 35 U/L (ref 0–53)
Albumin: 4.1 g/dL (ref 3.5–5.2)
CO2: 24 mEq/L (ref 19–32)
Glucose, Bld: 130 mg/dL — ABNORMAL HIGH (ref 70–99)
Potassium: 3.9 mEq/L (ref 3.5–5.3)
Sodium: 141 mEq/L (ref 135–145)
Total Bilirubin: 0.5 mg/dL (ref 0.3–1.2)
Total Protein: 6.6 g/dL (ref 6.0–8.3)

## 2011-10-21 LAB — MORPHOLOGY

## 2011-10-21 LAB — LACTATE DEHYDROGENASE: LDH: 168 U/L (ref 94–250)

## 2011-10-28 ENCOUNTER — Other Ambulatory Visit (HOSPITAL_BASED_OUTPATIENT_CLINIC_OR_DEPARTMENT_OTHER): Payer: Medicare Other | Admitting: Lab

## 2011-10-28 ENCOUNTER — Ambulatory Visit (HOSPITAL_BASED_OUTPATIENT_CLINIC_OR_DEPARTMENT_OTHER): Payer: Medicare Other

## 2011-10-28 ENCOUNTER — Other Ambulatory Visit: Payer: Self-pay | Admitting: Oncology

## 2011-10-28 DIAGNOSIS — D693 Immune thrombocytopenic purpura: Secondary | ICD-10-CM

## 2011-10-28 LAB — CBC WITH DIFFERENTIAL/PLATELET
BASO%: 0.7 % (ref 0.0–2.0)
HCT: 43.5 % (ref 38.4–49.9)
HGB: 14.8 g/dL (ref 13.0–17.1)
MCHC: 34 g/dL (ref 32.0–36.0)
MONO#: 0.8 10*3/uL (ref 0.1–0.9)
NEUT%: 32.4 % — ABNORMAL LOW (ref 39.0–75.0)
RDW: 12.5 % (ref 11.0–14.6)
WBC: 2.8 10*3/uL — ABNORMAL LOW (ref 4.0–10.3)
lymph#: 1 10*3/uL (ref 0.9–3.3)

## 2011-10-28 MED ORDER — ROMIPLOSTIM INJECTION 500 MCG
7.0000 ug/kg | SUBCUTANEOUS | Status: DC
  Administered 2011-10-28: 665 ug via SUBCUTANEOUS
  Filled 2011-10-28: qty 1.33

## 2011-11-01 ENCOUNTER — Telehealth: Payer: Self-pay | Admitting: Oncology

## 2011-11-01 ENCOUNTER — Other Ambulatory Visit: Payer: Self-pay | Admitting: Oncology

## 2011-11-01 NOTE — Telephone Encounter (Signed)
Called pt left message, reminded pt of appt on 11/16 and requested pt to get a calendar since he has more scheduled appts

## 2011-11-01 NOTE — Telephone Encounter (Signed)
Yes - try to schedule all routine patients for Jan or Feb  Thanks Dr Reece Agar

## 2011-11-02 ENCOUNTER — Encounter: Payer: Self-pay | Admitting: Oncology

## 2011-11-05 ENCOUNTER — Other Ambulatory Visit (HOSPITAL_BASED_OUTPATIENT_CLINIC_OR_DEPARTMENT_OTHER): Payer: Medicare Other

## 2011-11-05 ENCOUNTER — Other Ambulatory Visit: Payer: Self-pay | Admitting: Oncology

## 2011-11-05 ENCOUNTER — Ambulatory Visit (HOSPITAL_BASED_OUTPATIENT_CLINIC_OR_DEPARTMENT_OTHER): Payer: Medicare Other

## 2011-11-05 DIAGNOSIS — D693 Immune thrombocytopenic purpura: Secondary | ICD-10-CM

## 2011-11-05 LAB — CBC WITH DIFFERENTIAL/PLATELET
BASO%: 0.3 % (ref 0.0–2.0)
Basophils Absolute: 0 10*3/uL (ref 0.0–0.1)
Eosinophils Absolute: 0 10*3/uL (ref 0.0–0.5)
HCT: 41.8 % (ref 38.4–49.9)
HGB: 14.1 g/dL (ref 13.0–17.1)
LYMPH%: 31.7 % (ref 14.0–49.0)
MONO#: 0.8 10*3/uL (ref 0.1–0.9)
NEUT#: 1.2 10*3/uL — ABNORMAL LOW (ref 1.5–6.5)
NEUT%: 40.1 % (ref 39.0–75.0)
Platelets: 55 10*3/uL — ABNORMAL LOW (ref 140–400)
WBC: 3.1 10*3/uL — ABNORMAL LOW (ref 4.0–10.3)
lymph#: 1 10*3/uL (ref 0.9–3.3)

## 2011-11-05 MED ORDER — ROMIPLOSTIM INJECTION 500 MCG
665.0000 ug | SUBCUTANEOUS | Status: DC
  Administered 2011-11-05: 665 ug via SUBCUTANEOUS
  Filled 2011-11-05: qty 1.33

## 2011-11-10 ENCOUNTER — Other Ambulatory Visit: Payer: Self-pay | Admitting: Oncology

## 2011-11-12 ENCOUNTER — Ambulatory Visit (HOSPITAL_BASED_OUTPATIENT_CLINIC_OR_DEPARTMENT_OTHER): Payer: Medicare Other

## 2011-11-12 ENCOUNTER — Other Ambulatory Visit (HOSPITAL_BASED_OUTPATIENT_CLINIC_OR_DEPARTMENT_OTHER): Payer: Medicare Other | Admitting: Lab

## 2011-11-12 DIAGNOSIS — D693 Immune thrombocytopenic purpura: Secondary | ICD-10-CM

## 2011-11-12 LAB — CBC WITH DIFFERENTIAL/PLATELET
Basophils Absolute: 0 10*3/uL (ref 0.0–0.1)
Eosinophils Absolute: 0 10*3/uL (ref 0.0–0.5)
HGB: 14.5 g/dL (ref 13.0–17.1)
MCV: 86.1 fL (ref 79.3–98.0)
MONO#: 0.6 10*3/uL (ref 0.1–0.9)
MONO%: 24.2 % — ABNORMAL HIGH (ref 0.0–14.0)
NEUT#: 0.9 10*3/uL — ABNORMAL LOW (ref 1.5–6.5)
RBC: 4.97 10*6/uL (ref 4.20–5.82)
RDW: 12.5 % (ref 11.0–14.6)
WBC: 2.5 10*3/uL — ABNORMAL LOW (ref 4.0–10.3)
lymph#: 1 10*3/uL (ref 0.9–3.3)
nRBC: 0 % (ref 0–0)

## 2011-11-12 MED ORDER — ROMIPLOSTIM INJECTION 500 MCG
665.0000 ug | SUBCUTANEOUS | Status: DC
  Administered 2011-11-12: 665 ug via SUBCUTANEOUS
  Filled 2011-11-12: qty 1.33

## 2011-11-19 ENCOUNTER — Ambulatory Visit (HOSPITAL_BASED_OUTPATIENT_CLINIC_OR_DEPARTMENT_OTHER): Payer: Medicare Other

## 2011-11-19 ENCOUNTER — Other Ambulatory Visit: Payer: Self-pay | Admitting: Oncology

## 2011-11-19 ENCOUNTER — Other Ambulatory Visit (HOSPITAL_BASED_OUTPATIENT_CLINIC_OR_DEPARTMENT_OTHER): Payer: Medicare Other | Admitting: Lab

## 2011-11-19 DIAGNOSIS — D693 Immune thrombocytopenic purpura: Secondary | ICD-10-CM

## 2011-11-19 LAB — CBC WITH DIFFERENTIAL/PLATELET
BASO%: 0.4 % (ref 0.0–2.0)
Basophils Absolute: 0 10*3/uL (ref 0.0–0.1)
EOS%: 0.8 % (ref 0.0–7.0)
HCT: 42.7 % (ref 38.4–49.9)
HGB: 14.5 g/dL (ref 13.0–17.1)
LYMPH%: 36.1 % (ref 14.0–49.0)
MCH: 29.5 pg (ref 27.2–33.4)
MCHC: 34 g/dL (ref 32.0–36.0)
MCV: 87 fL (ref 79.3–98.0)
MONO%: 26.6 % — ABNORMAL HIGH (ref 0.0–14.0)
NEUT%: 36.1 % — ABNORMAL LOW (ref 39.0–75.0)

## 2011-11-19 MED ORDER — ROMIPLOSTIM INJECTION 500 MCG
7.0000 ug/kg | SUBCUTANEOUS | Status: DC
  Administered 2011-11-19: 665 ug via SUBCUTANEOUS
  Filled 2011-11-19: qty 1.33

## 2011-11-26 ENCOUNTER — Ambulatory Visit (HOSPITAL_BASED_OUTPATIENT_CLINIC_OR_DEPARTMENT_OTHER): Payer: Medicare Other

## 2011-11-26 ENCOUNTER — Other Ambulatory Visit (HOSPITAL_BASED_OUTPATIENT_CLINIC_OR_DEPARTMENT_OTHER): Payer: Medicare Other | Admitting: Lab

## 2011-11-26 DIAGNOSIS — D693 Immune thrombocytopenic purpura: Secondary | ICD-10-CM

## 2011-11-26 DIAGNOSIS — D6959 Other secondary thrombocytopenia: Secondary | ICD-10-CM

## 2011-11-26 LAB — CBC WITH DIFFERENTIAL/PLATELET
BASO%: 0.3 % (ref 0.0–2.0)
EOS%: 0.3 % (ref 0.0–7.0)
Eosinophils Absolute: 0 10*3/uL (ref 0.0–0.5)
LYMPH%: 34 % (ref 14.0–49.0)
MCH: 29.3 pg (ref 27.2–33.4)
MCHC: 33.8 g/dL (ref 32.0–36.0)
MCV: 86.6 fL (ref 79.3–98.0)
MONO%: 26.3 % — ABNORMAL HIGH (ref 0.0–14.0)
Platelets: 62 10*3/uL — ABNORMAL LOW (ref 140–400)
RBC: 4.78 10*6/uL (ref 4.20–5.82)
nRBC: 0 % (ref 0–0)

## 2011-11-26 MED ORDER — ROMIPLOSTIM INJECTION 500 MCG
658.0000 ug | SUBCUTANEOUS | Status: DC
  Administered 2011-11-26: 658 ug via SUBCUTANEOUS
  Filled 2011-11-26: qty 1.32

## 2011-12-03 ENCOUNTER — Other Ambulatory Visit: Payer: Medicare Other | Admitting: Lab

## 2011-12-03 ENCOUNTER — Other Ambulatory Visit (HOSPITAL_BASED_OUTPATIENT_CLINIC_OR_DEPARTMENT_OTHER): Payer: Medicare Other

## 2011-12-03 ENCOUNTER — Other Ambulatory Visit: Payer: Self-pay | Admitting: Oncology

## 2011-12-03 ENCOUNTER — Ambulatory Visit (HOSPITAL_BASED_OUTPATIENT_CLINIC_OR_DEPARTMENT_OTHER): Payer: Medicare Other

## 2011-12-03 DIAGNOSIS — D693 Immune thrombocytopenic purpura: Secondary | ICD-10-CM

## 2011-12-03 LAB — CBC WITH DIFFERENTIAL/PLATELET
Eosinophils Absolute: 0 10*3/uL (ref 0.0–0.5)
MONO#: 0.9 10*3/uL (ref 0.1–0.9)
NEUT#: 1 10*3/uL — ABNORMAL LOW (ref 1.5–6.5)
RBC: 4.85 10*6/uL (ref 4.20–5.82)
RDW: 12.5 % (ref 11.0–14.6)
WBC: 2.9 10*3/uL — ABNORMAL LOW (ref 4.0–10.3)
nRBC: 0 % (ref 0–0)

## 2011-12-03 MED ORDER — ROMIPLOSTIM INJECTION 500 MCG
665.0000 ug | SUBCUTANEOUS | Status: DC
  Administered 2011-12-03: 665 ug via SUBCUTANEOUS
  Filled 2011-12-03: qty 1.33

## 2011-12-10 ENCOUNTER — Other Ambulatory Visit (HOSPITAL_BASED_OUTPATIENT_CLINIC_OR_DEPARTMENT_OTHER): Payer: Medicare Other | Admitting: Lab

## 2011-12-10 ENCOUNTER — Ambulatory Visit (HOSPITAL_BASED_OUTPATIENT_CLINIC_OR_DEPARTMENT_OTHER): Payer: Medicare Other

## 2011-12-10 ENCOUNTER — Other Ambulatory Visit: Payer: Self-pay | Admitting: Oncology

## 2011-12-10 DIAGNOSIS — D6959 Other secondary thrombocytopenia: Secondary | ICD-10-CM

## 2011-12-10 DIAGNOSIS — D693 Immune thrombocytopenic purpura: Secondary | ICD-10-CM

## 2011-12-10 LAB — CBC WITH DIFFERENTIAL/PLATELET
BASO%: 0.6 % (ref 0.0–2.0)
Basophils Absolute: 0 10*3/uL (ref 0.0–0.1)
HCT: 43.6 % (ref 38.4–49.9)
HGB: 14.7 g/dL (ref 13.0–17.1)
MCHC: 33.7 g/dL (ref 32.0–36.0)
MONO#: 1 10*3/uL — ABNORMAL HIGH (ref 0.1–0.9)
NEUT%: 39.5 % (ref 39.0–75.0)
WBC: 3.4 10*3/uL — ABNORMAL LOW (ref 4.0–10.3)
lymph#: 1 10*3/uL (ref 0.9–3.3)

## 2011-12-10 MED ORDER — ROMIPLOSTIM INJECTION 500 MCG
7.0000 ug/kg | SUBCUTANEOUS | Status: DC
  Administered 2011-12-10: 665 ug via SUBCUTANEOUS
  Filled 2011-12-10: qty 1.33

## 2011-12-17 ENCOUNTER — Ambulatory Visit (HOSPITAL_BASED_OUTPATIENT_CLINIC_OR_DEPARTMENT_OTHER): Payer: Medicare Other

## 2011-12-17 ENCOUNTER — Other Ambulatory Visit (HOSPITAL_BASED_OUTPATIENT_CLINIC_OR_DEPARTMENT_OTHER): Payer: Medicare Other

## 2011-12-17 ENCOUNTER — Other Ambulatory Visit: Payer: Self-pay | Admitting: Oncology

## 2011-12-17 DIAGNOSIS — D696 Thrombocytopenia, unspecified: Secondary | ICD-10-CM

## 2011-12-17 DIAGNOSIS — D693 Immune thrombocytopenic purpura: Secondary | ICD-10-CM

## 2011-12-17 LAB — CBC WITH DIFFERENTIAL/PLATELET
Basophils Absolute: 0 10*3/uL (ref 0.0–0.1)
EOS%: 0.4 % (ref 0.0–7.0)
Eosinophils Absolute: 0 10*3/uL (ref 0.0–0.5)
HGB: 14.8 g/dL (ref 13.0–17.1)
MCV: 86.1 fL (ref 79.3–98.0)
MONO%: 34.3 % — ABNORMAL HIGH (ref 0.0–14.0)
NEUT#: 0.7 10*3/uL — ABNORMAL LOW (ref 1.5–6.5)
RBC: 5.04 10*6/uL (ref 4.20–5.82)
RDW: 12.4 % (ref 11.0–14.6)
lymph#: 0.8 10*3/uL — ABNORMAL LOW (ref 0.9–3.3)
nRBC: 0 % (ref 0–0)

## 2011-12-17 MED ORDER — ROMIPLOSTIM INJECTION 500 MCG
665.0000 ug | SUBCUTANEOUS | Status: DC
  Administered 2011-12-17: 665 ug via SUBCUTANEOUS
  Filled 2011-12-17: qty 1.33

## 2011-12-23 ENCOUNTER — Other Ambulatory Visit: Payer: Self-pay | Admitting: Oncology

## 2011-12-24 ENCOUNTER — Other Ambulatory Visit: Payer: Medicare Other

## 2011-12-24 ENCOUNTER — Ambulatory Visit (HOSPITAL_BASED_OUTPATIENT_CLINIC_OR_DEPARTMENT_OTHER): Payer: Medicare Other

## 2011-12-24 DIAGNOSIS — D6959 Other secondary thrombocytopenia: Secondary | ICD-10-CM

## 2011-12-24 LAB — CBC WITH DIFFERENTIAL/PLATELET
BASO%: 1 % (ref 0.0–2.0)
Eosinophils Absolute: 0 10*3/uL (ref 0.0–0.5)
HCT: 44.6 % (ref 38.4–49.9)
MCHC: 33.6 g/dL (ref 32.0–36.0)
MONO#: 1 10*3/uL — ABNORMAL HIGH (ref 0.1–0.9)
NEUT#: 1 10*3/uL — ABNORMAL LOW (ref 1.5–6.5)
RBC: 5.1 10*6/uL (ref 4.20–5.82)
WBC: 3.1 10*3/uL — ABNORMAL LOW (ref 4.0–10.3)
lymph#: 1.1 10*3/uL (ref 0.9–3.3)
nRBC: 0 % (ref 0–0)

## 2011-12-24 MED ORDER — ROMIPLOSTIM INJECTION 500 MCG
665.0000 ug | SUBCUTANEOUS | Status: DC
  Administered 2011-12-24: 665 ug via SUBCUTANEOUS
  Filled 2011-12-24: qty 1.33

## 2011-12-31 ENCOUNTER — Other Ambulatory Visit: Payer: Self-pay | Admitting: Oncology

## 2011-12-31 ENCOUNTER — Other Ambulatory Visit (HOSPITAL_BASED_OUTPATIENT_CLINIC_OR_DEPARTMENT_OTHER): Payer: Medicare Other

## 2011-12-31 ENCOUNTER — Ambulatory Visit (HOSPITAL_BASED_OUTPATIENT_CLINIC_OR_DEPARTMENT_OTHER): Payer: Medicare Other

## 2011-12-31 DIAGNOSIS — D693 Immune thrombocytopenic purpura: Secondary | ICD-10-CM

## 2011-12-31 DIAGNOSIS — D696 Thrombocytopenia, unspecified: Secondary | ICD-10-CM

## 2011-12-31 LAB — CBC WITH DIFFERENTIAL/PLATELET
Eosinophils Absolute: 0 10*3/uL (ref 0.0–0.5)
HGB: 14.4 g/dL (ref 13.0–17.1)
MONO#: 0.6 10*3/uL (ref 0.1–0.9)
MONO%: 26.9 % — ABNORMAL HIGH (ref 0.0–14.0)
NEUT#: 0.8 10*3/uL — ABNORMAL LOW (ref 1.5–6.5)
RBC: 4.97 10*6/uL (ref 4.20–5.82)
RDW: 12.3 % (ref 11.0–14.6)
WBC: 2.3 10*3/uL — ABNORMAL LOW (ref 4.0–10.3)
lymph#: 0.9 10*3/uL (ref 0.9–3.3)
nRBC: 0 % (ref 0–0)

## 2011-12-31 MED ORDER — ROMIPLOSTIM INJECTION 500 MCG
665.0000 ug | SUBCUTANEOUS | Status: DC
  Administered 2011-12-31: 665 ug via SUBCUTANEOUS
  Filled 2011-12-31: qty 1.33

## 2012-01-07 ENCOUNTER — Ambulatory Visit (HOSPITAL_BASED_OUTPATIENT_CLINIC_OR_DEPARTMENT_OTHER): Payer: Medicare Other

## 2012-01-07 ENCOUNTER — Other Ambulatory Visit: Payer: Medicare Other | Admitting: Lab

## 2012-01-07 DIAGNOSIS — D6959 Other secondary thrombocytopenia: Secondary | ICD-10-CM

## 2012-01-07 LAB — CBC WITH DIFFERENTIAL/PLATELET
BASO%: 0.8 % (ref 0.0–2.0)
Eosinophils Absolute: 0 10*3/uL (ref 0.0–0.5)
HCT: 43.9 % (ref 38.4–49.9)
HGB: 15 g/dL (ref 13.0–17.1)
MCHC: 34.2 g/dL (ref 32.0–36.0)
MONO#: 0.7 10*3/uL (ref 0.1–0.9)
NEUT#: 0.9 10*3/uL — ABNORMAL LOW (ref 1.5–6.5)
NEUT%: 35.6 % — ABNORMAL LOW (ref 39.0–75.0)
WBC: 2.6 10*3/uL — ABNORMAL LOW (ref 4.0–10.3)
lymph#: 0.9 10*3/uL (ref 0.9–3.3)

## 2012-01-07 MED ORDER — ROMIPLOSTIM INJECTION 500 MCG
665.0000 ug | SUBCUTANEOUS | Status: DC
  Administered 2012-01-07: 665 ug via SUBCUTANEOUS
  Filled 2012-01-07: qty 1.33

## 2012-01-14 ENCOUNTER — Other Ambulatory Visit (HOSPITAL_BASED_OUTPATIENT_CLINIC_OR_DEPARTMENT_OTHER): Payer: Medicare Other | Admitting: Lab

## 2012-01-14 ENCOUNTER — Ambulatory Visit (HOSPITAL_BASED_OUTPATIENT_CLINIC_OR_DEPARTMENT_OTHER): Payer: Medicare Other

## 2012-01-14 DIAGNOSIS — D693 Immune thrombocytopenic purpura: Secondary | ICD-10-CM

## 2012-01-14 DIAGNOSIS — D6959 Other secondary thrombocytopenia: Secondary | ICD-10-CM

## 2012-01-14 LAB — CBC WITH DIFFERENTIAL/PLATELET
Basophils Absolute: 0 10*3/uL (ref 0.0–0.1)
Eosinophils Absolute: 0 10*3/uL (ref 0.0–0.5)
HGB: 14.5 g/dL (ref 13.0–17.1)
MCV: 87.3 fL (ref 79.3–98.0)
MONO%: 25.6 % — ABNORMAL HIGH (ref 0.0–14.0)
NEUT#: 0.7 10*3/uL — ABNORMAL LOW (ref 1.5–6.5)
RDW: 12.5 % (ref 11.0–14.6)
lymph#: 0.9 10*3/uL (ref 0.9–3.3)

## 2012-01-14 MED ORDER — ROMIPLOSTIM INJECTION 500 MCG
665.0000 ug | SUBCUTANEOUS | Status: DC
  Administered 2012-01-14: 665 ug via SUBCUTANEOUS
  Filled 2012-01-14: qty 1.33

## 2012-01-21 ENCOUNTER — Ambulatory Visit (HOSPITAL_BASED_OUTPATIENT_CLINIC_OR_DEPARTMENT_OTHER): Payer: Medicare Other

## 2012-01-21 ENCOUNTER — Other Ambulatory Visit (HOSPITAL_BASED_OUTPATIENT_CLINIC_OR_DEPARTMENT_OTHER): Payer: Medicare Other | Admitting: Lab

## 2012-01-21 DIAGNOSIS — D693 Immune thrombocytopenic purpura: Secondary | ICD-10-CM

## 2012-01-21 LAB — CBC WITH DIFFERENTIAL/PLATELET
BASO%: 0.8 % (ref 0.0–2.0)
HCT: 43.4 % (ref 38.4–49.9)
MCHC: 33.6 g/dL (ref 32.0–36.0)
MONO#: 0.6 10*3/uL (ref 0.1–0.9)
RBC: 4.97 10*6/uL (ref 4.20–5.82)
WBC: 2.4 10*3/uL — ABNORMAL LOW (ref 4.0–10.3)
lymph#: 1 10*3/uL (ref 0.9–3.3)
nRBC: 0 % (ref 0–0)

## 2012-01-21 MED ORDER — ROMIPLOSTIM INJECTION 500 MCG
665.0000 ug | SUBCUTANEOUS | Status: DC
  Administered 2012-01-21: 665 ug via SUBCUTANEOUS
  Filled 2012-01-21: qty 1.33

## 2012-01-28 ENCOUNTER — Ambulatory Visit (HOSPITAL_BASED_OUTPATIENT_CLINIC_OR_DEPARTMENT_OTHER): Payer: Medicare Other

## 2012-01-28 ENCOUNTER — Other Ambulatory Visit (HOSPITAL_BASED_OUTPATIENT_CLINIC_OR_DEPARTMENT_OTHER): Payer: Medicare Other | Admitting: Lab

## 2012-01-28 DIAGNOSIS — D6959 Other secondary thrombocytopenia: Secondary | ICD-10-CM

## 2012-01-28 LAB — MANUAL DIFFERENTIAL
ALC: 1 10*3/uL (ref 0.9–3.3)
ANC (CHCC manual diff): 0.7 10*3/uL — ABNORMAL LOW (ref 1.5–6.5)
Band Neutrophils: 0 % (ref 0–10)
Blasts: 0 % (ref 0–0)
LYMPH: 47 % (ref 14–49)
Variant Lymph: 0 % (ref 0–0)
nRBC: 0 % (ref 0–0)

## 2012-01-28 LAB — CBC WITH DIFFERENTIAL/PLATELET
HCT: 43 % (ref 38.4–49.9)
HGB: 14.7 g/dL (ref 13.0–17.1)
WBC: 2.2 10*3/uL — ABNORMAL LOW (ref 4.0–10.3)

## 2012-01-28 MED ORDER — ROMIPLOSTIM INJECTION 500 MCG
660.0000 ug | SUBCUTANEOUS | Status: DC
  Administered 2012-01-28: 660 ug via SUBCUTANEOUS
  Filled 2012-01-28: qty 1.32

## 2012-02-04 ENCOUNTER — Ambulatory Visit (HOSPITAL_BASED_OUTPATIENT_CLINIC_OR_DEPARTMENT_OTHER): Payer: Medicare Other

## 2012-02-04 ENCOUNTER — Other Ambulatory Visit: Payer: Medicare Other | Admitting: Lab

## 2012-02-04 DIAGNOSIS — D6959 Other secondary thrombocytopenia: Secondary | ICD-10-CM

## 2012-02-04 LAB — CBC WITH DIFFERENTIAL/PLATELET
Basophils Absolute: 0 10*3/uL (ref 0.0–0.1)
EOS%: 0.4 % (ref 0.0–7.0)
Eosinophils Absolute: 0 10*3/uL (ref 0.0–0.5)
HCT: 43.3 % (ref 38.4–49.9)
HGB: 14.6 g/dL (ref 13.0–17.1)
MCH: 29.3 pg (ref 27.2–33.4)
MCV: 86.9 fL (ref 79.3–98.0)
NEUT%: 35.2 % — ABNORMAL LOW (ref 39.0–75.0)
lymph#: 1 10*3/uL (ref 0.9–3.3)

## 2012-02-04 MED ORDER — ROMIPLOSTIM INJECTION 500 MCG
660.0000 ug | SUBCUTANEOUS | Status: DC
  Administered 2012-02-04: 660 ug via SUBCUTANEOUS
  Filled 2012-02-04: qty 1.32

## 2012-02-10 ENCOUNTER — Other Ambulatory Visit (HOSPITAL_BASED_OUTPATIENT_CLINIC_OR_DEPARTMENT_OTHER): Payer: Medicare Other | Admitting: Lab

## 2012-02-10 ENCOUNTER — Ambulatory Visit (HOSPITAL_BASED_OUTPATIENT_CLINIC_OR_DEPARTMENT_OTHER): Payer: Medicare Other

## 2012-02-10 DIAGNOSIS — D6959 Other secondary thrombocytopenia: Secondary | ICD-10-CM

## 2012-02-10 LAB — CBC WITH DIFFERENTIAL/PLATELET
Basophils Absolute: 0 10*3/uL (ref 0.0–0.1)
Eosinophils Absolute: 0 10*3/uL (ref 0.0–0.5)
HCT: 41.7 % (ref 38.4–49.9)
HGB: 14 g/dL (ref 13.0–17.1)
LYMPH%: 26.8 % (ref 14.0–49.0)
MCV: 86.5 fL (ref 79.3–98.0)
MONO%: 27.4 % — ABNORMAL HIGH (ref 0.0–14.0)
NEUT#: 1.6 10*3/uL (ref 1.5–6.5)
NEUT%: 45.2 % (ref 39.0–75.0)
Platelets: 74 10*3/uL — ABNORMAL LOW (ref 140–400)
RBC: 4.82 10*6/uL (ref 4.20–5.82)

## 2012-02-10 MED ORDER — ROMIPLOSTIM INJECTION 500 MCG
660.0000 ug | SUBCUTANEOUS | Status: DC
  Administered 2012-02-10: 660 ug via SUBCUTANEOUS
  Filled 2012-02-10: qty 1.32

## 2012-02-11 ENCOUNTER — Ambulatory Visit: Payer: Medicare Other

## 2012-02-11 ENCOUNTER — Other Ambulatory Visit: Payer: Medicare Other | Admitting: Lab

## 2012-02-17 ENCOUNTER — Other Ambulatory Visit: Payer: Self-pay | Admitting: Oncology

## 2012-02-18 ENCOUNTER — Other Ambulatory Visit: Payer: Self-pay | Admitting: *Deleted

## 2012-02-18 ENCOUNTER — Ambulatory Visit (HOSPITAL_BASED_OUTPATIENT_CLINIC_OR_DEPARTMENT_OTHER): Payer: Medicare Other

## 2012-02-18 ENCOUNTER — Telehealth: Payer: Self-pay | Admitting: Oncology

## 2012-02-18 ENCOUNTER — Other Ambulatory Visit: Payer: Medicare Other | Admitting: Lab

## 2012-02-18 DIAGNOSIS — D6959 Other secondary thrombocytopenia: Secondary | ICD-10-CM

## 2012-02-18 LAB — CBC WITH DIFFERENTIAL/PLATELET
EOS%: 0.5 % (ref 0.0–7.0)
Eosinophils Absolute: 0 10*3/uL (ref 0.0–0.5)
LYMPH%: 42 % (ref 14.0–49.0)
MCH: 29.4 pg (ref 27.2–33.4)
MCHC: 34 g/dL (ref 32.0–36.0)
MCV: 86.7 fL (ref 79.3–98.0)
MONO%: 27.5 % — ABNORMAL HIGH (ref 0.0–14.0)
NEUT#: 0.6 10*3/uL — ABNORMAL LOW (ref 1.5–6.5)
Platelets: 65 10*3/uL — ABNORMAL LOW (ref 140–400)
RBC: 4.89 10*6/uL (ref 4.20–5.82)
nRBC: 0 % (ref 0–0)

## 2012-02-18 MED ORDER — ROMIPLOSTIM INJECTION 500 MCG
660.0000 ug | SUBCUTANEOUS | Status: DC
Start: 2012-02-18 — End: 2012-02-18
  Administered 2012-02-18: 660 ug via SUBCUTANEOUS
  Filled 2012-02-18: qty 1.32

## 2012-02-18 NOTE — Telephone Encounter (Signed)
Talked to pt, gave him appt for 3/8 md,lab and injection, also gave him appt for weekly injection until end of March 2013

## 2012-02-25 ENCOUNTER — Other Ambulatory Visit: Payer: Self-pay | Admitting: Pharmacist

## 2012-02-25 ENCOUNTER — Ambulatory Visit (HOSPITAL_BASED_OUTPATIENT_CLINIC_OR_DEPARTMENT_OTHER): Payer: Medicare Other | Admitting: Oncology

## 2012-02-25 ENCOUNTER — Other Ambulatory Visit (HOSPITAL_BASED_OUTPATIENT_CLINIC_OR_DEPARTMENT_OTHER): Payer: Medicare Other | Admitting: Lab

## 2012-02-25 VITALS — BP 144/81 | HR 65 | Temp 99.0°F | Ht 71.5 in | Wt 212.1 lb

## 2012-02-25 DIAGNOSIS — D6959 Other secondary thrombocytopenia: Secondary | ICD-10-CM

## 2012-02-25 DIAGNOSIS — D693 Immune thrombocytopenic purpura: Secondary | ICD-10-CM

## 2012-02-25 DIAGNOSIS — I10 Essential (primary) hypertension: Secondary | ICD-10-CM

## 2012-02-25 LAB — CBC WITH DIFFERENTIAL/PLATELET
BASO%: 0.3 % (ref 0.0–2.0)
EOS%: 0.3 % (ref 0.0–7.0)
HCT: 42.2 % (ref 38.4–49.9)
LYMPH%: 36.1 % (ref 14.0–49.0)
MCH: 29.3 pg (ref 27.2–33.4)
MCHC: 33.9 g/dL (ref 32.0–36.0)
NEUT%: 36.5 % — ABNORMAL LOW (ref 39.0–75.0)
RBC: 4.88 10*6/uL (ref 4.20–5.82)
lymph#: 1.2 10*3/uL (ref 0.9–3.3)

## 2012-02-25 MED ORDER — ROMIPLOSTIM INJECTION 500 MCG
6.9000 ug/kg | Freq: Once | SUBCUTANEOUS | Status: AC
  Administered 2012-02-25: 660 ug via SUBCUTANEOUS
  Filled 2012-02-25: qty 1.32

## 2012-02-27 ENCOUNTER — Encounter: Payer: Self-pay | Admitting: Oncology

## 2012-02-27 DIAGNOSIS — I10 Essential (primary) hypertension: Secondary | ICD-10-CM | POA: Insufficient documentation

## 2012-02-27 DIAGNOSIS — D693 Immune thrombocytopenic purpura: Secondary | ICD-10-CM

## 2012-02-27 DIAGNOSIS — D696 Thrombocytopenia, unspecified: Secondary | ICD-10-CM | POA: Insufficient documentation

## 2012-02-27 HISTORY — DX: Immune thrombocytopenic purpura: D69.3

## 2012-02-27 NOTE — Progress Notes (Signed)
Hematology and Oncology Follow Up Visit  Billy [name] [id number] 18-Nov-1941 71 y.o. 02/27/2012 10:51 AM   Principle Diagnosis: Encounter Diagnosis  Name Primary?  . Chronic ITP (idiopathic thrombocytopenia) Yes     Interim History:    Followup visit for this 71 year old retired [employer] diagnosed with ITP 4 years ago in February 2009.  He had a mild thrombocytopenia documented as early as March 2007, when platelet count was 123,000.  A CBC done in February 2009 showed platelets down to 42,000.     He was treated with observation alone until November 2009 when platelet count drifted down to 25,000.  He had a minimal response to steroids and no improvement with addition of danazol added in January 2010.  Due to the development of some atypical features with leucopenia and monocytosis I did a bone marrow aspiration and biopsy on 06/12/2009 to exclude additional pathology.  Bone marrow and cytogenetic studies on the marrow were normal.  No excess blasts.  No excess plasma cells.     He was given a trial of Rituxan antibody between 07/01/2009 and 07/22/2009.  He had no response.  He was started on Nplate injections on 10/10/2009 and remains on weekly Nplate at this time.  We have been able to maintain his platelet counts above 50,000 with peak counts as high as 144,000 on this product.  He is tolerating it well with no obvious side effects.  His hemoglobin remains normal.  His white count remains low but stable in the 2700 to 3600 range.  He has a persistent monocytosis in the 20 to 29% range.   He has had no interim medical problems. He has now been on N- plate for 4-0/9 years.     Medications: reviewed  Allergies:  Allergies  Allergen Reactions  . Penicillins Other (See Comments)    Doesn't remember was infant    Review of Systems: Constitutional:   Intermittent fatigue Respiratory: No cough or dyspnea Cardiovascular:  No chest pain or palpitations Gastrointestinal: No change  in bowel habit Genito-Urinary: No hematuria Musculoskeletal: No muscle or bone pain Neurologic: No headache or change in vision Skin: No rash or ecchymoses Remaining ROS negative.  Physical Exam: Blood pressure 144/81, pulse 65, temperature 99 F (37.2 C), temperature source Oral, height 5' 11.5" (1.816 m), weight 212 lb 1.6 oz (96.208 kg). Wt Readings from Last 3 Encounters:  02/25/12 212 lb 1.6 oz (96.208 kg)  02/04/12 209 lb (94.802 kg)  01/28/12 209 lb (94.802 kg)     General appearance: Well-nourished Caucasian man  [name]: Pharynx no erythema or exudate Lymph nodes: No adenopathy Breasts: Lungs: Clear to auscultation resonant to percussion Heart: Regular cardiac rhythm no murmur Abdomen: Soft nontender no mass no organomegaly Extremities: No edema no calf tenderness Vascular: No cyanosis Neurologic: No focal deficit Skin: No rash or ecchymosis  Lab Results: Lab Results  Component Value Date   WBC 3.2* 02/25/2012   HGB 14.3 02/25/2012   HCT 42.2 02/25/2012   MCV 86.5 02/25/2012   PLT 79* 02/25/2012     Chemistry      Component Value Date/Time   NA 141 10/21/2011 1023   NA 141 10/21/2011 1023   K 3.9 10/21/2011 1023   K 3.9 10/21/2011 1023   CL 105 10/21/2011 1023   CL 105 10/21/2011 1023   CO2 24 10/21/2011 1023   CO2 24 10/21/2011 1023   BUN 13 10/21/2011 1023   BUN 13 10/21/2011 1023   CREATININE 0.90 10/21/2011 1023  CREATININE 0.90 10/21/2011 1023      Component Value Date/Time   CALCIUM 9.0 10/21/2011 1023   CALCIUM 9.0 10/21/2011 1023   ALKPHOS 45 10/21/2011 1023   ALKPHOS 45 10/21/2011 1023   AST 24 10/21/2011 1023   AST 24 10/21/2011 1023   ALT 35 10/21/2011 1023   ALT 35 10/21/2011 1023   BILITOT 0.5 10/21/2011 1023   BILITOT 0.5 10/21/2011 1023       Impression and Plan: #1. Chronic ITP Once again I discussed with him alternatives to weekly N-plate injections. Splenectomy remains a good option with greater than 50% long-term complete remissions. At this point, the  chance of a spontaneous remission is a low. He had a relatively had a splenectomy for ITP and it didn't work so his judgment is influenced by this. He does not want to have a splenectomy. He is willing to consider the oral platelets stimulating drug Promacta. I reassured him despite initial blackbox warnings about liver failure, there have been no post marketing reports of liver failure in people who had normal liver function to begin with. His baseline liver chemistries today remain normal. I encouraged him to try the Promacta and we will process a prescription for him. The starting dose is 50 mg daily. After is on the drug for about 3 weeks. The endplate and see if he can maintain a response on the new drug.  #2. Essential hypertension controlled on current medication   CC:. Dr. Elias Else   Levert Feinstein, MD 3/10/201310:51 AM

## 2012-02-29 ENCOUNTER — Telehealth: Payer: Self-pay | Admitting: Oncology

## 2012-02-29 NOTE — Telephone Encounter (Signed)
Talked to pt, gave him appt date for March and April 2013, he will come on 3/15 to get appt calendar

## 2012-03-01 ENCOUNTER — Telehealth: Payer: Self-pay | Admitting: *Deleted

## 2012-03-01 NOTE — Telephone Encounter (Signed)
Bryan/Pharm/[facility] called back & reports that pt looked at figures & is paying more for N-Plate than he thought & is willing to try the promacta & will pick up tomorrow.  Dr. Cyndie Chime notified.

## 2012-03-01 NOTE — Telephone Encounter (Signed)
Correction on 03/01/12 2:34pm note  Pt is leaning toward staying on the N-PLate instead of promacta.

## 2012-03-01 NOTE — Telephone Encounter (Signed)
Received call from [place], Pharmacist/[facility] stating that promacta 25 mg would cost the pt $776/mo & the pt is weighing the cost of N-Plate vs the promacta & is leaning toward staying on the promacta but will contact [place] with decision.  Billy [name] reports that eventually after pt reaches the donut hole the price will go down to $120/mo & he probably does not qualifiy for any assistance.  Note to Dr. Cyndie Chime.

## 2012-03-02 ENCOUNTER — Telehealth: Payer: Self-pay | Admitting: *Deleted

## 2012-03-02 NOTE — Telephone Encounter (Signed)
Pt called & has his promacta but needs to know when to start.  He is scheduled for N-Plate inj tomorrow 03/03/12.  Message left with Dr Cyndie Chime.

## 2012-03-03 ENCOUNTER — Other Ambulatory Visit (HOSPITAL_BASED_OUTPATIENT_CLINIC_OR_DEPARTMENT_OTHER): Payer: Medicare Other

## 2012-03-03 ENCOUNTER — Telehealth: Payer: Self-pay | Admitting: *Deleted

## 2012-03-03 ENCOUNTER — Other Ambulatory Visit: Payer: Self-pay | Admitting: *Deleted

## 2012-03-03 ENCOUNTER — Ambulatory Visit: Payer: Medicare Other

## 2012-03-03 ENCOUNTER — Ambulatory Visit (HOSPITAL_BASED_OUTPATIENT_CLINIC_OR_DEPARTMENT_OTHER): Payer: Medicare Other

## 2012-03-03 DIAGNOSIS — D693 Immune thrombocytopenic purpura: Secondary | ICD-10-CM

## 2012-03-03 LAB — COMPREHENSIVE METABOLIC PANEL
ALT: 24 U/L (ref 0–53)
AST: 22 U/L (ref 0–37)
CO2: 25 mEq/L (ref 19–32)
Creatinine, Ser: 1.01 mg/dL (ref 0.50–1.35)
Total Bilirubin: 0.5 mg/dL (ref 0.3–1.2)

## 2012-03-03 LAB — CBC WITH DIFFERENTIAL/PLATELET
BASO%: 0 % (ref 0.0–2.0)
EOS%: 0.6 % (ref 0.0–7.0)
LYMPH%: 29.5 % (ref 14.0–49.0)
MCHC: 33.7 g/dL (ref 32.0–36.0)
MCV: 86.6 fL (ref 79.3–98.0)
MONO%: 38.2 % — ABNORMAL HIGH (ref 0.0–14.0)
Platelets: 80 10*3/uL — ABNORMAL LOW (ref 140–400)
RBC: 4.7 10*6/uL (ref 4.20–5.82)
nRBC: 0 % (ref 0–0)

## 2012-03-03 MED ORDER — ROMIPLOSTIM INJECTION 500 MCG
660.0000 ug | SUBCUTANEOUS | Status: DC
  Administered 2012-03-03: 660 ug via SUBCUTANEOUS
  Filled 2012-03-03: qty 1.32

## 2012-03-03 NOTE — Telephone Encounter (Signed)
Received call from pt asking again about starting promacta & discussed with Dr Cyndie Chime & pt notified to start promacta but continue N-Plate for now b/c it may take a couple of weeks for promacta to kick in.  Pt reports that he got something from his insurance stating that the promacta was only approved for 1 mo.  Discussed with Elizabeth/[facility] & she thinks that this may be for the outpt. Pharmacy & we will have to call & probably get set up with a Specialty Pharmacy after the month.

## 2012-03-10 ENCOUNTER — Ambulatory Visit (HOSPITAL_BASED_OUTPATIENT_CLINIC_OR_DEPARTMENT_OTHER): Payer: Medicare Other

## 2012-03-10 ENCOUNTER — Other Ambulatory Visit (HOSPITAL_BASED_OUTPATIENT_CLINIC_OR_DEPARTMENT_OTHER): Payer: Medicare Other | Admitting: Lab

## 2012-03-10 DIAGNOSIS — D693 Immune thrombocytopenic purpura: Secondary | ICD-10-CM

## 2012-03-10 LAB — CBC WITH DIFFERENTIAL/PLATELET
BASO%: 0.3 % (ref 0.0–2.0)
HCT: 42.9 % (ref 38.4–49.9)
LYMPH%: 35.8 % (ref 14.0–49.0)
MCHC: 33.8 g/dL (ref 32.0–36.0)
MONO#: 1 10*3/uL — ABNORMAL HIGH (ref 0.1–0.9)
NEUT%: 33.8 % — ABNORMAL LOW (ref 39.0–75.0)
Platelets: 103 10*3/uL — ABNORMAL LOW (ref 140–400)
WBC: 3.5 10*3/uL — ABNORMAL LOW (ref 4.0–10.3)

## 2012-03-10 MED ORDER — ROMIPLOSTIM INJECTION 500 MCG
7.0000 ug/kg | SUBCUTANEOUS | Status: DC
  Administered 2012-03-10: 675 ug via SUBCUTANEOUS
  Filled 2012-03-10: qty 1.35

## 2012-03-13 ENCOUNTER — Telehealth: Payer: Self-pay

## 2012-03-13 NOTE — Telephone Encounter (Signed)
Message left for pt to contact our office re: treatment plan. dph

## 2012-03-13 NOTE — Telephone Encounter (Signed)
Message copied by Albertha Ghee on Mon Mar 13, 2012  1:19 PM ------      Message from: Levert Feinstein      Created: Fri Mar 10, 2012  5:55 PM       Call pt OK to stop N-plate injections; continue promacta; continue weekly lab for next 4 wks

## 2012-03-17 ENCOUNTER — Telehealth: Payer: Self-pay | Admitting: *Deleted

## 2012-03-17 ENCOUNTER — Other Ambulatory Visit (HOSPITAL_BASED_OUTPATIENT_CLINIC_OR_DEPARTMENT_OTHER): Payer: Medicare Other | Admitting: Lab

## 2012-03-17 ENCOUNTER — Other Ambulatory Visit: Payer: Self-pay | Admitting: *Deleted

## 2012-03-17 ENCOUNTER — Ambulatory Visit: Payer: Medicare Other

## 2012-03-17 DIAGNOSIS — D693 Immune thrombocytopenic purpura: Secondary | ICD-10-CM

## 2012-03-17 LAB — CBC WITH DIFFERENTIAL/PLATELET
Basophils Absolute: 0 10*3/uL (ref 0.0–0.1)
Eosinophils Absolute: 0 10*3/uL (ref 0.0–0.5)
HCT: 42.6 % (ref 38.4–49.9)
HGB: 14.2 g/dL (ref 13.0–17.1)
LYMPH%: 37.1 % (ref 14.0–49.0)
MCV: 86.9 fL (ref 79.3–98.0)
MONO%: 25.8 % — ABNORMAL HIGH (ref 0.0–14.0)
NEUT#: 1 10*3/uL — ABNORMAL LOW (ref 1.5–6.5)
NEUT%: 35.6 % — ABNORMAL LOW (ref 39.0–75.0)
Platelets: 103 10*3/uL — ABNORMAL LOW (ref 140–400)
RBC: 4.9 10*6/uL (ref 4.20–5.82)

## 2012-03-17 MED ORDER — ROMIPLOSTIM 250 MCG ~~LOC~~ SOLR: 7.0000 ug/kg | SUBCUTANEOUS | Status: DC

## 2012-03-17 NOTE — Progress Notes (Signed)
We have been trying to reach pt to give instructions on promacta & N-plate & labs.  Spoke to pt in injection room today.  N-Plate stopped for now & understands to cont. promacta & labs weekly x 4wks.  He wants to make sure that his liver enzymes are checked & will check with Dr. Cyndie Chime on this.  He also was concerned that his insurance co only approved his promacta through mid April.  We had [facility] [facility]/Elizabeth talk with him & she reports that when it is due for a refill, the script may have to go to a specialty pharmacy but won't know until we reorder.

## 2012-03-17 NOTE — Telephone Encounter (Signed)
per orders from 03-17-2012 cancel patient's injection appointment for the next three weeks but keep the lab appointments

## 2012-03-17 NOTE — Telephone Encounter (Signed)
Message copied by Sabino Snipes on Fri Mar 17, 2012 11:15 AM ------      Message from: Levert Feinstein      Created: Fri Mar 10, 2012  5:55 PM       Call pt OK to stop N-plate injections; continue promacta; continue weekly lab for next 4 wks

## 2012-03-17 NOTE — Progress Notes (Signed)
No N-Plate given today as requested by Dr. Lindwood Qua, RN. Pt instructed to continue Promacta and his weekly labs for the next 4weeks. Pt given a copy of today's labs and instructions. Verbalized understanding of appointments and the tx plan.

## 2012-03-17 NOTE — Telephone Encounter (Signed)
Message copied by Sabino Snipes on Fri Mar 17, 2012 11:55 AM ------      Message from: Levert Feinstein      Created: Fri Mar 10, 2012  5:55 PM       Call pt OK to stop N-plate injections; continue promacta; continue weekly lab for next 4 wks

## 2012-03-20 ENCOUNTER — Other Ambulatory Visit: Payer: Self-pay | Admitting: Oncology

## 2012-03-20 DIAGNOSIS — Z5181 Encounter for therapeutic drug level monitoring: Secondary | ICD-10-CM

## 2012-03-21 ENCOUNTER — Telehealth: Payer: Self-pay | Admitting: *Deleted

## 2012-03-21 NOTE — Telephone Encounter (Signed)
printed out calendar and gave to the patient 

## 2012-03-24 ENCOUNTER — Other Ambulatory Visit (HOSPITAL_BASED_OUTPATIENT_CLINIC_OR_DEPARTMENT_OTHER): Payer: Medicare Other | Admitting: Lab

## 2012-03-24 ENCOUNTER — Ambulatory Visit: Payer: Medicare Other

## 2012-03-24 DIAGNOSIS — D693 Immune thrombocytopenic purpura: Secondary | ICD-10-CM

## 2012-03-24 LAB — CBC WITH DIFFERENTIAL/PLATELET
Basophils Absolute: 0 10*3/uL (ref 0.0–0.1)
EOS%: 0.3 % (ref 0.0–7.0)
HCT: 42.7 % (ref 38.4–49.9)
HGB: 14.4 g/dL (ref 13.0–17.1)
MCH: 29.4 pg (ref 27.2–33.4)
NEUT%: 33.6 % — ABNORMAL LOW (ref 39.0–75.0)
lymph#: 0.9 10*3/uL (ref 0.9–3.3)

## 2012-03-31 ENCOUNTER — Ambulatory Visit: Payer: Medicare Other

## 2012-03-31 ENCOUNTER — Encounter: Payer: Self-pay | Admitting: Oncology

## 2012-03-31 ENCOUNTER — Other Ambulatory Visit (HOSPITAL_BASED_OUTPATIENT_CLINIC_OR_DEPARTMENT_OTHER): Payer: Medicare Other | Admitting: Lab

## 2012-03-31 ENCOUNTER — Encounter: Payer: Self-pay | Admitting: *Deleted

## 2012-03-31 DIAGNOSIS — Z5181 Encounter for therapeutic drug level monitoring: Secondary | ICD-10-CM

## 2012-03-31 DIAGNOSIS — D693 Immune thrombocytopenic purpura: Secondary | ICD-10-CM

## 2012-03-31 LAB — CBC WITH DIFFERENTIAL/PLATELET
BASO%: 0.4 % (ref 0.0–2.0)
Eosinophils Absolute: 0 10*3/uL (ref 0.0–0.5)
MONO#: 0.8 10*3/uL (ref 0.1–0.9)
NEUT#: 0.8 10*3/uL — ABNORMAL LOW (ref 1.5–6.5)
RBC: 4.7 10*6/uL (ref 4.20–5.82)
RDW: 12.6 % (ref 11.0–14.6)
WBC: 2.7 10*3/uL — ABNORMAL LOW (ref 4.0–10.3)
nRBC: 0 % (ref 0–0)

## 2012-03-31 LAB — LACTATE DEHYDROGENASE: LDH: 184 U/L (ref 94–250)

## 2012-03-31 LAB — COMPREHENSIVE METABOLIC PANEL
Albumin: 3.9 g/dL (ref 3.5–5.2)
BUN: 15 mg/dL (ref 6–23)
CO2: 27 mEq/L (ref 19–32)
Calcium: 9.1 mg/dL (ref 8.4–10.5)
Chloride: 108 mEq/L (ref 96–112)
Glucose, Bld: 94 mg/dL (ref 70–99)
Potassium: 4.2 mEq/L (ref 3.5–5.3)

## 2012-03-31 LAB — TECHNOLOGIST REVIEW

## 2012-03-31 NOTE — Progress Notes (Signed)
RECEIVED A FAX FROM [facility] CONCERNING A PRIOR AUTHORIZATION FOR PROMACTA.THIS REQUEST WAS GIVEN TO [facility].

## 2012-03-31 NOTE — Progress Notes (Signed)
PROM ACTA 25 MG RX WAS APPROVED FOR ONE MONTH LAST MONTH WHEN I SENT IT TO [facility].  THEY SENT A REQUEST TODAY AND I CALLED [employer] AND SPOKE TO MICHELLE S AND THEN SAUNI, RN AND SHE WILL APPROVE IT FOR A YEAR.  IT WILL BE GOOD UNTIL 03/31/13.  I CALLED [facility] AND LET THEM KNOW.

## 2012-04-03 ENCOUNTER — Telehealth: Payer: Self-pay

## 2012-04-03 NOTE — Telephone Encounter (Signed)
Notified pt of platelet count per Dr Cyndie Chime - "this count is OK; continue Promacta."  Pt verbalizes understanding. dph

## 2012-04-05 NOTE — Progress Notes (Signed)
03/31/12 NEW PA FOR PR0MACTA  25 MG WAS APPROVED FOR 1 YEAR PER SAUNIE @ [employer] FROM 03/31/12 TO 03/31/13.

## 2012-04-07 ENCOUNTER — Ambulatory Visit: Payer: Medicare Other

## 2012-04-07 ENCOUNTER — Other Ambulatory Visit (HOSPITAL_BASED_OUTPATIENT_CLINIC_OR_DEPARTMENT_OTHER): Payer: Medicare Other

## 2012-04-07 ENCOUNTER — Telehealth: Payer: Self-pay | Admitting: *Deleted

## 2012-04-07 ENCOUNTER — Other Ambulatory Visit: Payer: Self-pay | Admitting: *Deleted

## 2012-04-07 DIAGNOSIS — Z5181 Encounter for therapeutic drug level monitoring: Secondary | ICD-10-CM

## 2012-04-07 DIAGNOSIS — D693 Immune thrombocytopenic purpura: Secondary | ICD-10-CM

## 2012-04-07 LAB — CBC WITH DIFFERENTIAL/PLATELET
BASO%: 1.2 % (ref 0.0–2.0)
HGB: 14.2 g/dL (ref 13.0–17.1)
LYMPH%: 21 % (ref 14.0–49.0)
MCH: 29.7 pg (ref 27.2–33.4)
MCHC: 33.3 g/dL (ref 32.0–36.0)
MONO#: 1.9 10*3/uL — ABNORMAL HIGH (ref 0.1–0.9)
MONO%: 33 % — ABNORMAL HIGH (ref 0.0–14.0)
NEUT#: 2.5 10*3/uL (ref 1.5–6.5)
NEUT%: 42.8 % (ref 39.0–75.0)

## 2012-04-07 LAB — COMPREHENSIVE METABOLIC PANEL
AST: 21 U/L (ref 0–37)
BUN: 9 mg/dL (ref 6–23)
CO2: 31 mEq/L (ref 19–32)
Calcium: 8.8 mg/dL (ref 8.4–10.5)
Chloride: 104 mEq/L (ref 96–112)
Creatinine, Ser: 1.05 mg/dL (ref 0.50–1.35)

## 2012-04-07 NOTE — Telephone Encounter (Signed)
Pt reports that he missed 2 days of his promacta, Sun & mon.  This was reported to Dr. Cyndie Chime.

## 2012-04-10 ENCOUNTER — Ambulatory Visit (HOSPITAL_BASED_OUTPATIENT_CLINIC_OR_DEPARTMENT_OTHER): Payer: Medicare Other

## 2012-04-10 ENCOUNTER — Other Ambulatory Visit: Payer: Self-pay | Admitting: *Deleted

## 2012-04-10 ENCOUNTER — Telehealth: Payer: Self-pay | Admitting: Oncology

## 2012-04-10 DIAGNOSIS — D693 Immune thrombocytopenic purpura: Secondary | ICD-10-CM

## 2012-04-10 LAB — CBC WITH DIFFERENTIAL/PLATELET
BASO%: 0.6 % (ref 0.0–2.0)
EOS%: 0.5 % (ref 0.0–7.0)
Eosinophils Absolute: 0 10*3/uL (ref 0.0–0.5)
MCHC: 33.7 g/dL (ref 32.0–36.0)
MCV: 88.9 fL (ref 79.3–98.0)
MONO%: 26.7 % — ABNORMAL HIGH (ref 0.0–14.0)
NEUT#: 3.7 10*3/uL (ref 1.5–6.5)
RBC: 4.78 10*6/uL (ref 4.20–5.82)
RDW: 12.9 % (ref 11.0–14.6)
nRBC: 0 % (ref 0–0)

## 2012-04-10 NOTE — Telephone Encounter (Signed)
Notified pt of platelet count  & instructed to increase dose to max of 75 mg/d since he should be on 50 mg/d.  Pt informed that he is on 25 mg/d & verified with pharmacist @ [facility] that this was the dose given to him; therefore pt informed to increase to 50 mg/d.  Note to Dr. Cyndie Chime.

## 2012-04-10 NOTE — Telephone Encounter (Signed)
Talked to pt, he is coming today at one pm, lab

## 2012-04-14 ENCOUNTER — Other Ambulatory Visit: Payer: Self-pay

## 2012-04-14 ENCOUNTER — Ambulatory Visit: Payer: Medicare Other

## 2012-04-14 ENCOUNTER — Telehealth: Payer: Self-pay | Admitting: Oncology

## 2012-04-14 ENCOUNTER — Other Ambulatory Visit (HOSPITAL_BASED_OUTPATIENT_CLINIC_OR_DEPARTMENT_OTHER): Payer: Medicare Other | Admitting: Lab

## 2012-04-14 DIAGNOSIS — D693 Immune thrombocytopenic purpura: Secondary | ICD-10-CM

## 2012-04-14 DIAGNOSIS — I10 Essential (primary) hypertension: Secondary | ICD-10-CM

## 2012-04-14 LAB — CBC WITH DIFFERENTIAL/PLATELET
BASO%: 0.7 % (ref 0.0–2.0)
Basophils Absolute: 0 10*3/uL (ref 0.0–0.1)
EOS%: 0.6 % (ref 0.0–7.0)
HCT: 41.9 % (ref 38.4–49.9)
HGB: 13.9 g/dL (ref 13.0–17.1)
LYMPH%: 23.3 % (ref 14.0–49.0)
MCH: 29.6 pg (ref 27.2–33.4)
MCHC: 33.3 g/dL (ref 32.0–36.0)
MCV: 89 fL (ref 79.3–98.0)
NEUT%: 49.5 % (ref 39.0–75.0)
Platelets: 45 10*3/uL — ABNORMAL LOW (ref 140–400)

## 2012-04-14 MED ORDER — ELTROMBOPAG OLAMINE 50 MG PO TABS: 50.0000 mg | ORAL_TABLET | Freq: Every day | ORAL | Status: DC

## 2012-04-14 NOTE — Telephone Encounter (Signed)
Per dh cx all inj but not lab as pt is now on pill form. Done    aom

## 2012-04-14 NOTE — Progress Notes (Signed)
Pt send home by [employer] rn.   dmr

## 2012-04-21 ENCOUNTER — Ambulatory Visit: Payer: Medicare Other

## 2012-04-21 ENCOUNTER — Other Ambulatory Visit (HOSPITAL_BASED_OUTPATIENT_CLINIC_OR_DEPARTMENT_OTHER): Payer: Medicare Other

## 2012-04-21 DIAGNOSIS — D696 Thrombocytopenia, unspecified: Secondary | ICD-10-CM

## 2012-04-21 LAB — CBC WITH DIFFERENTIAL/PLATELET
BASO%: 0.9 % (ref 0.0–2.0)
EOS%: 0.6 % (ref 0.0–7.0)
HCT: 42.1 % (ref 38.4–49.9)
MCH: 29.4 pg (ref 27.2–33.4)
MCHC: 32.8 g/dL (ref 32.0–36.0)
MONO#: 1 10*3/uL — ABNORMAL HIGH (ref 0.1–0.9)
RBC: 4.7 10*6/uL (ref 4.20–5.82)
RDW: 13.2 % (ref 11.0–14.6)
WBC: 3.5 10*3/uL — ABNORMAL LOW (ref 4.0–10.3)
lymph#: 1 10*3/uL (ref 0.9–3.3)
nRBC: 0 % (ref 0–0)

## 2012-04-24 ENCOUNTER — Telehealth: Payer: Self-pay

## 2012-04-24 NOTE — Telephone Encounter (Signed)
Message left on home VM per Dr Cyndie Chime - "stay on current dose of Promacta."   dph

## 2012-04-28 ENCOUNTER — Other Ambulatory Visit: Payer: Self-pay | Admitting: Pharmacist

## 2012-04-28 ENCOUNTER — Ambulatory Visit: Payer: Medicare Other

## 2012-04-28 ENCOUNTER — Other Ambulatory Visit (HOSPITAL_BASED_OUTPATIENT_CLINIC_OR_DEPARTMENT_OTHER): Payer: Medicare Other | Admitting: Lab

## 2012-04-28 DIAGNOSIS — D693 Immune thrombocytopenic purpura: Secondary | ICD-10-CM

## 2012-04-28 DIAGNOSIS — Z5181 Encounter for therapeutic drug level monitoring: Secondary | ICD-10-CM

## 2012-04-28 LAB — CBC WITH DIFFERENTIAL/PLATELET
Basophils Absolute: 0 10*3/uL (ref 0.0–0.1)
EOS%: 0.9 % (ref 0.0–7.0)
Eosinophils Absolute: 0 10*3/uL (ref 0.0–0.5)
HGB: 13.7 g/dL (ref 13.0–17.1)
MONO%: 28.9 % — ABNORMAL HIGH (ref 0.0–14.0)
NEUT#: 0.8 10*3/uL — ABNORMAL LOW (ref 1.5–6.5)
RBC: 4.61 10*6/uL (ref 4.20–5.82)
RDW: 13.5 % (ref 11.0–14.6)
WBC: 2.6 10*3/uL — ABNORMAL LOW (ref 4.0–10.3)
lymph#: 1 10*3/uL (ref 0.9–3.3)
nRBC: 0 % (ref 0–0)

## 2012-04-28 LAB — LACTATE DEHYDROGENASE: LDH: 164 U/L (ref 94–250)

## 2012-05-05 ENCOUNTER — Ambulatory Visit: Payer: Medicare Other

## 2012-05-05 ENCOUNTER — Other Ambulatory Visit (HOSPITAL_BASED_OUTPATIENT_CLINIC_OR_DEPARTMENT_OTHER): Payer: Medicare Other | Admitting: Lab

## 2012-05-05 DIAGNOSIS — D693 Immune thrombocytopenic purpura: Secondary | ICD-10-CM

## 2012-05-05 LAB — CBC WITH DIFFERENTIAL/PLATELET
BASO%: 0.8 % (ref 0.0–2.0)
EOS%: 0.7 % (ref 0.0–7.0)
HCT: 42.3 % (ref 38.4–49.9)
HGB: 14.1 g/dL (ref 13.0–17.1)
MCH: 30 pg (ref 27.2–33.4)
MCHC: 33.4 g/dL (ref 32.0–36.0)
MONO#: 0.8 10*3/uL (ref 0.1–0.9)
NEUT%: 31.1 % — ABNORMAL LOW (ref 39.0–75.0)
RDW: 13.5 % (ref 11.0–14.6)
WBC: 2.9 10*3/uL — ABNORMAL LOW (ref 4.0–10.3)
lymph#: 1.1 10*3/uL (ref 0.9–3.3)

## 2012-05-12 ENCOUNTER — Other Ambulatory Visit (HOSPITAL_BASED_OUTPATIENT_CLINIC_OR_DEPARTMENT_OTHER): Payer: Medicare Other

## 2012-05-12 ENCOUNTER — Ambulatory Visit: Payer: Medicare Other

## 2012-05-12 ENCOUNTER — Telehealth: Payer: Self-pay | Admitting: *Deleted

## 2012-05-12 DIAGNOSIS — D693 Immune thrombocytopenic purpura: Secondary | ICD-10-CM

## 2012-05-12 LAB — CBC WITH DIFFERENTIAL/PLATELET
Basophils Absolute: 0 10*3/uL (ref 0.0–0.1)
Eosinophils Absolute: 0 10*3/uL (ref 0.0–0.5)
HGB: 14.2 g/dL (ref 13.0–17.1)
MONO#: 0.8 10*3/uL (ref 0.1–0.9)
NEUT#: 0.9 10*3/uL — ABNORMAL LOW (ref 1.5–6.5)
RBC: 4.74 10*6/uL (ref 4.20–5.82)
RDW: 13.7 % (ref 11.0–14.6)
WBC: 2.8 10*3/uL — ABNORMAL LOW (ref 4.0–10.3)
lymph#: 1 10*3/uL (ref 0.9–3.3)
nRBC: 0 % (ref 0–0)

## 2012-05-12 NOTE — Telephone Encounter (Signed)
Pt notified of lab results per Dr Cyndie Chime.  He asked how much longer does he need to cont.weekly lab checks.  Note to Dr Cyndie Chime.

## 2012-05-12 NOTE — Telephone Encounter (Signed)
Message copied by Sabino Snipes on Fri May 12, 2012  6:44 PM ------      Message from: Levert Feinstein      Created: Fri May 12, 2012  1:31 PM       Call pt platelets 70,000

## 2012-05-18 ENCOUNTER — Telehealth: Payer: Self-pay

## 2012-05-18 ENCOUNTER — Other Ambulatory Visit: Payer: Self-pay

## 2012-05-18 NOTE — Telephone Encounter (Signed)
Pt notified by phone per Dr Cyndie Chime - pt can go to monthly labs rather than weekly.    appts for 5/31 6/7 & 6/14 cancelled.  Pt aware of next appt on 6/21.  dph

## 2012-05-19 ENCOUNTER — Ambulatory Visit: Payer: Medicare Other

## 2012-05-19 ENCOUNTER — Other Ambulatory Visit: Payer: Medicare Other | Admitting: Lab

## 2012-05-26 ENCOUNTER — Ambulatory Visit: Payer: Medicare Other

## 2012-05-26 ENCOUNTER — Other Ambulatory Visit: Payer: Medicare Other

## 2012-06-02 ENCOUNTER — Ambulatory Visit: Payer: Medicare Other

## 2012-06-02 ENCOUNTER — Other Ambulatory Visit: Payer: Medicare Other | Admitting: Lab

## 2012-06-09 ENCOUNTER — Ambulatory Visit: Payer: Medicare Other

## 2012-06-09 ENCOUNTER — Other Ambulatory Visit (HOSPITAL_BASED_OUTPATIENT_CLINIC_OR_DEPARTMENT_OTHER): Payer: Medicare Other | Admitting: Lab

## 2012-06-09 DIAGNOSIS — D693 Immune thrombocytopenic purpura: Secondary | ICD-10-CM

## 2012-06-09 LAB — CBC WITH DIFFERENTIAL/PLATELET
BASO%: 0.4 % (ref 0.0–2.0)
Basophils Absolute: 0 10*3/uL (ref 0.0–0.1)
Eosinophils Absolute: 0 10*3/uL (ref 0.0–0.5)
HCT: 42.2 % (ref 38.4–49.9)
HGB: 14.4 g/dL (ref 13.0–17.1)
MCHC: 34.1 g/dL (ref 32.0–36.0)
MONO#: 0.7 10*3/uL (ref 0.1–0.9)
NEUT#: 0.6 10*3/uL — ABNORMAL LOW (ref 1.5–6.5)
NEUT%: 26.4 % — ABNORMAL LOW (ref 39.0–75.0)
Platelets: 90 10*3/uL — ABNORMAL LOW (ref 140–400)
WBC: 2.3 10*3/uL — ABNORMAL LOW (ref 4.0–10.3)
lymph#: 1 10*3/uL (ref 0.9–3.3)

## 2012-06-23 ENCOUNTER — Ambulatory Visit: Payer: Medicare Other

## 2012-06-23 ENCOUNTER — Other Ambulatory Visit (HOSPITAL_BASED_OUTPATIENT_CLINIC_OR_DEPARTMENT_OTHER): Payer: Medicare Other

## 2012-06-23 DIAGNOSIS — Z5181 Encounter for therapeutic drug level monitoring: Secondary | ICD-10-CM

## 2012-06-23 DIAGNOSIS — D693 Immune thrombocytopenic purpura: Secondary | ICD-10-CM

## 2012-06-23 LAB — CBC WITH DIFFERENTIAL/PLATELET
Basophils Absolute: 0 10*3/uL (ref 0.0–0.1)
Eosinophils Absolute: 0 10*3/uL (ref 0.0–0.5)
HCT: 43.3 % (ref 38.4–49.9)
HGB: 14.7 g/dL (ref 13.0–17.1)
MCV: 87.5 fL (ref 79.3–98.0)
MONO%: 31.9 % — ABNORMAL HIGH (ref 0.0–14.0)
NEUT#: 1.1 10*3/uL — ABNORMAL LOW (ref 1.5–6.5)
NEUT%: 36.9 % — ABNORMAL LOW (ref 39.0–75.0)
RDW: 12.5 % (ref 11.0–14.6)

## 2012-06-23 LAB — COMPREHENSIVE METABOLIC PANEL
BUN: 9 mg/dL (ref 6–23)
CO2: 30 mEq/L (ref 19–32)
Calcium: 9.1 mg/dL (ref 8.4–10.5)
Chloride: 105 mEq/L (ref 96–112)
Creatinine, Ser: 1.02 mg/dL (ref 0.50–1.35)

## 2012-06-23 LAB — LACTATE DEHYDROGENASE: LDH: 157 U/L (ref 94–250)

## 2012-06-29 ENCOUNTER — Telehealth: Payer: Self-pay | Admitting: *Deleted

## 2012-06-29 NOTE — Telephone Encounter (Signed)
Pt called reporting that he saw his GP, Dr.Reade today due to an abscess on his chest wall & he prescribed cefelexin 500 mg & he just wants to make sure there aren't any interactions with promacta.  Checked with pharmacist, Melissa & reported to pt " no problem".

## 2012-06-30 ENCOUNTER — Telehealth: Payer: Self-pay | Admitting: Oncology

## 2012-06-30 ENCOUNTER — Ambulatory Visit (HOSPITAL_BASED_OUTPATIENT_CLINIC_OR_DEPARTMENT_OTHER): Payer: Medicare Other | Admitting: Nurse Practitioner

## 2012-06-30 VITALS — BP 143/81 | HR 56 | Temp 97.3°F | Ht 71.5 in | Wt 207.9 lb

## 2012-06-30 DIAGNOSIS — D693 Immune thrombocytopenic purpura: Secondary | ICD-10-CM

## 2012-06-30 NOTE — Telephone Encounter (Signed)
appts made and printed for pt  °

## 2012-06-30 NOTE — Progress Notes (Signed)
OFFICE PROGRESS NOTE  Interval history:  Billy [name] is a 71 year old man with chronic ITP. He was transitioned from Nplate to [place] in March of this year. He is seen today for scheduled followup.  Billy [name] reports that overall he is doing well. He denies bruising/bleeding. No skin rash. No diarrhea. He began a course of cephalexin 06/29/2012 for an infection at the left chest wall. He denies fever.   Objective: Blood pressure 143/81, pulse 56, temperature 97.3 F (36.3 C), temperature source Oral, height 5' 11.5" (1.816 m), weight 207 lb 14.4 oz (94.303 kg).  Oropharynx is without thrush or ulceration. No palpable cervical, supraclavicular or axillary lymph nodes. Lungs are clear. No wheezes or rales. Regular cardiac rhythm. No murmur. Abdomen is soft and nontender. No organomegaly. Extremities are without edema. Calves are soft and nontender. Erythema, induration and tenderness at the inferior left breast.  Lab Results: Lab Results  Component Value Date   WBC 2.9* 06/23/2012   HGB 14.7 06/23/2012   HCT 43.3 06/23/2012   MCV 87.5 06/23/2012   PLT 65* 06/23/2012    Chemistry:    Chemistry      Component Value Date/Time   NA 143 06/23/2012 1230   K 4.2 06/23/2012 1230   CL 105 06/23/2012 1230   CO2 30 06/23/2012 1230   BUN 9 06/23/2012 1230   CREATININE 1.02 06/23/2012 1230      Component Value Date/Time   CALCIUM 9.1 06/23/2012 1230   ALKPHOS 45 06/23/2012 1230   AST 20 06/23/2012 1230   ALT 19 06/23/2012 1230   BILITOT 0.9 06/23/2012 1230       Studies/Results: No results found.  Medications: I have reviewed the patient's current medications.  Assessment/Plan:  1. Chronic ITP maintained on Promacta since March of this year with the platelet count maintaining at greater than 50,000 over the past 8-10 weeks. 2. Left chest skin infection. He is currently completing a course of cephalexin. 3. Essential hypertension.  Disposition-Billy [name] will continue Promacta. Blood counts are being checked  on a monthly basis. He will return for a followup visit in 4 months. He will contact the office in the interim with any problems.  Lonna Cobb ANP/GNP-BC

## 2012-07-17 ENCOUNTER — Telehealth: Payer: Self-pay | Admitting: *Deleted

## 2012-07-17 ENCOUNTER — Other Ambulatory Visit: Payer: Self-pay | Admitting: *Deleted

## 2012-07-17 NOTE — Telephone Encounter (Signed)
Received call from wife informing nurse that pt has appt to see surgeon Dr. Michaell Cowing on  07/18/12 at 215 pm for evaluation of chest wall abscess.   Message to md. Wife  Cell phone    [phone].

## 2012-07-18 ENCOUNTER — Encounter (INDEPENDENT_AMBULATORY_CARE_PROVIDER_SITE_OTHER): Payer: Self-pay | Admitting: Surgery

## 2012-07-18 ENCOUNTER — Ambulatory Visit (INDEPENDENT_AMBULATORY_CARE_PROVIDER_SITE_OTHER): Payer: Medicare Other | Admitting: Surgery

## 2012-07-18 ENCOUNTER — Telehealth: Payer: Self-pay | Admitting: *Deleted

## 2012-07-18 VITALS — BP 142/88 | HR 64 | Temp 97.5°F | Resp 16 | Ht 71.0 in | Wt 209.8 lb

## 2012-07-18 DIAGNOSIS — R222 Localized swelling, mass and lump, trunk: Secondary | ICD-10-CM | POA: Insufficient documentation

## 2012-07-18 NOTE — Telephone Encounter (Signed)
Pt notified that Dr.Granfortuna was OK with plan.  Pt thinks that Dr. Michaell Cowing will check plt count tomorrow [facility].

## 2012-07-18 NOTE — Patient Instructions (Signed)
Consider surgery.  Please call our office at [phone] if you wish to schedule surgery or if you have further questions / concerns.   Abscess An abscess (boil or furuncle) is an infected area that contains a collection of pus.  SYMPTOMS Signs and symptoms of an abscess include pain, tenderness, redness, or hardness. You may feel a moveable soft area under your skin. An abscess can occur anywhere in the body.  TREATMENT  A surgical cut (incision) may be made over your abscess to drain the pus. Gauze may be packed into the space or a drain may be looped through the abscess cavity (pocket). This provides a drain that will allow the cavity to heal from the inside outwards. The abscess may be painful for a few days, but should feel much better if it was drained.  Your abscess, if seen early, may not have localized and may not have been drained. If not, another appointment may be required if it does not get better on its own or with medications. HOME CARE INSTRUCTIONS   Only take over-the-counter or prescription medicines for pain, discomfort, or fever as directed by your caregiver.   Take your antibiotics as directed if they were prescribed. Finish them even if you start to feel better.   Keep the skin and clothes clean around your abscess.   If the abscess was drained, you will need to use gauze dressing to collect any draining pus. Dressings will typically need to be changed 3 or more times a day.   The infection may spread by skin contact with others. Avoid skin contact as much as possible.   Practice good hygiene. This includes regular hand washing, cover any draining skin lesions, and do not share personal care items.   If you participate in sports, do not share athletic equipment, towels, whirlpools, or personal care items. Shower after every practice or tournament.   If a draining area cannot be adequately covered:   Do not participate in sports.   Children should not participate  in day care until the wound has healed or drainage stops.   If your caregiver has given you a follow-up appointment, it is very important to keep that appointment. Not keeping the appointment could result in a much worse infection, chronic or permanent injury, pain, and disability. If there is any problem keeping the appointment, you must call back to this facility for assistance.  SEEK MEDICAL CARE IF:   You develop increased pain, swelling, redness, drainage, or bleeding in the wound site.   You develop signs of generalized infection including muscle aches, chills, fever, or a general ill feeling.   You have an oral temperature above 102 F (38.9 C).  MAKE SURE YOU:   Understand these instructions.   Will watch your condition.   Will get help right away if you are not doing well or get worse.  Document Released: 09/15/2005 Document Revised: 11/25/2011 Document Reviewed: 07/09/2008 [facility] Patient Information 2012 [place], [place].

## 2012-07-18 NOTE — Telephone Encounter (Signed)
Received call from pt's wife, Elita Quick stating that pt is being scheduled for surgery tomorrow with Dr. Michaell Cowing for a chest wall abscess to remove scar tissue.  She states that he will do surgery if platelets > 50 K.  Note to Dr Cyndie Chime.

## 2012-07-18 NOTE — Progress Notes (Signed)
Subjective:     Patient ID: Billy [name], male   DOB: 1941/10/11, 71 y.o.   MRN: [id number]  HPI  Billy [name]  [birth year] [id number]  Patient Care Team: Sissy Hoff, MD as PCP - General (Family Medicine) Levert Feinstein, MD as Consulting Physician (Hematology and Oncology)  This patient is a 71 y.o.male who presents today for surgical evaluation at the request of Dr. Azucena Cecil.   Reason for visit: Draining mass on left chest wall.  Question of abscess.  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  On chronic immunosuppression with his platelet counts running 60-80 followed by Dr. Cyndie Chime.  He had a chest wall mass that was excised by Dr. Lindie Spruce 2005.  Initially felt to be a cyst but intraoperatively perhaps more.  The recalls being told it was benign.  Do not have those records available yet but we are working on it.  The patient notes he's had some "scar tissue" on the medial aspect of his chest wall ever since.  It usually does not bother him.  However about over a month ago it became a little more painful and swollen.  It was sensitive to the touch.  It opened up and started draining a few weeks ago.  He went to his primary care office.  They started him on cephalexin.  It persisted.  They switched him over to doxycycline.  It persisted.  Therefore he was sent to [place] for urgent evaluation.  The patient does not recall any history of other infections or MRSA.  No fevers chills or sweats.  Weight has been stable.  Patient Active Problem List  Diagnosis  . Secondary thrombocytopenia  . Benign essential HTN  . Chronic ITP (idiopathic thrombocytopenia)  . Mass of chest wall, recurrent & draining    Past Medical History  Diagnosis Date  . Chronic ITP (idiopathic thrombocytopenia) 02/27/2012  . Clotting disorder   . Hypertension   . Abscess     on chest    Past Surgical History  Procedure Date  . Appendectomy 1955  . Cystectomy 2000    left chest wall  . Deep excision  of left anterior chest wall mass. 2005    ? infected seb cyst    History   Social History  . Marital Status: Married    Spouse Name: N/A    Number of Children: N/A  . Years of Education: N/A   Occupational History  . Chiropractor     semi-retired 2013   Social History Main Topics  . Smoking status: Never Smoker   . Smokeless tobacco: Not on file  . Alcohol Use: Yes     occ  . Drug Use: No  . Sexually Active:    Other Topics Concern  . Not on file   Social History Narrative  . No narrative on file    Family History  Problem Relation Age of Onset  . Heart disease Father   . Cancer Brother     bone and liver    Current Outpatient Prescriptions  Medication Sig Dispense Refill  . amLODipine (NORVASC) 5 MG tablet       . doxycycline (ADOXA) 150 MG tablet Take 150 mg by mouth 2 (two) times daily.      Marland Kitchen eltrombopag (PROMACTA) 50 MG tablet Take 1 tablet (50 mg total) by mouth daily. Take on an empty stomach 1 hour before a meal or 2 hours after  30 tablet  3  . metoprolol  succinate (TOPROL-XL) 100 MG 24 hr tablet       . Multiple Vitamin (MULTIVITAMIN) tablet Take 1 tablet by mouth daily.         Allergies  Allergen Reactions  . Penicillins Other (See Comments)    Doesn't remember was infant    BP 142/88  Pulse 64  Temp 97.5 F (36.4 C) (Temporal)  Resp 16  Ht 5\' 11"  (1.803 m)  Wt 209 lb 12.8 oz (95.165 kg)  BMI 29.26 kg/m2  No results found.   Review of Systems  Constitutional: Negative for fever, chills and diaphoresis.  HENT: Negative for sore throat, trouble swallowing and neck pain.   Eyes: Negative for photophobia and visual disturbance.  Respiratory: Negative for choking and shortness of breath.   Cardiovascular: Negative for chest pain and palpitations.  Gastrointestinal: Negative for nausea, vomiting, abdominal distention, anal bleeding and rectal pain.  Genitourinary: Negative for dysuria, urgency, difficulty urinating and testicular pain.    Musculoskeletal: Negative for myalgias, arthralgias and gait problem.  Skin: Positive for wound. Negative for color change and rash.  Neurological: Negative for dizziness, speech difficulty, weakness and numbness.  Hematological: Negative for adenopathy. Bruises/bleeds easily.  Psychiatric/Behavioral: Negative for hallucinations, confusion and agitation.       Objective:   Physical Exam  Constitutional: He is oriented to person, place, and time. He appears well-developed and well-nourished. No distress.  HENT:  Head: Normocephalic.  Mouth/Throat: Oropharynx is clear and moist. No oropharyngeal exudate.  Eyes: Conjunctivae and EOM are normal. Pupils are equal, round, and reactive to light. No scleral icterus.  Neck: Normal range of motion. No tracheal deviation present.  Cardiovascular: Normal rate, normal heart sounds and intact distal pulses.   Pulmonary/Chest: Effort normal. No respiratory distress.    Abdominal: Soft. He exhibits no distension. There is no tenderness. Hernia confirmed negative in the right inguinal area and confirmed negative in the left inguinal area.       No hernias  Musculoskeletal: Normal range of motion. He exhibits no tenderness.  Neurological: He is alert and oriented to person, place, and time. No cranial nerve deficit. He exhibits normal muscle tone. Coordination normal.  Skin: Skin is warm and dry. No rash noted. He is not diaphoretic.  Psychiatric: He has a normal mood and affect. His behavior is normal.       Assessment:     Recurrent chest wall mass in immunosuppressed pt with thrombocytopenia due to ITP    Plan:     Followup on his cultures as well  I hope this is just a benign recurrence since the excision 2005 was benign according to the patient.  I tried to get pathology records.  I am skeptical that incision and drainage is going to treat this since that mass is been there since the last removal.  I think he needs to be removed in its  entirety.  Because it is fixed probably to the pectoralis and he he has thrombocytopenia, I think he should be done in the operating room under more controlled setting.  His platelet count is above 50 so I think it is not require any platelet transfusions.  Hopefully I can at least partially close it and avoid the open wounds and sees immunosuppressed.  I discussed the procedure with him:  The pathophysiology of skin & subcutaneous masses was discussed.  Natural history risks without surgery were discussed.  I recommended surgery to remove the mass.  I explained the technique of removal with use  of local anesthesia with more aggressive sedation/anesthesia for patient comfort.    Risks such as bleeding, infection, heart attack, death, and other risks were discussed.  I noted a good likelihood this will help address the problem.   Possibility that this will not correct all symptoms was explained. Possibility of regrowth/recurrence of the mass was discussed.  We will work to minimize complications. Questions were answered.  The patient expresses understanding & wishes to proceed with surgery.  He has just ate.  We will try and do this tomorrow since it's been going on for several weeks.

## 2012-07-19 ENCOUNTER — Other Ambulatory Visit (INDEPENDENT_AMBULATORY_CARE_PROVIDER_SITE_OTHER): Payer: Self-pay | Admitting: Surgery

## 2012-07-19 DIAGNOSIS — L723 Sebaceous cyst: Secondary | ICD-10-CM

## 2012-07-19 DIAGNOSIS — R222 Localized swelling, mass and lump, trunk: Secondary | ICD-10-CM

## 2012-07-19 MED ORDER — DOXYCYCLINE HYCLATE 100 MG PO TABS: 100.0000 mg | ORAL_TABLET | Freq: Two times a day (BID) | ORAL | Status: AC

## 2012-07-20 ENCOUNTER — Other Ambulatory Visit (HOSPITAL_BASED_OUTPATIENT_CLINIC_OR_DEPARTMENT_OTHER): Payer: Medicare Other

## 2012-07-20 DIAGNOSIS — D693 Immune thrombocytopenic purpura: Secondary | ICD-10-CM

## 2012-07-20 LAB — CBC WITH DIFFERENTIAL/PLATELET
BASO%: 0.4 % (ref 0.0–2.0)
EOS%: 0.4 % (ref 0.0–7.0)
LYMPH%: 39.1 % (ref 14.0–49.0)
MCH: 29.4 pg (ref 27.2–33.4)
MCHC: 33.6 g/dL (ref 32.0–36.0)
MCV: 87.6 fL (ref 79.3–98.0)
MONO#: 0.6 10*3/uL (ref 0.1–0.9)
MONO%: 19.4 % — ABNORMAL HIGH (ref 0.0–14.0)
Platelets: 65 10*3/uL — ABNORMAL LOW (ref 140–400)
RBC: 4.83 10*6/uL (ref 4.20–5.82)
WBC: 2.8 10*3/uL — ABNORMAL LOW (ref 4.0–10.3)
nRBC: 0 % (ref 0–0)

## 2012-07-21 ENCOUNTER — Telehealth (INDEPENDENT_AMBULATORY_CARE_PROVIDER_SITE_OTHER): Payer: Self-pay | Admitting: General Surgery

## 2012-07-21 NOTE — Telephone Encounter (Signed)
Pt calling to ask about coming in for a drain removal.   His surgery was done at [facility], so no post op instructions to see if Dr. Michaell Cowing wanted to do it himself, or if he wanted a nurse-only to do it.  His post op appt is later in August.  Please advise.

## 2012-07-26 ENCOUNTER — Telehealth (INDEPENDENT_AMBULATORY_CARE_PROVIDER_SITE_OTHER): Payer: Self-pay | Admitting: General Surgery

## 2012-07-26 ENCOUNTER — Ambulatory Visit (INDEPENDENT_AMBULATORY_CARE_PROVIDER_SITE_OTHER): Payer: Medicare Other

## 2012-07-26 DIAGNOSIS — R222 Localized swelling, mass and lump, trunk: Secondary | ICD-10-CM

## 2012-07-26 NOTE — Telephone Encounter (Signed)
openned in error

## 2012-07-26 NOTE — Progress Notes (Signed)
Pt came in for nurse only to have drain removed from sx done 7/31 for exc. Of chest wall mass. The pt notified me that the drain came out yesterday when he was removing his shirt. The suture was still in place so I was going to remove the suture. I could not get the suture knot exposed so I asked Dr Andrey Campanile to come remove the suture. Dr Andrey Campanile removed the suture and I placed a dry gauze over the area. The pt tolerated this fine. I did explain to the pt what he needs to look for signs of fluid building up at the incision and pt knows to call if any changes. The pt has f/u appt with Dr Michaell Cowing. The pt was notified of his path report being benign per Dr Michaell Cowing.

## 2012-08-08 ENCOUNTER — Ambulatory Visit (INDEPENDENT_AMBULATORY_CARE_PROVIDER_SITE_OTHER): Payer: Medicare Other | Admitting: Surgery

## 2012-08-08 ENCOUNTER — Encounter (INDEPENDENT_AMBULATORY_CARE_PROVIDER_SITE_OTHER): Payer: Self-pay | Admitting: Surgery

## 2012-08-08 VITALS — BP 148/78 | HR 60 | Temp 97.3°F | Resp 12 | Ht 71.0 in | Wt 208.6 lb

## 2012-08-08 DIAGNOSIS — R222 Localized swelling, mass and lump, trunk: Secondary | ICD-10-CM

## 2012-08-08 NOTE — Progress Notes (Signed)
Subjective:     Patient ID: Billy [name], male   DOB: 01-Nov-1941, 71 y.o.   MRN: [id number]  HPI  Billy [name]  [birth year] [id number]  Patient Care Team: Sissy Hoff, MD as PCP - General (Family Medicine) Levert Feinstein, MD as Consulting Physician (Hematology and Oncology)  This patient is a 71 y.o.male who presents today for surgical evaluation  Procedure: Excision of recurrent chest wall mass eft side.  Pathology: Diagnosis Soft tissue mass, simple excision, left chest wall - FEATURES SUGGESTIVE OF EPIDERMOID INCLUSION CYST WITH RUPTURE. - THERE IS NO EVIDENCE OF MALIGNANCY.  The patient comes in today feeling well.  His postoperative subcutaneous drain had fallen out on its own.  No swelling or drainage since.  Had moderate soreness on his rib cage and pectoralis minor left side but that is fading away.  Wanting to be more aggressive in his exercise.  No fevers or chills.  No drainage  Patient Active Problem List  Diagnosis  . Benign essential HTN  . Chronic ITP (idiopathic thrombocytopenia)    Past Medical History  Diagnosis Date  . Chronic ITP (idiopathic thrombocytopenia) 02/27/2012  . Clotting disorder   . Hypertension   . Abscess     on chest    Past Surgical History  Procedure Date  . Appendectomy 1955  . Cystectomy 2000    left chest wall  . Deep excision of left anterior chest wall mass. 2005    ? infected seb cyst    History   Social History  . Marital Status: Married    Spouse Name: N/A    Number of Children: N/A  . Years of Education: N/A   Occupational History  . Chiropractor     semi-retired 2013   Social History Main Topics  . Smoking status: Never Smoker   . Smokeless tobacco: Not on file  . Alcohol Use: Yes     occ  . Drug Use: No  . Sexually Active:    Other Topics Concern  . Not on file   Social History Narrative  . No narrative on file    Family History  Problem Relation Age of Onset  . Heart disease Father     . Cancer Brother     bone and liver    Current Outpatient Prescriptions  Medication Sig Dispense Refill  . amLODipine (NORVASC) 5 MG tablet       . doxycycline (ADOXA) 150 MG tablet Take 150 mg by mouth 2 (two) times daily.      Marland Kitchen eltrombopag (PROMACTA) 50 MG tablet Take 1 tablet (50 mg total) by mouth daily. Take on an empty stomach 1 hour before a meal or 2 hours after  30 tablet  3  . metoprolol succinate (TOPROL-XL) 100 MG 24 hr tablet       . Multiple Vitamin (MULTIVITAMIN) tablet Take 1 tablet by mouth daily.         Allergies  Allergen Reactions  . Penicillins Other (See Comments)    Doesn't remember was infant    BP 148/78  Pulse 60  Temp 97.3 F (36.3 C) (Temporal)  Resp 12  Ht 5\' 11"  (1.803 m)  Wt 208 lb 9.6 oz (94.62 kg)  BMI 29.09 kg/m2  No results found.   Review of Systems  Constitutional: Negative for fever, chills and diaphoresis.  HENT: Negative for sore throat, trouble swallowing and neck pain.   Eyes: Negative for photophobia and visual disturbance.  Respiratory: Negative for  choking and shortness of breath.   Cardiovascular: Negative for chest pain and palpitations.  Gastrointestinal: Negative for nausea, vomiting, abdominal distention, anal bleeding and rectal pain.  Genitourinary: Negative for dysuria, urgency, difficulty urinating and testicular pain.  Musculoskeletal: Negative for myalgias, arthralgias and gait problem.  Skin: Negative for color change and rash.  Neurological: Negative for dizziness, speech difficulty, weakness and numbness.  Hematological: Negative for adenopathy.  Psychiatric/Behavioral: Negative for hallucinations, confusion and agitation.       Objective:   Physical Exam  Constitutional: He is oriented to person, place, and time. He appears well-developed and well-nourished. No distress.  HENT:  Head: Normocephalic.  Mouth/Throat: Oropharynx is clear and moist. No oropharyngeal exudate.  Eyes: Conjunctivae and EOM are  normal. Pupils are equal, round, and reactive to light. No scleral icterus.  Neck: Normal range of motion. No tracheal deviation present.  Cardiovascular: Normal rate, normal heart sounds and intact distal pulses.   Pulmonary/Chest: Effort normal. No respiratory distress.    Abdominal: Soft. He exhibits no distension. There is no tenderness. Hernia confirmed negative in the right inguinal area and confirmed negative in the left inguinal area.  Musculoskeletal: Normal range of motion. He exhibits no tenderness.  Neurological: He is alert and oriented to person, place, and time. No cranial nerve deficit. He exhibits normal muscle tone. Coordination normal.  Skin: Skin is warm and dry. No rash noted. He is not diaphoretic.  Psychiatric: He has a normal mood and affect. His behavior is normal.       Assessment:     Recovering well after removal of recurrent chest wall mass.  Pathology favors cyst.  No cancer or tumor    Plan:     Increase activity as tolerated.  Do not push through pain.  Return to clinic p.r.n. The patient expressed understanding and appreciation

## 2012-08-17 ENCOUNTER — Other Ambulatory Visit (HOSPITAL_BASED_OUTPATIENT_CLINIC_OR_DEPARTMENT_OTHER): Payer: Medicare Other | Admitting: Lab

## 2012-08-17 DIAGNOSIS — D693 Immune thrombocytopenic purpura: Secondary | ICD-10-CM

## 2012-08-17 LAB — COMPREHENSIVE METABOLIC PANEL (CC13)
ALT: 26 U/L (ref 0–55)
AST: 21 U/L (ref 5–34)
Albumin: 3.6 g/dL (ref 3.5–5.0)
Alkaline Phosphatase: 48 U/L (ref 40–150)
BUN: 12 mg/dL (ref 7.0–26.0)
Potassium: 3.8 mEq/L (ref 3.5–5.1)
Sodium: 140 mEq/L (ref 136–145)
Total Bilirubin: 0.5 mg/dL (ref 0.20–1.20)

## 2012-08-17 LAB — CBC WITH DIFFERENTIAL/PLATELET
BASO%: 0.5 % (ref 0.0–2.0)
EOS%: 0.5 % (ref 0.0–7.0)
MCH: 29.7 pg (ref 27.2–33.4)
MCHC: 34.4 g/dL (ref 32.0–36.0)
MCV: 86.5 fL (ref 79.3–98.0)
MONO%: 25.8 % — ABNORMAL HIGH (ref 0.0–14.0)
RDW: 12.2 % (ref 11.0–14.6)
lymph#: 0.9 10*3/uL (ref 0.9–3.3)

## 2012-08-23 ENCOUNTER — Other Ambulatory Visit: Payer: Self-pay | Admitting: Oncology

## 2012-09-01 ENCOUNTER — Other Ambulatory Visit: Payer: Self-pay | Admitting: Oncology

## 2012-09-14 ENCOUNTER — Other Ambulatory Visit (HOSPITAL_BASED_OUTPATIENT_CLINIC_OR_DEPARTMENT_OTHER): Payer: Medicare Other

## 2012-09-14 DIAGNOSIS — D693 Immune thrombocytopenic purpura: Secondary | ICD-10-CM

## 2012-09-14 LAB — CBC WITH DIFFERENTIAL/PLATELET
BASO%: 0.4 % (ref 0.0–2.0)
Basophils Absolute: 0 10*3/uL (ref 0.0–0.1)
EOS%: 0.4 % (ref 0.0–7.0)
HGB: 14.4 g/dL (ref 13.0–17.1)
MCH: 29.9 pg (ref 27.2–33.4)
MONO#: 0.7 10*3/uL (ref 0.1–0.9)
NEUT#: 0.6 10*3/uL — ABNORMAL LOW (ref 1.5–6.5)
RDW: 12.3 % (ref 11.0–14.6)
WBC: 2.3 10*3/uL — ABNORMAL LOW (ref 4.0–10.3)
lymph#: 0.9 10*3/uL (ref 0.9–3.3)

## 2012-09-29 ENCOUNTER — Encounter (INDEPENDENT_AMBULATORY_CARE_PROVIDER_SITE_OTHER): Payer: Self-pay

## 2012-10-09 ENCOUNTER — Other Ambulatory Visit (HOSPITAL_BASED_OUTPATIENT_CLINIC_OR_DEPARTMENT_OTHER): Payer: Medicare Other | Admitting: Lab

## 2012-10-09 DIAGNOSIS — D693 Immune thrombocytopenic purpura: Secondary | ICD-10-CM

## 2012-10-09 LAB — CBC WITH DIFFERENTIAL/PLATELET
Eosinophils Absolute: 0 10*3/uL (ref 0.0–0.5)
MONO#: 0.8 10*3/uL (ref 0.1–0.9)
NEUT#: 1 10*3/uL — ABNORMAL LOW (ref 1.5–6.5)
Platelets: 53 10*3/uL — ABNORMAL LOW (ref 140–400)
RBC: 4.78 10*6/uL (ref 4.20–5.82)
RDW: 12.5 % (ref 11.0–14.6)
WBC: 2.7 10*3/uL — ABNORMAL LOW (ref 4.0–10.3)

## 2012-10-12 ENCOUNTER — Other Ambulatory Visit: Payer: Medicare Other | Admitting: Lab

## 2012-10-17 ENCOUNTER — Ambulatory Visit (HOSPITAL_BASED_OUTPATIENT_CLINIC_OR_DEPARTMENT_OTHER): Payer: Medicare Other | Admitting: Lab

## 2012-10-17 ENCOUNTER — Encounter: Payer: Self-pay | Admitting: Oncology

## 2012-10-17 ENCOUNTER — Ambulatory Visit (HOSPITAL_BASED_OUTPATIENT_CLINIC_OR_DEPARTMENT_OTHER): Payer: Medicare Other | Admitting: Oncology

## 2012-10-17 VITALS — BP 160/72 | HR 61 | Temp 98.3°F | Resp 20 | Ht 71.0 in | Wt 208.8 lb

## 2012-10-17 DIAGNOSIS — D693 Immune thrombocytopenic purpura: Secondary | ICD-10-CM

## 2012-10-17 DIAGNOSIS — D72819 Decreased white blood cell count, unspecified: Secondary | ICD-10-CM

## 2012-10-17 DIAGNOSIS — D72821 Monocytosis (symptomatic): Secondary | ICD-10-CM | POA: Insufficient documentation

## 2012-10-17 HISTORY — DX: Decreased white blood cell count, unspecified: D72.819

## 2012-10-17 HISTORY — DX: Monocytosis (symptomatic): D72.821

## 2012-10-17 LAB — COMPREHENSIVE METABOLIC PANEL (CC13)
Albumin: 3.8 g/dL (ref 3.5–5.0)
CO2: 30 mEq/L — ABNORMAL HIGH (ref 22–29)
Glucose: 90 mg/dl (ref 70–99)
Potassium: 4.2 mEq/L (ref 3.5–5.1)
Sodium: 141 mEq/L (ref 136–145)
Total Protein: 6.7 g/dL (ref 6.4–8.3)

## 2012-10-17 LAB — CBC WITH DIFFERENTIAL/PLATELET
Eosinophils Absolute: 0 10*3/uL (ref 0.0–0.5)
MONO#: 0.8 10*3/uL (ref 0.1–0.9)
MONO%: 27.3 % — ABNORMAL HIGH (ref 0.0–14.0)
NEUT#: 1 10*3/uL — ABNORMAL LOW (ref 1.5–6.5)
RBC: 4.73 10*6/uL (ref 4.20–5.82)
RDW: 12.7 % (ref 11.0–14.6)
WBC: 2.8 10*3/uL — ABNORMAL LOW (ref 4.0–10.3)

## 2012-10-17 NOTE — Patient Instructions (Signed)
Change blood tests to every 2 months for CBCs and every 4 months for liver chemistry

## 2012-10-17 NOTE — Progress Notes (Signed)
Hematology and Oncology Follow Up Visit  Billy [name] [id number] [birth year] 71 y.o. 10/17/2012 6:16 PM   Principle Diagnosis: Encounter Diagnosis  Name Primary?  . 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 He had  mild thrombocytopenia documented as early as March 2007 when platelet count was 123,000. A CBC done in February 2009 showed platelets down to 42,000.  He was treated with observation alone until November 2009 when platelet count drifted down to 25,000. He had a minimal response to steroids and no improvement with addition of danazol added in January 2010. Due to the development of some atypical features with leukopenia and monocytosis I did a bone marrow aspiration and biopsy on 06/12/2009 to exclude additional pathology. Bone marrow and cytogenetic studies on the marrow were normal. No excess blasts. No excess plasma cells.  He was given a trial of Rituxan antibody between 07/01/2009 and 07/22/2009. He had no response. He was started on weekly Nplate injections on 10/10/2009 which was successful in maintaining his platelet counts above 50,000 with peak counts as high as 144,000. He stayed on the drug for 2-1/2 years. I convinced him to begin a trial of the oral thrombopoietin receptor agonist, Promacta (eltrombopag) which was started in March 2013. He has tolerated this product well and once again we have been able to maintain his platelet count at or above 50,000. There were initial concerns with respect to potential liver toxicity of this drug, however, there have been no  reports of post marketing significant liver dysfunction when the product is used in people who do not have any previous underlying liver disease. We checked with the company again before we started him on the drug to confirm this. We have been monitoring his liver functions  periodically and they have remained normal.  He has had no interim medical problems. He did have an infection on the skin of his left chest wall back in July treated with oral antibiotics. This recurred at the end of July and he was seen by general surgery, Dr. Michaell Cowing. He had developed a soft tissue mass in the area of previous infection. This was excised under anesthesia on 07/19/2012. Pathology showed an epidermoid inclusion cyst with evidence of rupture but no evidence for malignancy. Medications: reviewed  Allergies:  Allergies  Allergen Reactions  . Penicillins Other (See Comments)    Doesn't remember was infant    Review of Systems: Constitutional:   No constitutional symptoms Respiratory: No cough or dyspnea Cardiovascular:  No chest pain or palpitations Gastrointestinal: No change in bowel habit Genito-Urinary: Not questioned Musculoskeletal: No muscle or bone pain Neurologic: No headache or change in vision Skin: Notable decrease in spontaneous bruising with response to platelet stimulating agents Remaining ROS negative.  Physical Exam: Blood pressure 160/72, pulse 61, temperature 98.3 F (36.8 C), temperature source Oral, resp. rate 20, height 5\' 11"  (1.803 m), weight 208 lb 12.8 oz (94.711 kg). Wt Readings from Last 3 Encounters:  10/17/12 208 lb 12.8 oz (94.711 kg)  08/08/12 208 lb 9.6 oz (94.62 kg)  07/18/12 209 lb 12.8 oz (95.165 kg)     General appearance: Well-nourished Caucasian man [name]: Pharynx no erythema or exudate Lymph nodes: No adenopathy Breasts: Lungs: Clear to auscultation resonant to percussion Heart: Regular rhythm no murmur Abdomen: Soft nontender, no mass, no organomegaly Extremities: No edema, no calf tenderness Vascular: No cyanosis Neurologic:  PERRLA. I could not get a good look at his optic discs. Motor strength 5 over 5. Reflexes 1+ symmetric Skin: No rash or ecchymosis  Lab Results: Lab Results  Component Value Date   WBC 2.8*  10/17/2012   HGB 14.4 10/17/2012   HCT 42.7 10/17/2012   MCV 90.2 10/17/2012   PLT 61* 10/17/2012     Chemistry      Component Value Date/Time   NA 141 10/17/2012 1413   NA 143 06/23/2012 1230   K 4.2 10/17/2012 1413   K 4.2 06/23/2012 1230   CL 107 10/17/2012 1413   CL 105 06/23/2012 1230   CO2 30* 10/17/2012 1413   CO2 30 06/23/2012 1230   BUN 10.0 10/17/2012 1413   BUN 9 06/23/2012 1230   CREATININE 0.8 10/17/2012 1413   CREATININE 1.02 06/23/2012 1230      Component Value Date/Time   CALCIUM 9.5 10/17/2012 1413   CALCIUM 9.1 06/23/2012 1230   ALKPHOS 48 10/17/2012 1413   ALKPHOS 45 06/23/2012 1230   AST 24 10/17/2012 1413   AST 20 06/23/2012 1230   ALT 28 10/17/2012 1413   ALT 19 06/23/2012 1230   BILITOT 0.47 10/17/2012 1413   BILITOT 0.9 06/23/2012 1230       Impression and Plan: #1. Chronic ITP. Platelet count in an acceptable range on oral Promacta Plan: Continue the same I'm going to change CBC frequency to every 2 months and chemistry profile to every 4 months.  #2. Leukopenia with monocytosis. Nondiagnostic bone marrow biopsy in the past. I assume that this may be on an immune basis related to his ITP.  #3. Essential hypertension  #4. Status post excision of an infected benign dermoid cyst   CC:. Dr. Elias Else   Billy Feinstein, MD 10/29/20136:16 PM

## 2012-12-05 ENCOUNTER — Encounter (INDEPENDENT_AMBULATORY_CARE_PROVIDER_SITE_OTHER): Payer: Self-pay

## 2012-12-19 ENCOUNTER — Other Ambulatory Visit (HOSPITAL_BASED_OUTPATIENT_CLINIC_OR_DEPARTMENT_OTHER): Payer: Medicare Other

## 2012-12-19 DIAGNOSIS — D693 Immune thrombocytopenic purpura: Secondary | ICD-10-CM

## 2012-12-19 LAB — CBC WITH DIFFERENTIAL/PLATELET
BASO%: 0.7 % (ref 0.0–2.0)
EOS%: 0.3 % (ref 0.0–7.0)
HCT: 46 % (ref 38.4–49.9)
HGB: 15.5 g/dL (ref 13.0–17.1)
MCHC: 33.7 g/dL (ref 32.0–36.0)
MONO#: 0.8 10*3/uL (ref 0.1–0.9)
NEUT%: 39.1 % (ref 39.0–75.0)
RDW: 12.2 % (ref 11.0–14.6)
WBC: 2.9 10*3/uL — ABNORMAL LOW (ref 4.0–10.3)
lymph#: 1 10*3/uL (ref 0.9–3.3)

## 2012-12-25 ENCOUNTER — Telehealth: Payer: Self-pay | Admitting: *Deleted

## 2012-12-25 NOTE — Telephone Encounter (Signed)
Pt notified of platelet results per Dr. Cyndie Chime.

## 2012-12-25 NOTE — Telephone Encounter (Signed)
Message copied by Sabino Snipes on Mon Dec 25, 2012 10:34 AM ------      Message from: Levert Feinstein      Created: Wed Dec 20, 2012 11:22 AM       Call pt platelets at his approximate baseline @ 53,000

## 2013-01-30 ENCOUNTER — Other Ambulatory Visit: Payer: Self-pay | Admitting: Oncology

## 2013-02-13 ENCOUNTER — Telehealth: Payer: Self-pay | Admitting: *Deleted

## 2013-02-13 ENCOUNTER — Other Ambulatory Visit (HOSPITAL_BASED_OUTPATIENT_CLINIC_OR_DEPARTMENT_OTHER): Payer: Medicare HMO

## 2013-02-13 DIAGNOSIS — D693 Immune thrombocytopenic purpura: Secondary | ICD-10-CM

## 2013-02-13 LAB — CBC WITH DIFFERENTIAL/PLATELET
BASO%: 0.5 % (ref 0.0–2.0)
Basophils Absolute: 0 10*3/uL (ref 0.0–0.1)
EOS%: 0.6 % (ref 0.0–7.0)
MCH: 29.7 pg (ref 27.2–33.4)
MCHC: 33.5 g/dL (ref 32.0–36.0)
MCV: 88.7 fL (ref 79.3–98.0)
MONO%: 27 % — ABNORMAL HIGH (ref 0.0–14.0)
RBC: 4.94 10*6/uL (ref 4.20–5.82)
RDW: 12.7 % (ref 11.0–14.6)
lymph#: 1.1 10*3/uL (ref 0.9–3.3)

## 2013-02-13 LAB — COMPREHENSIVE METABOLIC PANEL (CC13)
ALT: 31 U/L (ref 0–55)
AST: 24 U/L (ref 5–34)
Albumin: 3.5 g/dL (ref 3.5–5.0)
Alkaline Phosphatase: 50 U/L (ref 40–150)
BUN: 8.9 mg/dL (ref 7.0–26.0)
Calcium: 9 mg/dL (ref 8.4–10.4)
Chloride: 107 mEq/L (ref 98–107)
Potassium: 3.8 mEq/L (ref 3.5–5.1)
Sodium: 142 mEq/L (ref 136–145)
Total Protein: 6.8 g/dL (ref 6.4–8.3)

## 2013-02-13 NOTE — Telephone Encounter (Signed)
Spoke with pt at home and informed pt re:  Platelet counts stable at 57,000 as per md's instructions.   Pt voiced understanding.

## 2013-02-14 ENCOUNTER — Telehealth: Payer: Self-pay | Admitting: Oncology

## 2013-02-14 NOTE — Telephone Encounter (Signed)
spoke w/pt gve appt d/t.Marland Kitchentd

## 2013-04-02 ENCOUNTER — Encounter: Payer: Self-pay | Admitting: *Deleted

## 2013-04-02 NOTE — Progress Notes (Signed)
RECEIVED A FAX FROM [facility] CONCERNING A PRIOR AUTHORIZATION FOR PROMACTA. THIS REQUEST WAS PLACED IN THE MANAGED CARE BIN. 

## 2013-04-03 ENCOUNTER — Encounter: Payer: Self-pay | Admitting: Oncology

## 2013-04-03 NOTE — Progress Notes (Signed)
04/02/14-Called [employer], [phone], for promacta 50mg  pa; should receive response within 24-72 hours case # [id number].

## 2013-04-09 ENCOUNTER — Encounter: Payer: Self-pay | Admitting: Oncology

## 2013-04-09 NOTE — Progress Notes (Signed)
[place], [id number], approved promacta from 04/05/13-07/05/13 auth # [id number]

## 2013-04-10 ENCOUNTER — Other Ambulatory Visit (HOSPITAL_BASED_OUTPATIENT_CLINIC_OR_DEPARTMENT_OTHER): Payer: Medicare HMO

## 2013-04-10 DIAGNOSIS — D693 Immune thrombocytopenic purpura: Secondary | ICD-10-CM

## 2013-04-10 LAB — CBC WITH DIFFERENTIAL/PLATELET
Basophils Absolute: 0 10*3/uL (ref 0.0–0.1)
EOS%: 0.7 % (ref 0.0–7.0)
Eosinophils Absolute: 0 10*3/uL (ref 0.0–0.5)
HGB: 14.6 g/dL (ref 13.0–17.1)
MCV: 89.2 fL (ref 79.3–98.0)
MONO%: 27 % — ABNORMAL HIGH (ref 0.0–14.0)
NEUT#: 0.8 10*3/uL — ABNORMAL LOW (ref 1.5–6.5)
RBC: 4.99 10*6/uL (ref 4.20–5.82)
RDW: 12.6 % (ref 11.0–14.6)
lymph#: 1.1 10*3/uL (ref 0.9–3.3)

## 2013-04-13 ENCOUNTER — Telehealth: Payer: Self-pay | Admitting: Oncology

## 2013-04-13 NOTE — Telephone Encounter (Signed)
pt called to change appt due to family conflict...first available 8.1.14.Marland KitchenMarland Kitchenpt concern that Dr. Reece Agar would ike to get him in earlier...emialed Dr. Reece Agar to confirm

## 2013-04-17 ENCOUNTER — Ambulatory Visit: Payer: Medicare Other | Admitting: Oncology

## 2013-04-19 ENCOUNTER — Telehealth: Payer: Self-pay | Admitting: Oncology

## 2013-04-19 NOTE — Telephone Encounter (Signed)
emailed Dr. Reece Agar that pt wants earlier appt...Dr. Reece Agar responded that he would see pt 5.23.14 @ 12:30pm...advised pt...pt ok and aware

## 2013-04-24 ENCOUNTER — Ambulatory Visit: Payer: Self-pay | Admitting: Oncology

## 2013-05-11 ENCOUNTER — Telehealth: Payer: Self-pay | Admitting: Oncology

## 2013-05-11 ENCOUNTER — Ambulatory Visit (HOSPITAL_BASED_OUTPATIENT_CLINIC_OR_DEPARTMENT_OTHER): Payer: Medicare HMO | Admitting: Oncology

## 2013-05-11 VITALS — BP 140/70 | HR 53 | Temp 98.0°F | Resp 18 | Ht 71.0 in | Wt 206.6 lb

## 2013-05-11 DIAGNOSIS — D693 Immune thrombocytopenic purpura: Secondary | ICD-10-CM

## 2013-05-11 DIAGNOSIS — D72821 Monocytosis (symptomatic): Secondary | ICD-10-CM

## 2013-05-11 DIAGNOSIS — D72819 Decreased white blood cell count, unspecified: Secondary | ICD-10-CM

## 2013-05-11 NOTE — Progress Notes (Signed)
Hematology and Oncology Follow Up Visit  Billy [name] [id number] 1941/05/17 72 y.o. 05/11/2013 7:02 PM   Principle Diagnosis: Encounter Diagnoses  Name Primary?  . Chronic idiopathic monocytosis Yes  . Chronic ITP (idiopathic thrombocytopenia)   . 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 He had mild thrombocytopenia documented as early as March 2007 when platelet count was 123,000. A CBC done in February 2009 showed platelets down to 42,000.  He was treated with observation alone until November 2009 when platelet count drifted down to 25,000. He had a minimal response to steroids and no improvement with addition of danazol added in January 2010. Due to the development of some atypical features with leukopenia and monocytosis I did a bone marrow aspiration and biopsy on 06/12/2009 to exclude additional pathology. Bone marrow and cytogenetic studies on the marrow were normal. No excess blasts. No excess plasma cells.  He was given a trial of Rituxan antibody between 07/01/2009 and 07/22/2009. He had no response. He was started on weekly Nplate injections on 10/10/2009 which was successful in maintaining his platelet counts above 50,000 with peak counts as high as 144,000. He stayed on the drug for 2-1/2 years.  I convinced him to begin a trial of the oral thrombopoietin receptor agonist, Promacta (eltrombopag) which was started in March 2013. He has tolerated this product well and once again we have been able to maintain his platelet count at or above 50,000.. We have been monitoring his liver functions periodically and they have remained normal.  He is this. He has not had any interim infections. He does fatigue easy. 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 He finds it is hard  to do a full days work.   Medications: reviewed  Allergies:  Allergies   Allergen Reactions  . Penicillins Other (See Comments)    Doesn't remember was infant    Review of Systems: Constitutional:   See above  Respiratory: No cough or dyspnea Cardiovascular:  No chest pain or palpitations Gastrointestinal: No abdominal pain or change in bowel habit Genito-Urinary: No urinary tract symptoms Musculoskeletal: No muscle or bone pain Neurologic: No headache or change in vision Skin: No rash or ecchymosis Remaining ROS negative.  Physical Exam: Blood pressure 140/70, pulse 53, temperature 98 F (36.7 C), temperature source Oral, resp. rate 18, height 5\' 11"  (1.803 m), weight 206 lb 9.6 oz (93.713 kg). Wt Readings from Last 3 Encounters:  05/11/13 206 lb 9.6 oz (93.713 kg)  10/17/12 208 lb 12.8 oz (94.711 kg)  08/08/12 208 lb 9.6 oz (94.62 kg)     General appearance: Well-nourished Caucasian man [name]: Pharynx no erythema or exudate Lymph nodes: No adenopathy Breasts: Lungs: Clear to auscultation resonant to percussion Heart: Regular rhythm no murmur Abdomen: Soft, nontender, no mass, no organomegaly Extremities: No edema, no calf tenderness Musculoskeletal: GU: Vascular: No cyanosis Neurologic: Mental status intact, cranial nerves intact, motor strength 5 over 5, reflexes 1+ symmetric Skin: No rash or ecchymosis  Lab Results: Lab Results  Component Value Date   WBC 2.7* 04/10/2013   HGB 14.6 04/10/2013   HCT 44.5 04/10/2013   MCV 89.2 04/10/2013   PLT 55* 04/10/2013     Chemistry      Component Value Date/Time   NA 142 02/13/2013 1009   NA 143 06/23/2012 1230   K 3.8  02/13/2013 1009   K 4.2 06/23/2012 1230   CL 107 02/13/2013 1009   CL 105 06/23/2012 1230   CO2 29 02/13/2013 1009   CO2 30 06/23/2012 1230   BUN 8.9 02/13/2013 1009   BUN 9 06/23/2012 1230   CREATININE 0.9 02/13/2013 1009   CREATININE 1.02 06/23/2012 1230      Component Value Date/Time   CALCIUM 9.0 02/13/2013 1009   CALCIUM 9.1 06/23/2012 1230   ALKPHOS 50 02/13/2013 1009   ALKPHOS 45  06/23/2012 1230   AST 24 02/13/2013 1009   AST 20 06/23/2012 1230   ALT 31 02/13/2013 1009   ALT 19 06/23/2012 1230   BILITOT 0.57 02/13/2013 1009   BILITOT 0.9 06/23/2012 1230       Radiological Studies: No results found.  Impression: #1. Chronic ITP.  Platelet count in an acceptable range on oral Promacta  Plan: Continue the same  .  #2. Leukopenia with monocytosis.  Nondiagnostic bone marrow biopsy in the past. I assume that this may be on an immune basis related to his ITP.   #3. Essential hypertension    CC:. Dr. Elias [name]   Plan:   CC:Marland Kitchen    Billy Feinstein, MD 5/23/20147:02 PM

## 2013-05-31 ENCOUNTER — Other Ambulatory Visit (HOSPITAL_BASED_OUTPATIENT_CLINIC_OR_DEPARTMENT_OTHER): Payer: Medicare HMO | Admitting: Lab

## 2013-05-31 DIAGNOSIS — D693 Immune thrombocytopenic purpura: Secondary | ICD-10-CM

## 2013-05-31 LAB — CBC WITH DIFFERENTIAL/PLATELET
BASO%: 1.5 % (ref 0.0–2.0)
EOS%: 0.8 % (ref 0.0–7.0)
MCH: 30.2 pg (ref 27.2–33.4)
MCV: 88.6 fL (ref 79.3–98.0)
MONO%: 24.7 % — ABNORMAL HIGH (ref 0.0–14.0)
RBC: 4.86 10*6/uL (ref 4.20–5.82)
RDW: 12.8 % (ref 11.0–14.6)
lymph#: 1 10*3/uL (ref 0.9–3.3)

## 2013-05-31 LAB — COMPREHENSIVE METABOLIC PANEL (CC13)
AST: 21 U/L (ref 5–34)
Albumin: 3.6 g/dL (ref 3.5–5.0)
Alkaline Phosphatase: 48 U/L (ref 40–150)
Calcium: 9 mg/dL (ref 8.4–10.4)
Chloride: 106 mEq/L (ref 98–107)
Glucose: 108 mg/dl — ABNORMAL HIGH (ref 70–99)
Potassium: 4 mEq/L (ref 3.5–5.1)
Sodium: 142 mEq/L (ref 136–145)
Total Protein: 6.7 g/dL (ref 6.4–8.3)

## 2013-07-10 ENCOUNTER — Other Ambulatory Visit: Payer: Self-pay | Admitting: Oncology

## 2013-07-11 ENCOUNTER — Encounter: Payer: Self-pay | Admitting: *Deleted

## 2013-07-11 NOTE — Progress Notes (Signed)
Received prior authorization request for Promacta 50 mg tablet from [facility]. Forwarded to managed care.

## 2013-07-13 ENCOUNTER — Encounter: Payer: Self-pay | Admitting: Oncology

## 2013-07-13 ENCOUNTER — Telehealth: Payer: Self-pay | Admitting: Medical Oncology

## 2013-07-13 NOTE — Progress Notes (Signed)
[employer]  Denied his Promacta 50 mg and I called [phone] and was transferred to [phone]. I spoke to [facility] who filed an appeal. She said to send supporting documentation to [phone] Appeal ref # [id number].  I spoke to the patient's wife Billy [name] and said we were working on it.    07/16/13 Fax from [place] and they have approved the Promacta 50 mg 30/30 until 10/16/13. Ref #[id number].

## 2013-07-13 NOTE — Telephone Encounter (Signed)
Prior auth needed for [employer] . I put it in [employer].

## 2013-07-16 ENCOUNTER — Encounter: Payer: Self-pay | Admitting: *Deleted

## 2013-07-16 NOTE — Progress Notes (Signed)
RECEIVED A FAX FROM [facility] CONCERNING A PRIOR AUTHORIZATION FOR PROMACTA. THIS REQUEST WAS PLACED IN THE MANAGED CARE BIN. 

## 2013-07-20 ENCOUNTER — Ambulatory Visit: Payer: Self-pay | Admitting: Oncology

## 2013-07-26 ENCOUNTER — Other Ambulatory Visit (HOSPITAL_BASED_OUTPATIENT_CLINIC_OR_DEPARTMENT_OTHER): Payer: Medicare HMO

## 2013-07-26 ENCOUNTER — Telehealth: Payer: Self-pay | Admitting: *Deleted

## 2013-07-26 DIAGNOSIS — D693 Immune thrombocytopenic purpura: Secondary | ICD-10-CM

## 2013-07-26 LAB — CBC WITH DIFFERENTIAL/PLATELET
Eosinophils Absolute: 0 10*3/uL (ref 0.0–0.5)
MONO#: 0.9 10*3/uL (ref 0.1–0.9)
MONO%: 27.4 % — ABNORMAL HIGH (ref 0.0–14.0)
NEUT#: 1.1 10*3/uL — ABNORMAL LOW (ref 1.5–6.5)
RBC: 4.9 10*6/uL (ref 4.20–5.82)
RDW: 13 % (ref 11.0–14.6)
WBC: 3.2 10*3/uL — ABNORMAL LOW (ref 4.0–10.3)
lymph#: 1.2 10*3/uL (ref 0.9–3.3)

## 2013-07-26 NOTE — Telephone Encounter (Signed)
Left voice message re: platelets 67 which has improved; if any questions to call office.

## 2013-07-26 NOTE — Telephone Encounter (Signed)
Message copied by Gala Romney on Thu Jul 26, 2013 12:30 PM ------      Message from: Levert Feinstein      Created: Thu Jul 26, 2013 11:46 AM       Call - platelets 67,000 = OK ------

## 2013-08-02 ENCOUNTER — Telehealth: Payer: Self-pay | Admitting: *Deleted

## 2013-08-02 NOTE — Telephone Encounter (Signed)
Wife called requesting script for promacta for documentation purposes only for traveling to [place].  Script done for Promacta 50mg  one daily  - do not fill - for travel documentation only.

## 2013-09-20 ENCOUNTER — Other Ambulatory Visit (HOSPITAL_BASED_OUTPATIENT_CLINIC_OR_DEPARTMENT_OTHER): Payer: Commercial Managed Care - HMO | Admitting: Lab

## 2013-09-20 DIAGNOSIS — D693 Immune thrombocytopenic purpura: Secondary | ICD-10-CM

## 2013-09-20 LAB — CBC WITH DIFFERENTIAL/PLATELET
EOS%: 0.6 % (ref 0.0–7.0)
Eosinophils Absolute: 0 10*3/uL (ref 0.0–0.5)
MCH: 30 pg (ref 27.2–33.4)
MCV: 89.6 fL (ref 79.3–98.0)
MONO%: 28.4 % — ABNORMAL HIGH (ref 0.0–14.0)
NEUT#: 0.9 10*3/uL — ABNORMAL LOW (ref 1.5–6.5)
RBC: 4.7 10*6/uL (ref 4.20–5.82)
RDW: 13.1 % (ref 11.0–14.6)

## 2013-09-20 LAB — COMPREHENSIVE METABOLIC PANEL (CC13)
Albumin: 3.5 g/dL (ref 3.5–5.0)
Alkaline Phosphatase: 43 U/L (ref 40–150)
Chloride: 107 mEq/L (ref 98–109)
Glucose: 99 mg/dl (ref 70–140)
Potassium: 4.2 mEq/L (ref 3.5–5.1)
Sodium: 143 mEq/L (ref 136–145)
Total Protein: 6.6 g/dL (ref 6.4–8.3)

## 2013-10-18 ENCOUNTER — Encounter: Payer: Self-pay | Admitting: Oncology

## 2013-10-18 ENCOUNTER — Encounter: Payer: Self-pay | Admitting: *Deleted

## 2013-10-18 NOTE — Progress Notes (Signed)
[place], [phone], for promacta 50mg  pa; should receive response within 24-72 hours case # [id number].

## 2013-10-18 NOTE — Progress Notes (Signed)
RECEIVED A FAX FROM [facility] CONCERNING A PRIOR AUTHORIZATION FOR PROMACTA. THIS REQUEST WAS PLACED IN THE MANAGED CARE BIN. 

## 2013-10-22 ENCOUNTER — Encounter: Payer: Self-pay | Admitting: Oncology

## 2013-10-22 NOTE — Progress Notes (Signed)
[employer] denied patient's promacta because he doesn't have ITP and he has not tried and failed promacta. Called the appeals dept. [phone] and told them everything they denied him for he has and has tried.  Faxed clinical information to the Expedited Appeals Dept @ [phone] case # [id number].

## 2013-10-23 ENCOUNTER — Encounter: Payer: Self-pay | Admitting: Oncology

## 2013-10-23 NOTE — Progress Notes (Signed)
[employer]'s appeals dept approved pomalyst 50mg  from 10/23/13-04/22/14.

## 2013-11-09 ENCOUNTER — Ambulatory Visit (HOSPITAL_BASED_OUTPATIENT_CLINIC_OR_DEPARTMENT_OTHER): Payer: Medicare HMO | Admitting: Oncology

## 2013-11-09 ENCOUNTER — Telehealth: Payer: Self-pay | Admitting: Oncology

## 2013-11-09 ENCOUNTER — Encounter (INDEPENDENT_AMBULATORY_CARE_PROVIDER_SITE_OTHER): Payer: Self-pay

## 2013-11-09 VITALS — BP 141/70 | HR 58 | Temp 97.5°F | Resp 18 | Ht 71.0 in | Wt 204.7 lb

## 2013-11-09 DIAGNOSIS — I10 Essential (primary) hypertension: Secondary | ICD-10-CM

## 2013-11-09 DIAGNOSIS — D72819 Decreased white blood cell count, unspecified: Secondary | ICD-10-CM

## 2013-11-09 DIAGNOSIS — D72821 Monocytosis (symptomatic): Secondary | ICD-10-CM

## 2013-11-09 DIAGNOSIS — D693 Immune thrombocytopenic purpura: Secondary | ICD-10-CM

## 2013-11-09 NOTE — Telephone Encounter (Signed)
ret lab appt made per AVS and CAL given to pt shh

## 2013-11-11 NOTE — Progress Notes (Signed)
Hematology and Oncology Follow Up Visit  Billy [name] [id number] Jun 19, 1941 72 y.o. 11/11/2013 5:22 PM   Principle Diagnosis: Encounter Diagnoses  Name Primary?  . Chronic ITP (idiopathic thrombocytopenia) Yes  . Leukopenia   . Chronic idiopathic monocytosis      Interim History:   Followup visit for this pleasant  27 -year-old retired [employer] diagnosed with ITP in February 2009. He had mild thrombocytopenia documented as early as March 2007 when platelet count was 123,000. A CBC done in February 2009 showed platelets down to 42,000.  He was treated with observation alone until November 2009 when platelet count drifted down to 25,000. He had a minimal response to steroids and no improvement with addition of danazol added in January 2010. Due to the development of some atypical features with leukopenia and monocytosis I did a bone marrow aspiration and biopsy on 06/12/2009 to exclude additional pathology. Bone marrow and cytogenetic studies on the marrow were normal. No excess blasts. No excess plasma cells.  He was given a trial of Rituxan antibody between 07/01/2009 and 07/22/2009. He had no response. He was started on weekly Nplate injections on 10/10/2009 which was successful in maintaining his platelet counts above 50,000 with peak counts as high as 144,000. He stayed on the drug for 2-1/2 years.  I convinced him to begin a trial of the oral thrombopoietin receptor agonist, Promacta (eltrombopag) which was started in March 2013. He has tolerated this product well and once again we have been able to maintain his platelet count at or above 50,000.. We have been monitoring his liver functions periodically and they have remained normal.  He has had no interim medical problems. He, got to take a long planned trip to [place] and had a wonderful time.   Medications: reviewed  Allergies:  Allergies  Allergen Reactions  . Penicillins Other (See Comments)    Doesn't remember was infant     Review of Systems: Hematology: No active bleeding or bruising ENT ROS: No sore throat Breast ROS: Respiratory ROS: No cough or dyspnea Cardiovascular ROS:  No chest pain or palpitations Gastrointestinal ROS:  No abdominal pain or change in bowel habit Genito-Urinary ROS: No urinary tract symptoms Musculoskeletal ROS no muscle bone or joint pain Neurological ROS: No headache or change in vision Dermatological ROS: No rash Remaining ROS negative.  Physical Exam: Blood pressure 141/70, pulse 58, temperature 97.5 F (36.4 C), temperature source Oral, resp. rate 18, height 5\' 11"  (1.803 m), weight 204 lb 11.2 oz (92.851 kg), SpO2 98.00%. Wt Readings from Last 3 Encounters:  11/09/13 204 lb 11.2 oz (92.851 kg)  05/11/13 206 lb 9.6 oz (93.713 kg)  10/17/12 208 lb 12.8 oz (94.711 kg)     General appearance: Well-nourished Caucasian man [name]: Pharynx no erythema, exudate, mass, or ulcer. No thyromegaly or thyroid nodules Lymph nodes: No cervical, supraclavicular, or axillary lymphadenopathy Breasts: Lungs: Clear to auscultation, resonant to percussion throughout Heart: Regular rhythm, no murmur, no gallop, no rub, no click, no edema Abdomen: Soft, nontender, normal bowel sounds, no mass, no organomegaly Extremities: No edema, no calf tenderness Musculoskeletal: no joint deformities GU:  Vascular: Carotid pulses 2+, no bruits, Neurologic: Alert, oriented, PERRLA, , cranial nerves grossly normal, motor strength 5 over 5, reflexes 1+ symmetric, upper body coordination normal, gait normal, Skin: No rash or ecchymosis  Lab Results: CBC W/Diff    Component Value Date/Time   WBC 2.7* 09/20/2013 0910   RBC 4.70 09/20/2013 0910   HGB 14.1 09/20/2013 0910  HCT 42.1 09/20/2013 0910   PLT 58* 09/20/2013 0910   MCV 89.6 09/20/2013 0910   MCH 30.0 09/20/2013 0910   MCH 29.5 01/21/2011 1043   MCHC 33.5 09/20/2013 0910   RDW 13.1 09/20/2013 0910   LYMPHSABS 1.0 09/20/2013 0910   MONOABS 0.8  09/20/2013 0910   EOSABS 0.0 09/20/2013 0910   BASOSABS 0.0 09/20/2013 0910     Chemistry      Component Value Date/Time   NA 143 09/20/2013 0910   NA 143 06/23/2012 1230   K 4.2 09/20/2013 0910   K 4.2 06/23/2012 1230   CL 106 05/31/2013 0920   CL 105 06/23/2012 1230   CO2 28 09/20/2013 0910   CO2 30 06/23/2012 1230   BUN 10.4 09/20/2013 0910   BUN 9 06/23/2012 1230   CREATININE 0.8 09/20/2013 0910   CREATININE 1.02 06/23/2012 1230      Component Value Date/Time   CALCIUM 9.1 09/20/2013 0910   CALCIUM 9.1 06/23/2012 1230   ALKPHOS 43 09/20/2013 0910   ALKPHOS 45 06/23/2012 1230   AST 21 09/20/2013 0910   AST 20 06/23/2012 1230   ALT 23 09/20/2013 0910   ALT 19 06/23/2012 1230   BILITOT 0.63 09/20/2013 0910   BILITOT 0.9 06/23/2012 1230       Radiological Studies: No results found.  Impression:  #1. Chronic ITP.  Platelet count in an acceptable range on oral Promacta  Plan: Continue the same  .  #2. Leukopenia with monocytosis.  Nondiagnostic bone marrow biopsy in the past. I assume that this may be on an immune basis related to his ITP.   #3. Essential hypertension     CC: Patient Care Team: Sissy Hoff, MD as PCP - General (Family Medicine) Levert Feinstein, MD as Consulting Physician (Hematology and Oncology)   Levert Feinstein, MD 11/23/20145:22 PM

## 2013-12-25 ENCOUNTER — Other Ambulatory Visit: Payer: Self-pay | Admitting: Oncology

## 2014-02-08 ENCOUNTER — Other Ambulatory Visit (HOSPITAL_BASED_OUTPATIENT_CLINIC_OR_DEPARTMENT_OTHER): Payer: Medicare HMO

## 2014-02-08 ENCOUNTER — Telehealth: Payer: Self-pay | Admitting: *Deleted

## 2014-02-08 DIAGNOSIS — D72821 Monocytosis (symptomatic): Secondary | ICD-10-CM

## 2014-02-08 DIAGNOSIS — D693 Immune thrombocytopenic purpura: Secondary | ICD-10-CM

## 2014-02-08 DIAGNOSIS — D72819 Decreased white blood cell count, unspecified: Secondary | ICD-10-CM

## 2014-02-08 LAB — CBC WITH DIFFERENTIAL/PLATELET
BASO%: 1.4 % (ref 0.0–2.0)
Basophils Absolute: 0 10*3/uL (ref 0.0–0.1)
EOS ABS: 0 10*3/uL (ref 0.0–0.5)
EOS%: 0.5 % (ref 0.0–7.0)
HCT: 45 % (ref 38.4–49.9)
HEMOGLOBIN: 14.7 g/dL (ref 13.0–17.1)
LYMPH%: 38.4 % (ref 14.0–49.0)
MCH: 29.5 pg (ref 27.2–33.4)
MCHC: 32.7 g/dL (ref 32.0–36.0)
MCV: 90.1 fL (ref 79.3–98.0)
MONO#: 0.8 10*3/uL (ref 0.1–0.9)
MONO%: 28.1 % — ABNORMAL HIGH (ref 0.0–14.0)
NEUT%: 31.6 % — ABNORMAL LOW (ref 39.0–75.0)
NEUTROS ABS: 0.9 10*3/uL — AB (ref 1.5–6.5)
Platelets: 69 10*3/uL — ABNORMAL LOW (ref 140–400)
RBC: 5 10*6/uL (ref 4.20–5.82)
RDW: 12.2 % (ref 11.0–14.6)
WBC: 2.9 10*3/uL — ABNORMAL LOW (ref 4.0–10.3)
lymph#: 1.1 10*3/uL (ref 0.9–3.3)

## 2014-02-08 LAB — COMPREHENSIVE METABOLIC PANEL (CC13)
ALBUMIN: 3.9 g/dL (ref 3.5–5.0)
ALT: 23 U/L (ref 0–55)
ANION GAP: 8 meq/L (ref 3–11)
AST: 24 U/L (ref 5–34)
Alkaline Phosphatase: 46 U/L (ref 40–150)
BUN: 13 mg/dL (ref 7.0–26.0)
CO2: 26 meq/L (ref 22–29)
Calcium: 9.4 mg/dL (ref 8.4–10.4)
Chloride: 108 mEq/L (ref 98–109)
Creatinine: 0.9 mg/dL (ref 0.7–1.3)
GLUCOSE: 99 mg/dL (ref 70–140)
POTASSIUM: 4 meq/L (ref 3.5–5.1)
SODIUM: 142 meq/L (ref 136–145)
TOTAL PROTEIN: 6.9 g/dL (ref 6.4–8.3)
Total Bilirubin: 0.58 mg/dL (ref 0.20–1.20)

## 2014-02-08 NOTE — Telephone Encounter (Signed)
Message copied by Jesse Fall on Fri Feb 08, 2014  1:44 PM ------      Message from: Annia Belt      Created: Fri Feb 08, 2014 12:14 PM       OK to call pt w lab: no change from baseline ------

## 2014-02-08 NOTE — Telephone Encounter (Signed)
Pt notified of stable lab results per Dr Beryle Beams.

## 2014-02-18 ENCOUNTER — Encounter: Payer: Self-pay | Admitting: Oncology

## 2014-06-05 ENCOUNTER — Other Ambulatory Visit: Payer: Self-pay | Admitting: Oncology

## 2014-09-03 ENCOUNTER — Encounter: Payer: Self-pay | Admitting: Oncology

## 2014-09-03 ENCOUNTER — Ambulatory Visit (INDEPENDENT_AMBULATORY_CARE_PROVIDER_SITE_OTHER): Payer: Commercial Managed Care - HMO | Admitting: Oncology

## 2014-09-03 VITALS — BP 142/77 | HR 59 | Temp 98.1°F | Ht 71.0 in | Wt 208.6 lb

## 2014-09-03 DIAGNOSIS — D72819 Decreased white blood cell count, unspecified: Secondary | ICD-10-CM

## 2014-09-03 DIAGNOSIS — D72821 Monocytosis (symptomatic): Secondary | ICD-10-CM

## 2014-09-03 DIAGNOSIS — D693 Immune thrombocytopenic purpura: Secondary | ICD-10-CM

## 2014-09-03 DIAGNOSIS — I10 Essential (primary) hypertension: Secondary | ICD-10-CM

## 2014-09-03 LAB — COMPREHENSIVE METABOLIC PANEL
ALT: 23 U/L (ref 0–53)
AST: 22 U/L (ref 0–37)
Albumin: 4.1 g/dL (ref 3.5–5.2)
Alkaline Phosphatase: 37 U/L — ABNORMAL LOW (ref 39–117)
BUN: 13 mg/dL (ref 6–23)
CHLORIDE: 104 meq/L (ref 96–112)
CO2: 28 mEq/L (ref 19–32)
CREATININE: 0.94 mg/dL (ref 0.50–1.35)
Calcium: 9.1 mg/dL (ref 8.4–10.5)
Glucose, Bld: 100 mg/dL — ABNORMAL HIGH (ref 70–99)
Potassium: 4.1 mEq/L (ref 3.5–5.3)
Sodium: 138 mEq/L (ref 135–145)
Total Bilirubin: 0.9 mg/dL (ref 0.2–1.2)
Total Protein: 6.5 g/dL (ref 6.0–8.3)

## 2014-09-03 NOTE — Patient Instructions (Signed)
To lab today Change lab location to [place] CBC every 2 months; chemistry profile every 4 months MD visit 6 months  March 15 at 9:15 AM

## 2014-09-04 LAB — CBC WITH DIFFERENTIAL/PLATELET
BASOS ABS: 0 10*3/uL (ref 0.0–0.1)
Basophils Relative: 0 % (ref 0–1)
Eosinophils Absolute: 0 10*3/uL (ref 0.0–0.7)
Eosinophils Relative: 1 % (ref 0–5)
HCT: 43.4 % (ref 39.0–52.0)
Hemoglobin: 14.7 g/dL (ref 13.0–17.0)
LYMPHS PCT: 37 % (ref 12–46)
Lymphs Abs: 0.7 10*3/uL (ref 0.7–4.0)
MCH: 30.2 pg (ref 26.0–34.0)
MCHC: 33.9 g/dL (ref 30.0–36.0)
MCV: 89.1 fL (ref 78.0–100.0)
Monocytes Absolute: 0.7 10*3/uL (ref 0.1–1.0)
Monocytes Relative: 34 % — ABNORMAL HIGH (ref 3–12)
Neutro Abs: 0.6 10*3/uL — ABNORMAL LOW (ref 1.7–7.7)
Neutrophils Relative %: 28 % — ABNORMAL LOW (ref 43–77)
PLATELETS: 70 10*3/uL — AB (ref 150–400)
RBC: 4.87 MIL/uL (ref 4.22–5.81)
RDW: 13 % (ref 11.5–15.5)
WBC: 2 10*3/uL — AB (ref 4.0–10.5)

## 2014-09-04 LAB — PATHOLOGIST SMEAR REVIEW

## 2014-09-04 NOTE — Progress Notes (Signed)
Patient ID: Billy [name], male   DOB: 1941/10/18, 73 y.o.   MRN: [id number] Hematology and Oncology Follow Up Visit  Billy [name] [id number] 1941-03-16 73 y.o. 09/04/2014 5:11 PM   Principle Diagnosis: Encounter Diagnoses  Name Primary?  . Chronic ITP (idiopathic thrombocytopenia) Yes  . Leukopenia   . 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 He had mild thrombocytopenia documented as early as March 2007 when platelet count was 123,000. A CBC done in February 2009 showed platelets down to 42,000.  He was treated with observation alone until November 2009 when platelet count drifted down to 25,000. He had a minimal response to steroids and no improvement with addition of danazol added in January 2010. Due to the development of some atypical features with leukopenia and monocytosis I did a bone marrow aspiration and biopsy on 06/12/2009 to exclude additional pathology. Bone marrow and cytogenetic studies on the marrow were normal. No excess blasts. No excess plasma cells.  He was given a trial of Rituxan antibody between 07/01/2009 and 07/22/2009. He had no response. He was started on weekly Nplate injections on 32/99/2426 which was successful in maintaining his platelet counts above 50,000 with peak counts as high as 144,000. He stayed on the drug for 2-1/2 years.  I convinced him to begin a trial of the oral thrombopoietin receptor agonist, Promacta (eltrombopag) which was started in March 2013. He has tolerated this product well and  we have been able to maintain his platelet count at or above 50,000.. I have been monitoring his liver functions periodically and they have remained normal.    He has had no interim medical problems. He is enjoying his retirement. 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   Medications: reviewed  Allergies:  Allergies  Allergen Reactions  . Penicillins Other (See Comments)    Doesn't remember was infant    Review of Systems: Hematology:  No bleeding or bruising ENT ROS: No sore throat Breast ROS:  Respiratory ROS: No cough or dyspnea Cardiovascular ROS:  No chest pain or palpitations Gastrointestinal ROS:  No abdominal pain or change in bowel habit  Genito-Urinary ROS: No urinary tract symptoms Musculoskeletal ROS: No muscle bone or joint pain Neurological ROS: No headache or change in vision Dermatological ROS: No rash Remaining ROS negative:   Physical Exam: Blood pressure 142/77, pulse 59, temperature 98.1 F (36.7 C), height $RemoveBe'5\' 11"'KvduSmZnd$  (1.803 m), weight 208 lb 9.6 oz (94.62 kg), SpO2 95.00%. Wt Readings from Last 3 Encounters:  09/03/14 208 lb 9.6 oz (94.62 kg)  11/09/13 204 lb 11.2 oz (92.851 kg)  05/11/13 206 lb 9.6 oz (93.713 kg)     General appearance: Well-nourished Caucasian man [name]: Pharynx no erythema, exudate, mass, or ulcer. No thyromegaly or thyroid nodules Lymph nodes: No cervical, supraclavicular, or axillary lymphadenopathy Breasts: Lungs: Clear to auscultation, resonant to percussion throughout Heart: Regular rhythm, no murmur, no gallop, no rub, no click, no edema Abdomen: Soft, nontender, normal bowel sounds, no mass, no organomegaly Extremities: No edema, no calf tenderness Musculoskeletal: no joint deformities GU:  Vascular: Carotid pulses 2+, no bruits, Neurologic: Alert, oriented, PERRLA, optic discs sharp and vessels normal, no hemorrhage or exudate, cranial nerves grossly normal, motor strength 5 over 5, reflexes 1+ symmetric, upper body coordination normal, gait normal, Skin: No rash or ecchymosis  Lab Results: CBC W/Diff    Component Value Date/Time   WBC 2.0* 09/03/2014 0918   WBC 2.9* 02/08/2014 1016   RBC 4.87 09/03/2014 0918   RBC 5.00 02/08/2014 1016   HGB 14.7 09/03/2014 0918   HGB 14.7  02/08/2014 1016   HCT 43.4 09/03/2014 0918   HCT 45.0 02/08/2014 1016   PLT 70* 09/03/2014 0918   PLT 69* 02/08/2014 1016   MCV 89.1 09/03/2014 0918   MCV 90.1 02/08/2014 1016   MCH 30.2 09/03/2014 0918   MCH 29.5 02/08/2014 1016   MCHC 33.9 09/03/2014 0918   MCHC 32.7 02/08/2014 1016   RDW 13.0 09/03/2014 0918   RDW 12.2 02/08/2014 1016   LYMPHSABS 0.7 09/03/2014 0918   LYMPHSABS 1.1 02/08/2014 1016   MONOABS 0.7 09/03/2014 0918   MONOABS 0.8 02/08/2014 1016   EOSABS 0.0 09/03/2014 0918   EOSABS 0.0 02/08/2014 1016   BASOSABS 0.0 09/03/2014 0918   BASOSABS 0.0 02/08/2014 1016     Chemistry      Component Value Date/Time   NA 138 09/03/2014 0918   NA 142 02/08/2014 1016   K 4.1 09/03/2014 0918   K 4.0 02/08/2014 1016   CL 104 09/03/2014 0918   CL 106 05/31/2013 0920   CO2 28 09/03/2014 0918   CO2 26 02/08/2014 1016   BUN 13 09/03/2014 0918   BUN 13.0 02/08/2014 1016   CREATININE 0.94 09/03/2014 0918   CREATININE 0.9 02/08/2014 1016   CREATININE 1.02 06/23/2012 1230      Component Value Date/Time   CALCIUM 9.1 09/03/2014 0918   CALCIUM 9.4 02/08/2014 1016   ALKPHOS 37* 09/03/2014 0918   ALKPHOS 46 02/08/2014 1016   AST 22 09/03/2014 0918   AST 24 02/08/2014 1016   ALT 23 09/03/2014 0918   ALT 23 02/08/2014 1016   BILITOT 0.9 09/03/2014 0918   BILITOT 0.58 02/08/2014 1016       Radiological Studies: No results found.  Impression:  #1. Chronic ITP Platelet count remains in an acceptable range on Promacta.  #2. Unexplained leukopenia with monocytosis White count today lower than his usual baseline. He has not had a problem with recurrent infection. I am assuming that his leukopenia is on an immune basis. Although bone marrow biopsy was unremarkable in 2010, possibility of a developing myelodysplastic syndrome still exists. I will continue to monitor his counts. If there is any further fall in the white count I may need to repeat a bone marrow biopsy.  #3. Essential hypertension Controlled on current  medications.   CC: Patient Care Team: Gara Kroner, MD as PCP - General (Family Medicine) Annia Belt, MD as Consulting Physician (Hematology and Oncology)   Annia Belt, MD 9/16/20155:11 PM

## 2014-09-05 ENCOUNTER — Telehealth: Payer: Self-pay | Admitting: *Deleted

## 2014-09-05 NOTE — Telephone Encounter (Signed)
Pt called/informed platelet count of 70,000 and white count of 2,000, which is lower than baseline and will continue to monitor per Dr Beryle Beams. Informed to be mindful of crowds and people w/colds,etc.

## 2014-09-05 NOTE — Telephone Encounter (Signed)
Message copied by Ebbie Latus on Thu Sep 05, 2014  9:49 AM ------      Message from: Annia Belt      Created: Wed Sep 04, 2014 11:28 AM       Call pt: platelets OK at 70,000.  White count running lower than baseline at 2,000. Will continue to monitor. ------

## 2014-11-05 ENCOUNTER — Other Ambulatory Visit: Payer: Commercial Managed Care - HMO

## 2014-11-20 ENCOUNTER — Other Ambulatory Visit: Payer: Self-pay | Admitting: Oncology

## 2014-11-20 ENCOUNTER — Other Ambulatory Visit: Payer: Self-pay | Admitting: *Deleted

## 2014-11-20 MED ORDER — ELTROMBOPAG OLAMINE 50 MG PO TABS: 50.0000 mg | ORAL_TABLET | Freq: Every day | ORAL | Status: DC

## 2015-01-07 ENCOUNTER — Other Ambulatory Visit: Payer: Commercial Managed Care - HMO

## 2015-01-09 DIAGNOSIS — I10 Essential (primary) hypertension: Secondary | ICD-10-CM | POA: Diagnosis not present

## 2015-01-09 DIAGNOSIS — D485 Neoplasm of uncertain behavior of skin: Secondary | ICD-10-CM | POA: Diagnosis not present

## 2015-01-09 DIAGNOSIS — D72819 Decreased white blood cell count, unspecified: Secondary | ICD-10-CM | POA: Diagnosis not present

## 2015-01-09 DIAGNOSIS — D693 Immune thrombocytopenic purpura: Secondary | ICD-10-CM | POA: Diagnosis not present

## 2015-01-09 DIAGNOSIS — E782 Mixed hyperlipidemia: Secondary | ICD-10-CM | POA: Diagnosis not present

## 2015-01-09 DIAGNOSIS — Z1389 Encounter for screening for other disorder: Secondary | ICD-10-CM | POA: Diagnosis not present

## 2015-01-09 DIAGNOSIS — Z0000 Encounter for general adult medical examination without abnormal findings: Secondary | ICD-10-CM | POA: Diagnosis not present

## 2015-03-03 ENCOUNTER — Ambulatory Visit (INDEPENDENT_AMBULATORY_CARE_PROVIDER_SITE_OTHER): Payer: Commercial Managed Care - HMO | Admitting: Oncology

## 2015-03-03 ENCOUNTER — Encounter: Payer: Self-pay | Admitting: Oncology

## 2015-03-03 VITALS — BP 159/88 | HR 66 | Temp 97.6°F | Ht 71.0 in | Wt 207.6 lb

## 2015-03-03 DIAGNOSIS — D72821 Monocytosis (symptomatic): Secondary | ICD-10-CM | POA: Diagnosis not present

## 2015-03-03 DIAGNOSIS — D693 Immune thrombocytopenic purpura: Secondary | ICD-10-CM | POA: Diagnosis not present

## 2015-03-03 DIAGNOSIS — D72819 Decreased white blood cell count, unspecified: Secondary | ICD-10-CM

## 2015-03-03 DIAGNOSIS — I10 Essential (primary) hypertension: Secondary | ICD-10-CM | POA: Diagnosis not present

## 2015-03-03 LAB — COMPREHENSIVE METABOLIC PANEL
ALK PHOS: 38 U/L — AB (ref 39–117)
ALT: 23 U/L (ref 0–53)
AST: 21 U/L (ref 0–37)
Albumin: 3.9 g/dL (ref 3.5–5.2)
BUN: 10 mg/dL (ref 6–23)
CALCIUM: 9 mg/dL (ref 8.4–10.5)
CO2: 28 mEq/L (ref 19–32)
Chloride: 106 mEq/L (ref 96–112)
Creat: 0.91 mg/dL (ref 0.50–1.35)
Glucose, Bld: 98 mg/dL (ref 70–99)
Potassium: 4 mEq/L (ref 3.5–5.3)
Sodium: 141 mEq/L (ref 135–145)
Total Bilirubin: 0.8 mg/dL (ref 0.2–1.2)
Total Protein: 6.3 g/dL (ref 6.0–8.3)

## 2015-03-03 LAB — SAVE SMEAR

## 2015-03-03 NOTE — Progress Notes (Signed)
Patient ID: Billy [name], male   DOB: 05-May-1941, 74 y.o.   MRN: [id number] Hematology and Oncology Follow Up Visit  Billy [name] [id number] 1941-08-19 74 y.o. 03/03/2015 5:27 PM   Principle Diagnosis: Encounter Diagnoses  Name Primary?  . Chronic ITP (idiopathic thrombocytopenia) Yes  . Leukopenia   . 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 He had mild thrombocytopenia documented as early as March 2007 when platelet count was 123,000. A CBC done in February 2009 showed platelets down to 42,000.  He was treated with observation alone until November 2009 when platelet count drifted down to 25,000. He had a minimal response to steroids and no improvement with addition of danazol added in January 2010. Due to the development of some atypical features with leukopenia and monocytosis I did a bone marrow aspiration and biopsy on 06/12/2009 to exclude additional pathology. Bone marrow and cytogenetic studies on the marrow were normal. No excess blasts. No excess plasma cells.  He was given a trial of Rituxan antibody between 07/01/2009 and 07/22/2009. He had no response. He was started on weekly Nplate injections on 40/09/2724 which was successful in maintaining his platelet counts above 50,000 with peak counts as high as 144,000. He stayed on the drug for 2-1/2 years.  He was transitioned to  the oral thrombopoietin receptor agonist, Promacta (eltrombopag) which was started in March 2013. He has tolerated this product well and we have been able to maintain his platelet count at or above 50,000. I have been monitoring his liver functions periodically and they have remained normal.    Interim History:   Overall doing well since last visit. He did have a number of colds this winter but never had a full blown flu. He has noted no bleeding problems. No headaches.  Medications: reviewed  Allergies:  Allergies   Allergen Reactions  . Penicillins Other (See Comments)    Doesn't remember was infant    Review of Systems: See history of present illness  Remaining ROS negative:   Physical Exam: Blood pressure 159/88, pulse 66, temperature 97.6 F (36.4 C), temperature source Oral, height 5\' 11"  (1.803 m), weight 207 lb 9.6 oz (94.167 kg), SpO2 98 %. Wt Readings from Last 3 Encounters:  03/03/15 207 lb 9.6 oz (94.167 kg)  09/03/14 208 lb 9.6 oz (94.62 kg)  11/09/13 204 lb 11.2 oz (92.851 kg)     General appearance: well-nourished Caucasian man [name]: Pharynx no erythema, exudate, mass, or ulcer. No thyromegaly or thyroid nodules Lymph nodes: No cervical, supraclavicular, or axillary lymphadenopathy Breasts:  Lungs: Clear to auscultation, resonant to percussion throughout Heart: Regular rhythm, no murmur, no gallop, no rub, no click, no edema Abdomen: Soft, nontender, normal bowel sounds, no mass, no organomegaly Extremities: No edema, no calf tenderness Musculoskeletal: no joint deformities GU:  Vascular: Carotid pulses 2+, no bruits, Neurologic: Alert, oriented, PERRLA, , cranial nerves grossly normal, motor strength 5 over 5, reflexes 1+ symmetric, upper body coordination normal, gait normal, Skin: No rash or ecchymosis  Lab Results: CBC W/Diff    Component Value Date/Time   WBC 2.0* 09/03/2014 0918   WBC 2.9* 02/08/2014 1016   RBC 4.87 09/03/2014 0918   RBC 5.00 02/08/2014 1016   HGB 14.7 09/03/2014 0918   HGB 14.7 02/08/2014 1016   HCT 43.4 09/03/2014 0918   HCT 45.0 02/08/2014 1016   PLT 70* 09/03/2014 0918   PLT 69* 02/08/2014 1016  MCV 89.1 09/03/2014 0918   MCV 90.1 02/08/2014 1016   MCH 30.2 09/03/2014 0918   MCH 29.5 02/08/2014 1016   MCHC 33.9 09/03/2014 0918   MCHC 32.7 02/08/2014 1016   RDW 13.0 09/03/2014 0918   RDW 12.2 02/08/2014 1016   LYMPHSABS 0.7 09/03/2014 0918   LYMPHSABS 1.1 02/08/2014 1016   MONOABS 0.7 09/03/2014 0918   MONOABS 0.8 02/08/2014  1016   EOSABS 0.0 09/03/2014 0918   EOSABS 0.0 02/08/2014 1016   BASOSABS 0.0 09/03/2014 0918   BASOSABS 0.0 02/08/2014 1016     Chemistry      Component Value Date/Time   NA 138 09/03/2014 0918   NA 142 02/08/2014 1016   K 4.1 09/03/2014 0918   K 4.0 02/08/2014 1016   CL 104 09/03/2014 0918   CL 106 05/31/2013 0920   CO2 28 09/03/2014 0918   CO2 26 02/08/2014 1016   BUN 13 09/03/2014 0918   BUN 13.0 02/08/2014 1016   CREATININE 0.94 09/03/2014 0918   CREATININE 0.9 02/08/2014 1016   CREATININE 1.02 06/23/2012 1230      Component Value Date/Time   CALCIUM 9.1 09/03/2014 0918   CALCIUM 9.4 02/08/2014 1016   ALKPHOS 37* 09/03/2014 0918   ALKPHOS 46 02/08/2014 1016   AST 22 09/03/2014 0918   AST 24 02/08/2014 1016   ALT 23 09/03/2014 0918   ALT 23 02/08/2014 1016   BILITOT 0.9 09/03/2014 0918   BILITOT 0.58 02/08/2014 1016    Today's labs pending   Impression:  #1. Chronic ITP stable with a good partial response on Promacta. Plan: Continue the same  #2. Chronic neutropenia with monocytosis. Suspect this is also on an immune basis.  #3. Essential hypertension  CC: Patient Care Team: Antony Contras, MD as PCP - General (Family Medicine) Annia Belt, MD as Consulting Physician (Hematology and Oncology)   Annia Belt, MD 3/14/20165:27 PM

## 2015-03-03 NOTE — Patient Instructions (Signed)
To lab today Repeat lab tests here every 3 months Return visit 6 months

## 2015-03-04 ENCOUNTER — Other Ambulatory Visit: Payer: Commercial Managed Care - HMO

## 2015-03-04 LAB — CBC WITH DIFFERENTIAL/PLATELET
BASOS PCT: 0 % (ref 0–1)
Basophils Absolute: 0 10*3/uL (ref 0.0–0.1)
EOS ABS: 0 10*3/uL (ref 0.0–0.7)
EOS PCT: 1 % (ref 0–5)
HEMATOCRIT: 44.5 % (ref 39.0–52.0)
HEMOGLOBIN: 14.7 g/dL (ref 13.0–17.0)
LYMPHS ABS: 0.9 10*3/uL (ref 0.7–4.0)
Lymphocytes Relative: 40 % (ref 12–46)
MCH: 30.1 pg (ref 26.0–34.0)
MCHC: 33 g/dL (ref 30.0–36.0)
MCV: 91.2 fL (ref 78.0–100.0)
MONOS PCT: 30 % — AB (ref 3–12)
MPV: 13.9 fL — ABNORMAL HIGH (ref 8.6–12.4)
Monocytes Absolute: 0.7 10*3/uL (ref 0.1–1.0)
Neutro Abs: 0.6 10*3/uL — ABNORMAL LOW (ref 1.7–7.7)
Neutrophils Relative %: 29 % — ABNORMAL LOW (ref 43–77)
Platelets: 77 10*3/uL — ABNORMAL LOW (ref 150–400)
RBC: 4.88 MIL/uL (ref 4.22–5.81)
RDW: 12.9 % (ref 11.5–15.5)
WBC: 2.2 10*3/uL — AB (ref 4.0–10.5)

## 2015-03-06 ENCOUNTER — Telehealth: Payer: Self-pay | Admitting: *Deleted

## 2015-03-06 NOTE — Telephone Encounter (Signed)
-----   Message from Annia Belt, MD sent at 03/04/2015  2:40 PM EDT ----- Call pt:  Platelets stable at 77,000.  WBC 2,200 - slightly better than last time (2,000(

## 2015-03-06 NOTE — Telephone Encounter (Signed)
Pt called / informed platelets stable @ 77,000 and WBC @ 2,200 which is slightly better than last time per Dr Beryle Beams. No questions.

## 2015-03-13 ENCOUNTER — Other Ambulatory Visit: Payer: Self-pay | Admitting: Family Medicine

## 2015-03-13 DIAGNOSIS — L821 Other seborrheic keratosis: Secondary | ICD-10-CM | POA: Diagnosis not present

## 2015-03-13 DIAGNOSIS — D485 Neoplasm of uncertain behavior of skin: Secondary | ICD-10-CM | POA: Diagnosis not present

## 2015-04-21 ENCOUNTER — Other Ambulatory Visit: Payer: Self-pay | Admitting: Family Medicine

## 2015-04-21 DIAGNOSIS — L820 Inflamed seborrheic keratosis: Secondary | ICD-10-CM | POA: Diagnosis not present

## 2015-05-06 ENCOUNTER — Other Ambulatory Visit: Payer: Self-pay | Admitting: Oncology

## 2015-05-06 ENCOUNTER — Other Ambulatory Visit: Payer: Commercial Managed Care - HMO

## 2015-06-02 ENCOUNTER — Other Ambulatory Visit (INDEPENDENT_AMBULATORY_CARE_PROVIDER_SITE_OTHER): Payer: Commercial Managed Care - HMO

## 2015-06-02 DIAGNOSIS — D693 Immune thrombocytopenic purpura: Secondary | ICD-10-CM

## 2015-06-02 DIAGNOSIS — D72819 Decreased white blood cell count, unspecified: Secondary | ICD-10-CM

## 2015-06-02 DIAGNOSIS — D72821 Monocytosis (symptomatic): Secondary | ICD-10-CM | POA: Diagnosis not present

## 2015-06-02 LAB — CBC WITH DIFFERENTIAL/PLATELET
BASOS ABS: 0 10*3/uL (ref 0.0–0.1)
Basophils Relative: 1 % (ref 0–1)
EOS PCT: 1 % (ref 0–5)
Eosinophils Absolute: 0 10*3/uL (ref 0.0–0.7)
HCT: 43.7 % (ref 39.0–52.0)
Hemoglobin: 14.6 g/dL (ref 13.0–17.0)
Lymphocytes Relative: 31 % (ref 12–46)
Lymphs Abs: 0.7 10*3/uL (ref 0.7–4.0)
MCH: 30.5 pg (ref 26.0–34.0)
MCHC: 33.4 g/dL (ref 30.0–36.0)
MCV: 91.4 fL (ref 78.0–100.0)
MONO ABS: 0.7 10*3/uL (ref 0.1–1.0)
MONOS PCT: 33 % — AB (ref 3–12)
Neutro Abs: 0.7 10*3/uL — ABNORMAL LOW (ref 1.7–7.7)
Neutrophils Relative %: 34 % — ABNORMAL LOW (ref 43–77)
Platelets: 70 10*3/uL — ABNORMAL LOW (ref 150–400)
RBC: 4.78 MIL/uL (ref 4.22–5.81)
RDW: 12.8 % (ref 11.5–15.5)
WBC: 2.2 10*3/uL — ABNORMAL LOW (ref 4.0–10.5)

## 2015-06-02 LAB — COMPREHENSIVE METABOLIC PANEL
ALK PHOS: 36 U/L — AB (ref 39–117)
ALT: 24 U/L (ref 0–53)
AST: 20 U/L (ref 0–37)
Albumin: 3.9 g/dL (ref 3.5–5.2)
BILIRUBIN TOTAL: 0.7 mg/dL (ref 0.2–1.2)
BUN: 12 mg/dL (ref 6–23)
CO2: 28 mEq/L (ref 19–32)
Calcium: 9.1 mg/dL (ref 8.4–10.5)
Chloride: 106 mEq/L (ref 96–112)
Creat: 0.98 mg/dL (ref 0.50–1.35)
Glucose, Bld: 99 mg/dL (ref 70–99)
Potassium: 4.7 mEq/L (ref 3.5–5.3)
SODIUM: 142 meq/L (ref 135–145)
Total Protein: 6.5 g/dL (ref 6.0–8.3)

## 2015-06-02 LAB — SAVE SMEAR

## 2015-06-03 ENCOUNTER — Telehealth: Payer: Self-pay | Admitting: *Deleted

## 2015-06-03 NOTE — Telephone Encounter (Signed)
-----   Message from Annia Belt, MD sent at 06/03/2015  7:25 AM EDT ----- Call pt: counts at his baseline: platelets 70,000; white count 2,200

## 2015-06-03 NOTE — Telephone Encounter (Signed)
Pt called / informed counts at his baseline - platelets @ 70,000 and WBC @ 2,200 per Dr Beryle Beams. No questions.

## 2015-08-07 DIAGNOSIS — L918 Other hypertrophic disorders of the skin: Secondary | ICD-10-CM | POA: Diagnosis not present

## 2015-08-07 DIAGNOSIS — Z23 Encounter for immunization: Secondary | ICD-10-CM | POA: Diagnosis not present

## 2015-09-03 ENCOUNTER — Other Ambulatory Visit: Payer: Commercial Managed Care - HMO

## 2015-09-03 ENCOUNTER — Telehealth: Payer: Self-pay | Admitting: *Deleted

## 2015-09-03 NOTE — Telephone Encounter (Signed)
Call from pt - he needs to re-scheduled his appt with Dr Beryle Beams on 9/27 @ 1015AM.  Message sent to Susette Racer.

## 2015-09-05 ENCOUNTER — Other Ambulatory Visit (INDEPENDENT_AMBULATORY_CARE_PROVIDER_SITE_OTHER): Payer: Commercial Managed Care - HMO

## 2015-09-05 DIAGNOSIS — D693 Immune thrombocytopenic purpura: Secondary | ICD-10-CM

## 2015-09-05 DIAGNOSIS — D72819 Decreased white blood cell count, unspecified: Secondary | ICD-10-CM

## 2015-09-05 DIAGNOSIS — D72821 Monocytosis (symptomatic): Secondary | ICD-10-CM

## 2015-09-05 LAB — SAVE SMEAR

## 2015-09-06 LAB — CBC WITH DIFFERENTIAL/PLATELET
Basophils Absolute: 0 10*3/uL (ref 0.0–0.2)
Basos: 0 %
EOS (ABSOLUTE): 0 10*3/uL (ref 0.0–0.4)
Eos: 1 %
HEMOGLOBIN: 13.8 g/dL (ref 12.6–17.7)
Hematocrit: 41.6 % (ref 37.5–51.0)
IMMATURE GRANS (ABS): 0 10*3/uL (ref 0.0–0.1)
IMMATURE GRANULOCYTES: 2 %
LYMPHS: 39 %
Lymphocytes Absolute: 1 10*3/uL (ref 0.7–3.1)
MCH: 30.3 pg (ref 26.6–33.0)
MCHC: 33.2 g/dL (ref 31.5–35.7)
MCV: 91 fL (ref 79–97)
Monocytes Absolute: 0.9 10*3/uL (ref 0.1–0.9)
Monocytes: 33 %
NEUTROS PCT: 26 %
NRBC: 0 % (ref 0–0)
Neutrophils Absolute: 0.7 10*3/uL — ABNORMAL LOW (ref 1.4–7.0)
Platelets: 76 10*3/uL — CL (ref 150–379)
RBC: 4.55 x10E6/uL (ref 4.14–5.80)
RDW: 13.6 % (ref 12.3–15.4)
WBC: 2.6 10*3/uL — ABNORMAL LOW (ref 3.4–10.8)

## 2015-09-06 LAB — CMP14 + ANION GAP
ALT: 14 IU/L (ref 0–44)
AST: 16 IU/L (ref 0–40)
Albumin/Globulin Ratio: 1.6 (ref 1.1–2.5)
Albumin: 3.9 g/dL (ref 3.5–4.8)
Alkaline Phosphatase: 46 IU/L (ref 39–117)
Anion Gap: 16 mmol/L (ref 10.0–18.0)
BUN/Creatinine Ratio: 13 (ref 10–22)
BUN: 11 mg/dL (ref 8–27)
Bilirubin Total: 0.5 mg/dL (ref 0.0–1.2)
CO2: 25 mmol/L (ref 18–29)
Calcium: 8.9 mg/dL (ref 8.6–10.2)
Chloride: 103 mmol/L (ref 97–108)
Creatinine, Ser: 0.84 mg/dL (ref 0.76–1.27)
GFR calc Af Amer: 101 mL/min/{1.73_m2} (ref >59)
GFR calc non Af Amer: 87 mL/min/{1.73_m2} (ref >59)
GLUCOSE: 91 mg/dL (ref 65–99)
Globulin, Total: 2.5 g/dL (ref 1.5–4.5)
Potassium: 4.2 mmol/L (ref 3.5–5.2)
Sodium: 144 mmol/L (ref 134–144)
TOTAL PROTEIN: 6.4 g/dL (ref 6.0–8.5)

## 2015-09-08 ENCOUNTER — Telehealth: Payer: Self-pay | Admitting: *Deleted

## 2015-09-08 NOTE — Telephone Encounter (Signed)
Pt called / informed counts are stable at his baseline per Dr Beryle Beams; platelets are 76. Voiced no questions.

## 2015-09-08 NOTE — Telephone Encounter (Signed)
-----   Message from Annia Belt, MD sent at 09/07/2015  8:35 AM EDT ----- Call pt: counts stable at his baseline

## 2015-09-16 ENCOUNTER — Ambulatory Visit: Payer: Commercial Managed Care - HMO | Admitting: Oncology

## 2015-09-23 ENCOUNTER — Encounter: Payer: Self-pay | Admitting: Oncology

## 2015-09-23 ENCOUNTER — Ambulatory Visit (INDEPENDENT_AMBULATORY_CARE_PROVIDER_SITE_OTHER): Payer: Commercial Managed Care - HMO | Admitting: Oncology

## 2015-09-23 VITALS — BP 152/78 | HR 65 | Temp 98.1°F | Ht 71.0 in | Wt 202.6 lb

## 2015-09-23 DIAGNOSIS — D72821 Monocytosis (symptomatic): Secondary | ICD-10-CM

## 2015-09-23 DIAGNOSIS — Z9889 Other specified postprocedural states: Secondary | ICD-10-CM

## 2015-09-23 DIAGNOSIS — D72819 Decreased white blood cell count, unspecified: Secondary | ICD-10-CM

## 2015-09-23 DIAGNOSIS — D693 Immune thrombocytopenic purpura: Secondary | ICD-10-CM

## 2015-09-23 DIAGNOSIS — L98499 Non-pressure chronic ulcer of skin of other sites with unspecified severity: Secondary | ICD-10-CM | POA: Diagnosis not present

## 2015-09-23 MED ORDER — CEPHALEXIN 500 MG PO CAPS: 500.0000 mg | ORAL_CAPSULE | Freq: Three times a day (TID) | ORAL | Status: AC

## 2015-09-23 NOTE — Progress Notes (Signed)
Patient ID: Billy [name], male   DOB: 16-Nov-1941, 74 y.o.   MRN: [id number] Hematology and Oncology Follow Up Visit  Billy [name] [id number] 1941/05/15 73 y.o. 09/23/2015 6:53 PM   Principle Diagnosis: Encounter Diagnoses  Name Primary?  . Chronic ITP (idiopathic thrombocytopenia) (HCC) Yes  . Leukopenia   . 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 He had mild thrombocytopenia documented as early as March 2007 when platelet count was 123,000. A CBC done in February 2009 showed platelets down to 42,000.  He was treated with observation alone until November 2009 when platelet count drifted down to 25,000. He had a minimal response to steroids and no improvement with addition of danazol added in January 2010. Due to the development of some atypical features with leukopenia and monocytosis I did a bone marrow aspiration and biopsy on 06/12/2009 to exclude additional pathology. Bone marrow and cytogenetic studies on the marrow were normal. No excess blasts. No excess plasma cells.  He was given a trial of Rituxan antibody between 07/01/2009 and 07/22/2009. He had no response. He was started on weekly Nplate injections on 72/62/0355 which was successful in maintaining his platelet counts above 50,000 with peak counts as high as 144,000. He stayed on the drug for 2-1/2 years.  He was transitioned to the oral thrombopoietin receptor agonist, Promacta (eltrombopag) which was started in March 2013. He has tolerated this product well and we have been able to maintain his platelet count at or above 50,000. I have been monitoring his liver functions periodically and they have remained normal.   Interim History:   He had a number of skin tags removed from the medial left thigh about 7 weeks ago. One large area was scraped off. This has developed into a nonhealing ulcer. He reports increasing fatigue.   Medications:  reviewed  Allergies:  Allergies  Allergen Reactions  . Penicillins Other (See Comments)    Doesn't remember was infant    Review of Systems: See history of present illness Remaining ROS negative:   Physical Exam: Blood pressure 152/78, pulse 65, temperature 98.1 F (36.7 C), temperature source Oral, height 5\' 11"  (1.803 m), weight 202 lb 9.6 oz (91.899 kg), SpO2 98 %. Wt Readings from Last 3 Encounters:  09/23/15 202 lb 9.6 oz (91.899 kg)  03/03/15 207 lb 9.6 oz (94.167 kg)  09/03/14 208 lb 9.6 oz (94.62 kg)     General appearance: well-nourished Caucasian man [name]: Pharynx no erythema, exudate, mass, or ulcer. No thyromegaly or thyroid nodules Lymph nodes: No cervical, supraclavicular, or axillary lymphadenopathy Breasts: 5QLungs: Clear to auscultation, resonant to percussion throughout Heart: Regular rhythm, no murmur, no gallop, no rub, no click, no edema Abdomen: Soft, nontender, normal bowel sounds, no mass, no organomegaly Extremities: No edema, no calf tenderness Musculoskeletal: no joint deformities GU: 1 cm ulcerated lesion medial proximal left thigh adjacent to the scrotum with a purulent exudate and nonhealing borders Vascular: Carotid pulses 2+, no bruits, Neurologic: Alert, oriented, PERRLA, cranial nerves grossly normal, motor strength 5 over 5, reflexes 1+ symmetric, upper body coordination normal, gait normal, Skin: No rash or ecchymosis  Lab Results: CBC W/Diff    Component Value Date/Time   WBC 2.6* 09/05/2015 1130   WBC 2.2* 06/02/2015 1155   WBC 2.9* 02/08/2014 1016   RBC 4.55 09/05/2015 1130   RBC 4.78 06/02/2015 1155   RBC 5.00 02/08/2014 1016   HGB 14.6 06/02/2015 1155  HGB 14.7 02/08/2014 1016   HCT 41.6 09/05/2015 1130   HCT 43.7 06/02/2015 1155   HCT 45.0 02/08/2014 1016   PLT 70* 06/02/2015 1155   PLT 69* 02/08/2014 1016   MCV 91.4 06/02/2015 1155   MCV 90.1 02/08/2014 1016   MCH 30.3 09/05/2015 1130   MCH 30.5 06/02/2015 1155   MCH  29.5 02/08/2014 1016   MCHC 33.2 09/05/2015 1130   MCHC 33.4 06/02/2015 1155   MCHC 32.7 02/08/2014 1016   RDW 13.6 09/05/2015 1130   RDW 12.8 06/02/2015 1155   RDW 12.2 02/08/2014 1016   LYMPHSABS 1.0 09/05/2015 1130   LYMPHSABS 0.7 06/02/2015 1155   LYMPHSABS 1.1 02/08/2014 1016   MONOABS 0.7 06/02/2015 1155   MONOABS 0.8 02/08/2014 1016   EOSABS 0.0 06/02/2015 1155   EOSABS 0.0 02/08/2014 1016   BASOSABS 0.0 09/05/2015 1130   BASOSABS 0.0 06/02/2015 1155   BASOSABS 0.0 02/08/2014 1016     Chemistry      Component Value Date/Time   NA 144 09/05/2015 1130   NA 142 06/02/2015 1155   NA 142 02/08/2014 1016   K 4.2 09/05/2015 1130   K 4.0 02/08/2014 1016   CL 103 09/05/2015 1130   CL 106 05/31/2013 0920   CO2 25 09/05/2015 1130   CO2 26 02/08/2014 1016   BUN 11 09/05/2015 1130   BUN 12 06/02/2015 1155   BUN 13.0 02/08/2014 1016   CREATININE 0.84 09/05/2015 1130   CREATININE 0.98 06/02/2015 1155   CREATININE 0.9 02/08/2014 1016      Component Value Date/Time   CALCIUM 8.9 09/05/2015 1130   CALCIUM 9.4 02/08/2014 1016   ALKPHOS 46 09/05/2015 1130   ALKPHOS 46 02/08/2014 1016   AST 16 09/05/2015 1130   AST 24 02/08/2014 1016   ALT 14 09/05/2015 1130   ALT 23 02/08/2014 1016   BILITOT 0.5 09/05/2015 1130   BILITOT 0.7 06/02/2015 1155   BILITOT 0.58 02/08/2014 1016       Radiological Studies: No results found.  Impression:  #1. Chronic ITP stable with a good partial response on Promacta. Plan: he was just notified that his copayment will go up 100%. He is already spending about $6-$7000 a year for this drug. He cannot afford to continue it indefinitely. He requests that we go back on the Romiplostim weekly injections. He will finish his Promacta through the end of this year then beginning on January 2 resumed the Romiplostim at previous effective dose.I don't know what is going on in the pharmaceutical industry but this is one of many drugs that will be doubling in  Price.  #2. Chronic neutropenia with monocytosis. Suspect this is also on an immune basis.  #3. Essential hypertension  #4. Nonhealing ulcer medial thigh status post scrape excision of a skin tag. I'm going to put him on a 10 day course of Keflex 500 mg 3 times daily. He has a weak in his cell and allergy history and was told he was allergic to penicillin as an infant. We discussed today that this was likely a rash. Keflex is short acting and if he has a reaction he will be out of his system quickly after stopping it.if the ulcer does not heal over the next 10 days of black and to see a surgeon for debridement.  CC: Patient Care Team: Antony Contras, MD as PCP - General (Family Medicine) Annia Belt, MD as Consulting Physician (Hematology and Oncology)   Annia Belt, MD  10/4/20166:53 PM

## 2015-09-23 NOTE — Patient Instructions (Signed)
Continue every 3 month labs at [facility] We will schedule N-Plate injections at the cancer center to begin December 22, 2015; continue Promacta until then MD vist with Dr Darnell Level 6 months

## 2015-10-17 ENCOUNTER — Other Ambulatory Visit: Payer: Self-pay | Admitting: Oncology

## 2015-10-20 ENCOUNTER — Other Ambulatory Visit: Payer: Self-pay | Admitting: Oncology

## 2015-10-20 MED ORDER — ELTROMBOPAG OLAMINE 50 MG PO TABS: ORAL_TABLET | ORAL | Status: DC

## 2015-10-20 NOTE — Telephone Encounter (Signed)
[facility] called about med - talked with Dr Beryle Beams states pt will restart med in Jan 2017. Dr Beryle Beams has a note to order med for 12/2015 as it gets closer to that time. Hilda Blades Symia Herdt RN 10/20/15 4:30PM

## 2015-11-25 ENCOUNTER — Telehealth: Payer: Self-pay | Admitting: *Deleted

## 2015-11-25 NOTE — Telephone Encounter (Signed)
Returned pt's call - stated he had talked to Dr Beryle Beams in Oct about the cost of Promacta increasing to 6800.00+ and going back to injections at the [facility] beginning of January. And what's needs to be done; any pre-authorization. I told him , I need to talk to Dr Darnell Level when he returns.

## 2015-12-02 ENCOUNTER — Other Ambulatory Visit: Payer: Self-pay | Admitting: Oncology

## 2015-12-02 DIAGNOSIS — D693 Immune thrombocytopenic purpura: Secondary | ICD-10-CM

## 2015-12-03 ENCOUNTER — Other Ambulatory Visit: Payer: Commercial Managed Care - HMO

## 2015-12-04 ENCOUNTER — Telehealth: Payer: Self-pay | Admitting: Oncology

## 2015-12-04 NOTE — Telephone Encounter (Signed)
Called and left a message with upcoming lab and inj appointments

## 2015-12-05 ENCOUNTER — Other Ambulatory Visit: Payer: Commercial Managed Care - HMO

## 2015-12-05 NOTE — Telephone Encounter (Signed)
Talked to Mannie Stabile - stated she will look into the pre-authorization. Pt has [employer]. First appt is 12/23/15.

## 2015-12-05 NOTE — Telephone Encounter (Signed)
OK thanks  I already put in orders at the cancer center for him to start Tues, 12/23/15. Pre-authorization important piece of the pie

## 2015-12-16 ENCOUNTER — Other Ambulatory Visit: Payer: Self-pay | Admitting: Oncology

## 2015-12-16 DIAGNOSIS — J209 Acute bronchitis, unspecified: Secondary | ICD-10-CM | POA: Diagnosis not present

## 2015-12-23 ENCOUNTER — Ambulatory Visit (HOSPITAL_BASED_OUTPATIENT_CLINIC_OR_DEPARTMENT_OTHER): Payer: Commercial Managed Care - HMO

## 2015-12-23 ENCOUNTER — Other Ambulatory Visit (HOSPITAL_BASED_OUTPATIENT_CLINIC_OR_DEPARTMENT_OTHER): Payer: Commercial Managed Care - HMO

## 2015-12-23 VITALS — BP 150/55 | HR 67 | Temp 97.9°F

## 2015-12-23 DIAGNOSIS — D693 Immune thrombocytopenic purpura: Secondary | ICD-10-CM

## 2015-12-23 LAB — CBC WITH DIFFERENTIAL/PLATELET
BASO%: 0.9 % (ref 0.0–2.0)
Basophils Absolute: 0 10*3/uL (ref 0.0–0.1)
EOS%: 0.5 % (ref 0.0–7.0)
Eosinophils Absolute: 0 10*3/uL (ref 0.0–0.5)
HCT: 39.8 % (ref 38.4–49.9)
HGB: 12.9 g/dL — ABNORMAL LOW (ref 13.0–17.1)
LYMPH%: 21.7 % (ref 14.0–49.0)
MCH: 29.5 pg (ref 27.2–33.4)
MCHC: 32.3 g/dL (ref 32.0–36.0)
MCV: 91.1 fL (ref 79.3–98.0)
MONO#: 1.2 10*3/uL — AB (ref 0.1–0.9)
MONO%: 28.7 % — ABNORMAL HIGH (ref 0.0–14.0)
NEUT%: 48.2 % (ref 39.0–75.0)
NEUTROS ABS: 2 10*3/uL (ref 1.5–6.5)
RBC: 4.37 10*6/uL (ref 4.20–5.82)
RDW: 13.3 % (ref 11.0–14.6)
WBC: 4.1 10*3/uL (ref 4.0–10.3)
lymph#: 0.9 10*3/uL (ref 0.9–3.3)

## 2015-12-23 MED ORDER — ROMIPLOSTIM 250 MCG ~~LOC~~ SOLR
90.0000 ug | Freq: Once | SUBCUTANEOUS | Status: AC
  Administered 2015-12-23: 90 ug via SUBCUTANEOUS
  Filled 2015-12-23: qty 0.18

## 2015-12-24 ENCOUNTER — Telehealth: Payer: Self-pay | Admitting: *Deleted

## 2015-12-24 NOTE — Telephone Encounter (Signed)
Pt called; no answer - left message platelets good at 99,000 per Dr Beryle Beams. And to call if he has any questions.

## 2015-12-24 NOTE — Telephone Encounter (Signed)
-----   Message from Annia Belt, MD sent at 12/23/2015  4:21 PM EST ----- Call pt platelets good at 99,000

## 2015-12-30 ENCOUNTER — Ambulatory Visit (HOSPITAL_BASED_OUTPATIENT_CLINIC_OR_DEPARTMENT_OTHER): Payer: Commercial Managed Care - HMO

## 2015-12-30 ENCOUNTER — Other Ambulatory Visit (HOSPITAL_BASED_OUTPATIENT_CLINIC_OR_DEPARTMENT_OTHER): Payer: Commercial Managed Care - HMO

## 2015-12-30 VITALS — BP 149/61 | HR 69 | Temp 98.2°F

## 2015-12-30 DIAGNOSIS — D693 Immune thrombocytopenic purpura: Secondary | ICD-10-CM | POA: Diagnosis not present

## 2015-12-30 DIAGNOSIS — D72821 Monocytosis (symptomatic): Secondary | ICD-10-CM

## 2015-12-30 DIAGNOSIS — D72819 Decreased white blood cell count, unspecified: Secondary | ICD-10-CM

## 2015-12-30 LAB — CBC WITH DIFFERENTIAL/PLATELET
BASO%: 1 % (ref 0.0–2.0)
Basophils Absolute: 0 10*3/uL (ref 0.0–0.1)
EOS%: 0.7 % (ref 0.0–7.0)
Eosinophils Absolute: 0 10*3/uL (ref 0.0–0.5)
HCT: 38.6 % (ref 38.4–49.9)
HEMOGLOBIN: 12.8 g/dL — AB (ref 13.0–17.1)
LYMPH#: 1 10*3/uL (ref 0.9–3.3)
LYMPH%: 31.4 % (ref 14.0–49.0)
MCH: 30.1 pg (ref 27.2–33.4)
MCHC: 33.2 g/dL (ref 32.0–36.0)
MCV: 90.8 fL (ref 79.3–98.0)
MONO#: 0.7 10*3/uL (ref 0.1–0.9)
MONO%: 23.5 % — ABNORMAL HIGH (ref 0.0–14.0)
NEUT#: 1.3 10*3/uL — ABNORMAL LOW (ref 1.5–6.5)
NEUT%: 43.4 % (ref 39.0–75.0)
Platelets: 87 10*3/uL — ABNORMAL LOW (ref 140–400)
RBC: 4.25 10*6/uL (ref 4.20–5.82)
RDW: 12.9 % (ref 11.0–14.6)
WBC: 3.1 10*3/uL — ABNORMAL LOW (ref 4.0–10.3)
nRBC: 1 % — ABNORMAL HIGH (ref 0–0)

## 2015-12-30 LAB — COMPREHENSIVE METABOLIC PANEL
ALBUMIN: 3.4 g/dL — AB (ref 3.5–5.0)
ALT: 27 U/L (ref 0–55)
AST: 22 U/L (ref 5–34)
Alkaline Phosphatase: 53 U/L (ref 40–150)
Anion Gap: 7 mEq/L (ref 3–11)
BUN: 10.9 mg/dL (ref 7.0–26.0)
CHLORIDE: 106 meq/L (ref 98–109)
CO2: 28 meq/L (ref 22–29)
Calcium: 9.2 mg/dL (ref 8.4–10.4)
Creatinine: 1 mg/dL (ref 0.7–1.3)
EGFR: 71 mL/min/{1.73_m2} — ABNORMAL LOW (ref >90)
GLUCOSE: 129 mg/dL (ref 70–140)
POTASSIUM: 4 meq/L (ref 3.5–5.1)
SODIUM: 142 meq/L (ref 136–145)
Total Bilirubin: 0.33 mg/dL (ref 0.20–1.20)
Total Protein: 7 g/dL (ref 6.4–8.3)

## 2015-12-30 LAB — TECHNOLOGIST REVIEW

## 2015-12-30 MED ORDER — ROMIPLOSTIM 250 MCG ~~LOC~~ SOLR
90.0000 ug | Freq: Once | SUBCUTANEOUS | Status: AC
  Administered 2015-12-30: 90 ug via SUBCUTANEOUS
  Filled 2015-12-30: qty 0.18

## 2016-01-06 ENCOUNTER — Other Ambulatory Visit (HOSPITAL_BASED_OUTPATIENT_CLINIC_OR_DEPARTMENT_OTHER): Payer: Commercial Managed Care - HMO

## 2016-01-06 ENCOUNTER — Ambulatory Visit (HOSPITAL_BASED_OUTPATIENT_CLINIC_OR_DEPARTMENT_OTHER): Payer: Commercial Managed Care - HMO

## 2016-01-06 VITALS — BP 150/70 | HR 61 | Temp 98.2°F

## 2016-01-06 DIAGNOSIS — D72819 Decreased white blood cell count, unspecified: Secondary | ICD-10-CM

## 2016-01-06 DIAGNOSIS — D693 Immune thrombocytopenic purpura: Secondary | ICD-10-CM

## 2016-01-06 DIAGNOSIS — D72821 Monocytosis (symptomatic): Secondary | ICD-10-CM

## 2016-01-06 LAB — CBC WITH DIFFERENTIAL/PLATELET
BASO%: 0.4 % (ref 0.0–2.0)
BASOS ABS: 0 10*3/uL (ref 0.0–0.1)
EOS%: 0.7 % (ref 0.0–7.0)
Eosinophils Absolute: 0 10*3/uL (ref 0.0–0.5)
HCT: 37.6 % — ABNORMAL LOW (ref 38.4–49.9)
HGB: 12.6 g/dL — ABNORMAL LOW (ref 13.0–17.1)
LYMPH#: 0.7 10*3/uL — AB (ref 0.9–3.3)
LYMPH%: 25.2 % (ref 14.0–49.0)
MCH: 30.1 pg (ref 27.2–33.4)
MCHC: 33.5 g/dL (ref 32.0–36.0)
MCV: 90 fL (ref 79.3–98.0)
MONO#: 1.1 10*3/uL — AB (ref 0.1–0.9)
MONO%: 37.2 % — ABNORMAL HIGH (ref 0.0–14.0)
NEUT#: 1 10*3/uL — ABNORMAL LOW (ref 1.5–6.5)
NEUT%: 36.5 % — ABNORMAL LOW (ref 39.0–75.0)
NRBC: 0 % (ref 0–0)
Platelets: 62 10*3/uL — ABNORMAL LOW (ref 140–400)
RBC: 4.18 10*6/uL — AB (ref 4.20–5.82)
RDW: 13.4 % (ref 11.0–14.6)
WBC: 2.8 10*3/uL — ABNORMAL LOW (ref 4.0–10.3)

## 2016-01-06 LAB — TECHNOLOGIST REVIEW

## 2016-01-06 MED ORDER — ROMIPLOSTIM 250 MCG ~~LOC~~ SOLR
90.0000 ug | Freq: Once | SUBCUTANEOUS | Status: AC
  Administered 2016-01-06: 90 ug via SUBCUTANEOUS
  Filled 2016-01-06: qty 0.18

## 2016-01-07 ENCOUNTER — Telehealth: Payer: Self-pay | Admitting: *Deleted

## 2016-01-07 NOTE — Telephone Encounter (Signed)
-----   Message from Annia Belt, MD sent at 01/06/2016  1:07 PM EST ----- Call pt: platelet count 62,000 back on N-plate & this is acceptable

## 2016-01-07 NOTE — Telephone Encounter (Signed)
Pt called / informed  "platelet count 62,000 back on N-plate and this is acceptable" per Dr Beryle Beams.  Voiced no questions.

## 2016-01-13 ENCOUNTER — Telehealth: Payer: Self-pay | Admitting: *Deleted

## 2016-01-13 ENCOUNTER — Ambulatory Visit (HOSPITAL_BASED_OUTPATIENT_CLINIC_OR_DEPARTMENT_OTHER): Payer: Commercial Managed Care - HMO

## 2016-01-13 ENCOUNTER — Other Ambulatory Visit (HOSPITAL_BASED_OUTPATIENT_CLINIC_OR_DEPARTMENT_OTHER): Payer: Commercial Managed Care - HMO

## 2016-01-13 VITALS — BP 153/69 | HR 64 | Temp 98.5°F

## 2016-01-13 DIAGNOSIS — D693 Immune thrombocytopenic purpura: Secondary | ICD-10-CM | POA: Diagnosis not present

## 2016-01-13 LAB — CBC WITH DIFFERENTIAL/PLATELET
BASO%: 0.3 % (ref 0.0–2.0)
Basophils Absolute: 0 10*3/uL (ref 0.0–0.1)
EOS ABS: 0 10*3/uL (ref 0.0–0.5)
EOS%: 0.6 % (ref 0.0–7.0)
HCT: 39.9 % (ref 38.4–49.9)
HGB: 13.3 g/dL (ref 13.0–17.1)
LYMPH%: 27.8 % (ref 14.0–49.0)
MCH: 30.2 pg (ref 27.2–33.4)
MCHC: 33.3 g/dL (ref 32.0–36.0)
MCV: 90.7 fL (ref 79.3–98.0)
MONO#: 1.2 10*3/uL — ABNORMAL HIGH (ref 0.1–0.9)
MONO%: 36 % — AB (ref 0.0–14.0)
NEUT%: 35.3 % — ABNORMAL LOW (ref 39.0–75.0)
NEUTROS ABS: 1.2 10*3/uL — AB (ref 1.5–6.5)
PLATELETS: 52 10*3/uL — AB (ref 140–400)
RBC: 4.4 10*6/uL (ref 4.20–5.82)
RDW: 13.6 % (ref 11.0–14.6)
WBC: 3.4 10*3/uL — AB (ref 4.0–10.3)
lymph#: 1 10*3/uL (ref 0.9–3.3)
nRBC: 0 % (ref 0–0)

## 2016-01-13 MED ORDER — ROMIPLOSTIM 250 MCG ~~LOC~~ SOLR
90.0000 ug | Freq: Once | SUBCUTANEOUS | Status: AC
  Administered 2016-01-13: 90 ug via SUBCUTANEOUS
  Filled 2016-01-13: qty 0.18

## 2016-01-13 NOTE — Telephone Encounter (Signed)
Called [place] about missed appointment this morning.  He forgot,  Will come this afternoon.  Patient aware of new time.

## 2016-01-15 ENCOUNTER — Telehealth: Payer: Self-pay | Admitting: *Deleted

## 2016-01-15 NOTE — Telephone Encounter (Signed)
-----   Message from Billy Belt, MD sent at 01/13/2016  2:52 PM EST ----- Call pt: platelet count OK @ 52,000

## 2016-01-15 NOTE — Telephone Encounter (Signed)
Pt called - no answer; left message platelet count  OK @ 52,000 per Dr Beryle Beams. And to call if he has any questions.

## 2016-01-20 ENCOUNTER — Ambulatory Visit (HOSPITAL_BASED_OUTPATIENT_CLINIC_OR_DEPARTMENT_OTHER): Payer: Commercial Managed Care - HMO

## 2016-01-20 ENCOUNTER — Other Ambulatory Visit (HOSPITAL_BASED_OUTPATIENT_CLINIC_OR_DEPARTMENT_OTHER): Payer: Commercial Managed Care - HMO

## 2016-01-20 ENCOUNTER — Other Ambulatory Visit: Payer: Self-pay | Admitting: Oncology

## 2016-01-20 VITALS — BP 135/76 | HR 58 | Temp 98.3°F

## 2016-01-20 DIAGNOSIS — D693 Immune thrombocytopenic purpura: Secondary | ICD-10-CM | POA: Diagnosis not present

## 2016-01-20 LAB — CBC WITH DIFFERENTIAL/PLATELET
BASO%: 0.8 % (ref 0.0–2.0)
BASOS ABS: 0 10*3/uL (ref 0.0–0.1)
EOS%: 0.8 % (ref 0.0–7.0)
Eosinophils Absolute: 0 10*3/uL (ref 0.0–0.5)
HEMATOCRIT: 40.4 % (ref 38.4–49.9)
HEMOGLOBIN: 13.5 g/dL (ref 13.0–17.1)
LYMPH#: 0.8 10*3/uL — AB (ref 0.9–3.3)
LYMPH%: 29.9 % (ref 14.0–49.0)
MCH: 30.4 pg (ref 27.2–33.4)
MCHC: 33.4 g/dL (ref 32.0–36.0)
MCV: 91 fL (ref 79.3–98.0)
MONO#: 1.2 10*3/uL — AB (ref 0.1–0.9)
MONO%: 46.9 % — ABNORMAL HIGH (ref 0.0–14.0)
NEUT#: 0.6 10*3/uL — ABNORMAL LOW (ref 1.5–6.5)
NEUT%: 21.6 % — AB (ref 39.0–75.0)
Platelets: 50 10*3/uL — ABNORMAL LOW (ref 140–400)
RBC: 4.44 10*6/uL (ref 4.20–5.82)
RDW: 13.7 % (ref 11.0–14.6)
WBC: 2.5 10*3/uL — ABNORMAL LOW (ref 4.0–10.3)
nRBC: 0 % (ref 0–0)

## 2016-01-20 MED ORDER — ROMIPLOSTIM 250 MCG ~~LOC~~ SOLR
90.0000 ug | Freq: Once | SUBCUTANEOUS | Status: AC
Start: 2016-01-20 — End: 2016-01-20
  Administered 2016-01-20: 90 ug via SUBCUTANEOUS
  Filled 2016-01-20: qty 0.18

## 2016-01-21 DIAGNOSIS — Z1389 Encounter for screening for other disorder: Secondary | ICD-10-CM | POA: Diagnosis not present

## 2016-01-21 DIAGNOSIS — D72819 Decreased white blood cell count, unspecified: Secondary | ICD-10-CM | POA: Diagnosis not present

## 2016-01-21 DIAGNOSIS — I10 Essential (primary) hypertension: Secondary | ICD-10-CM | POA: Diagnosis not present

## 2016-01-21 DIAGNOSIS — D693 Immune thrombocytopenic purpura: Secondary | ICD-10-CM | POA: Diagnosis not present

## 2016-01-21 DIAGNOSIS — Z125 Encounter for screening for malignant neoplasm of prostate: Secondary | ICD-10-CM | POA: Diagnosis not present

## 2016-01-21 DIAGNOSIS — B356 Tinea cruris: Secondary | ICD-10-CM | POA: Diagnosis not present

## 2016-01-21 DIAGNOSIS — Z0000 Encounter for general adult medical examination without abnormal findings: Secondary | ICD-10-CM | POA: Diagnosis not present

## 2016-01-27 ENCOUNTER — Other Ambulatory Visit (HOSPITAL_BASED_OUTPATIENT_CLINIC_OR_DEPARTMENT_OTHER): Payer: Commercial Managed Care - HMO

## 2016-01-27 ENCOUNTER — Ambulatory Visit (HOSPITAL_BASED_OUTPATIENT_CLINIC_OR_DEPARTMENT_OTHER): Payer: Commercial Managed Care - HMO

## 2016-01-27 VITALS — BP 149/67 | HR 65 | Temp 98.2°F

## 2016-01-27 DIAGNOSIS — D693 Immune thrombocytopenic purpura: Secondary | ICD-10-CM

## 2016-01-27 LAB — CBC WITH DIFFERENTIAL/PLATELET
BASO%: 0.3 % (ref 0.0–2.0)
Basophils Absolute: 0 10*3/uL (ref 0.0–0.1)
EOS ABS: 0 10*3/uL (ref 0.0–0.5)
EOS%: 0.9 % (ref 0.0–7.0)
HEMATOCRIT: 39.4 % (ref 38.4–49.9)
HEMOGLOBIN: 13.1 g/dL (ref 13.0–17.1)
LYMPH%: 26.2 % (ref 14.0–49.0)
MCH: 30.6 pg (ref 27.2–33.4)
MCHC: 33.2 g/dL (ref 32.0–36.0)
MCV: 92.1 fL (ref 79.3–98.0)
MONO#: 1.3 10*3/uL — ABNORMAL HIGH (ref 0.1–0.9)
MONO%: 40.7 % — ABNORMAL HIGH (ref 0.0–14.0)
NEUT%: 31.9 % — AB (ref 39.0–75.0)
NEUTROS ABS: 1 10*3/uL — AB (ref 1.5–6.5)
PLATELETS: 47 10*3/uL — AB (ref 140–400)
RBC: 4.28 10*6/uL (ref 4.20–5.82)
RDW: 13.6 % (ref 11.0–14.6)
WBC: 3.2 10*3/uL — ABNORMAL LOW (ref 4.0–10.3)
lymph#: 0.9 10*3/uL (ref 0.9–3.3)
nRBC: 0 % (ref 0–0)

## 2016-01-27 MED ORDER — ROMIPLOSTIM 250 MCG ~~LOC~~ SOLR
90.0000 ug | Freq: Once | SUBCUTANEOUS | Status: AC
  Administered 2016-01-27: 90 ug via SUBCUTANEOUS
  Filled 2016-01-27: qty 0.18

## 2016-02-03 ENCOUNTER — Ambulatory Visit (HOSPITAL_BASED_OUTPATIENT_CLINIC_OR_DEPARTMENT_OTHER): Payer: Commercial Managed Care - HMO

## 2016-02-03 ENCOUNTER — Other Ambulatory Visit (HOSPITAL_BASED_OUTPATIENT_CLINIC_OR_DEPARTMENT_OTHER): Payer: Commercial Managed Care - HMO

## 2016-02-03 VITALS — BP 158/83 | HR 67 | Temp 98.5°F

## 2016-02-03 DIAGNOSIS — D72821 Monocytosis (symptomatic): Secondary | ICD-10-CM

## 2016-02-03 DIAGNOSIS — D72819 Decreased white blood cell count, unspecified: Secondary | ICD-10-CM

## 2016-02-03 DIAGNOSIS — D693 Immune thrombocytopenic purpura: Secondary | ICD-10-CM | POA: Diagnosis not present

## 2016-02-03 LAB — CBC WITH DIFFERENTIAL/PLATELET
BASO%: 0.4 % (ref 0.0–2.0)
Basophils Absolute: 0 10*3/uL (ref 0.0–0.1)
EOS%: 1.1 % (ref 0.0–7.0)
Eosinophils Absolute: 0 10*3/uL (ref 0.0–0.5)
HEMATOCRIT: 40 % (ref 38.4–49.9)
HEMOGLOBIN: 13.3 g/dL (ref 13.0–17.1)
LYMPH#: 0.8 10*3/uL — AB (ref 0.9–3.3)
LYMPH%: 28.1 % (ref 14.0–49.0)
MCH: 30.6 pg (ref 27.2–33.4)
MCHC: 33.3 g/dL (ref 32.0–36.0)
MCV: 92 fL (ref 79.3–98.0)
MONO#: 1 10*3/uL — AB (ref 0.1–0.9)
MONO%: 38 % — ABNORMAL HIGH (ref 0.0–14.0)
NEUT%: 32.4 % — ABNORMAL LOW (ref 39.0–75.0)
NEUTROS ABS: 0.9 10*3/uL — AB (ref 1.5–6.5)
NRBC: 0 % (ref 0–0)
Platelets: 50 10*3/uL — ABNORMAL LOW (ref 140–400)
RBC: 4.35 10*6/uL (ref 4.20–5.82)
RDW: 13.2 % (ref 11.0–14.6)
WBC: 2.7 10*3/uL — AB (ref 4.0–10.3)

## 2016-02-03 LAB — CHCC SMEAR

## 2016-02-03 MED ORDER — ROMIPLOSTIM 250 MCG ~~LOC~~ SOLR
90.0000 ug | Freq: Once | SUBCUTANEOUS | Status: AC
  Administered 2016-02-03: 90 ug via SUBCUTANEOUS
  Filled 2016-02-03: qty 0.18

## 2016-02-06 ENCOUNTER — Telehealth: Payer: Self-pay | Admitting: *Deleted

## 2016-02-06 NOTE — Telephone Encounter (Signed)
Pt called - no answer; left message Platelet count 50,000 and to continue same dose of N-plate per Dr Beryle Beams. And to call if he has any questions.

## 2016-02-06 NOTE — Telephone Encounter (Signed)
-----   Message from Annia Belt, MD sent at 02/04/2016  8:41 AM EST ----- Call pt: platelet count hanging out around 50,000, I will continue same dose of N-plate

## 2016-02-09 ENCOUNTER — Other Ambulatory Visit: Payer: Self-pay | Admitting: Oncology

## 2016-02-09 DIAGNOSIS — D72821 Monocytosis (symptomatic): Secondary | ICD-10-CM

## 2016-02-09 DIAGNOSIS — D693 Immune thrombocytopenic purpura: Secondary | ICD-10-CM

## 2016-02-09 DIAGNOSIS — D72819 Decreased white blood cell count, unspecified: Secondary | ICD-10-CM

## 2016-02-10 ENCOUNTER — Other Ambulatory Visit (HOSPITAL_BASED_OUTPATIENT_CLINIC_OR_DEPARTMENT_OTHER): Payer: Commercial Managed Care - HMO

## 2016-02-10 ENCOUNTER — Ambulatory Visit (HOSPITAL_BASED_OUTPATIENT_CLINIC_OR_DEPARTMENT_OTHER): Payer: Commercial Managed Care - HMO

## 2016-02-10 VITALS — BP 148/72 | HR 64 | Temp 98.2°F

## 2016-02-10 DIAGNOSIS — D693 Immune thrombocytopenic purpura: Secondary | ICD-10-CM

## 2016-02-10 DIAGNOSIS — D72821 Monocytosis (symptomatic): Secondary | ICD-10-CM

## 2016-02-10 DIAGNOSIS — D72819 Decreased white blood cell count, unspecified: Secondary | ICD-10-CM

## 2016-02-10 LAB — CBC WITH DIFFERENTIAL/PLATELET
BASO%: 0.2 % (ref 0.0–2.0)
Basophils Absolute: 0 10*3/uL (ref 0.0–0.1)
EOS%: 0.5 % (ref 0.0–7.0)
Eosinophils Absolute: 0 10*3/uL (ref 0.0–0.5)
HEMATOCRIT: 40.5 % (ref 38.4–49.9)
HEMOGLOBIN: 13.7 g/dL (ref 13.0–17.1)
LYMPH#: 0.9 10*3/uL (ref 0.9–3.3)
LYMPH%: 21.8 % (ref 14.0–49.0)
MCH: 30.9 pg (ref 27.2–33.4)
MCHC: 33.8 g/dL (ref 32.0–36.0)
MCV: 91.4 fL (ref 79.3–98.0)
MONO#: 1.7 10*3/uL — AB (ref 0.1–0.9)
MONO%: 42 % — ABNORMAL HIGH (ref 0.0–14.0)
NEUT%: 35.5 % — AB (ref 39.0–75.0)
NEUTROS ABS: 1.5 10*3/uL (ref 1.5–6.5)
Platelets: 52 10*3/uL — ABNORMAL LOW (ref 140–400)
RBC: 4.43 10*6/uL (ref 4.20–5.82)
RDW: 13 % (ref 11.0–14.6)
WBC: 4.1 10*3/uL (ref 4.0–10.3)
nRBC: 0 % (ref 0–0)

## 2016-02-10 MED ORDER — ROMIPLOSTIM 250 MCG ~~LOC~~ SOLR
90.0000 ug | Freq: Once | SUBCUTANEOUS | Status: AC
  Administered 2016-02-10: 90 ug via SUBCUTANEOUS
  Filled 2016-02-10: qty 0.18

## 2016-02-11 ENCOUNTER — Telehealth: Payer: Self-pay | Admitting: *Deleted

## 2016-02-11 ENCOUNTER — Telehealth: Payer: Self-pay | Admitting: Oncology

## 2016-02-11 NOTE — Telephone Encounter (Signed)
Pt called / informed Platelet count stable @ 52,000 and WBC better than usual @ 4,100 per Dr Beryle Beams.  Stated he's doing well after his NPlate injection.

## 2016-02-11 NOTE — Telephone Encounter (Signed)
-----   Message from Annia Belt, MD sent at 02/10/2016  3:42 PM EST ----- Call pt: platelet count stable at 52,000.  WBC better than usual at 4,100

## 2016-02-11 NOTE — Telephone Encounter (Signed)
Per 2/20 pof from [place] change cbc to q2w on day of nplate injection. Spoke with injection nurse Thayer Headings) to confirm patient still receiving nplate weekly. Last lab 2/21 - cxd 2/28 lab and scheduled labs for q2w starting 3/7. Also added additional wkly nplate injections until patient sees Lavone Nian again in April. Spoke with patient he is aware and will get new schedule at next appointment 2/28.

## 2016-02-17 ENCOUNTER — Ambulatory Visit (HOSPITAL_BASED_OUTPATIENT_CLINIC_OR_DEPARTMENT_OTHER): Payer: Commercial Managed Care - HMO

## 2016-02-17 ENCOUNTER — Other Ambulatory Visit: Payer: Commercial Managed Care - HMO

## 2016-02-17 VITALS — BP 138/77 | HR 63 | Temp 98.4°F

## 2016-02-17 DIAGNOSIS — D693 Immune thrombocytopenic purpura: Secondary | ICD-10-CM

## 2016-02-17 MED ORDER — ROMIPLOSTIM 250 MCG ~~LOC~~ SOLR
90.0000 ug | Freq: Once | SUBCUTANEOUS | Status: AC
  Administered 2016-02-17: 90 ug via SUBCUTANEOUS
  Filled 2016-02-17: qty 0.18

## 2016-02-24 ENCOUNTER — Ambulatory Visit (HOSPITAL_BASED_OUTPATIENT_CLINIC_OR_DEPARTMENT_OTHER): Payer: Commercial Managed Care - HMO

## 2016-02-24 ENCOUNTER — Other Ambulatory Visit (HOSPITAL_BASED_OUTPATIENT_CLINIC_OR_DEPARTMENT_OTHER): Payer: Commercial Managed Care - HMO

## 2016-02-24 VITALS — BP 140/66 | HR 68 | Temp 98.4°F

## 2016-02-24 DIAGNOSIS — D693 Immune thrombocytopenic purpura: Secondary | ICD-10-CM

## 2016-02-24 DIAGNOSIS — D72821 Monocytosis (symptomatic): Secondary | ICD-10-CM

## 2016-02-24 DIAGNOSIS — D72819 Decreased white blood cell count, unspecified: Secondary | ICD-10-CM

## 2016-02-24 LAB — CBC WITH DIFFERENTIAL/PLATELET
BASO%: 0.3 % (ref 0.0–2.0)
BASOS ABS: 0 10*3/uL (ref 0.0–0.1)
EOS%: 0.3 % (ref 0.0–7.0)
Eosinophils Absolute: 0 10*3/uL (ref 0.0–0.5)
HCT: 41 % (ref 38.4–49.9)
HGB: 13.6 g/dL (ref 13.0–17.1)
LYMPH%: 22.3 % (ref 14.0–49.0)
MCH: 30.3 pg (ref 27.2–33.4)
MCHC: 33.2 g/dL (ref 32.0–36.0)
MCV: 91.3 fL (ref 79.3–98.0)
MONO#: 1.2 10*3/uL — ABNORMAL HIGH (ref 0.1–0.9)
MONO%: 33 % — AB (ref 0.0–14.0)
NEUT#: 1.7 10*3/uL (ref 1.5–6.5)
NEUT%: 44.1 % (ref 39.0–75.0)
Platelets: 48 10*3/uL — ABNORMAL LOW (ref 140–400)
RBC: 4.49 10*6/uL (ref 4.20–5.82)
RDW: 12.5 % (ref 11.0–14.6)
WBC: 3.7 10*3/uL — ABNORMAL LOW (ref 4.0–10.3)
lymph#: 0.8 10*3/uL — ABNORMAL LOW (ref 0.9–3.3)

## 2016-02-24 LAB — COMPREHENSIVE METABOLIC PANEL
ALT: 20 U/L (ref 0–55)
ANION GAP: 8 meq/L (ref 3–11)
AST: 19 U/L (ref 5–34)
Albumin: 3.6 g/dL (ref 3.5–5.0)
Alkaline Phosphatase: 47 U/L (ref 40–150)
BUN: 13.7 mg/dL (ref 7.0–26.0)
CALCIUM: 9.1 mg/dL (ref 8.4–10.4)
CHLORIDE: 106 meq/L (ref 98–109)
CO2: 28 meq/L (ref 22–29)
CREATININE: 1.2 mg/dL (ref 0.7–1.3)
EGFR: 63 mL/min/{1.73_m2} — ABNORMAL LOW (ref >90)
Glucose: 133 mg/dl (ref 70–140)
POTASSIUM: 4 meq/L (ref 3.5–5.1)
SODIUM: 142 meq/L (ref 136–145)
Total Bilirubin: 0.72 mg/dL (ref 0.20–1.20)
Total Protein: 6.8 g/dL (ref 6.4–8.3)

## 2016-02-24 MED ORDER — ROMIPLOSTIM 250 MCG ~~LOC~~ SOLR
90.0000 ug | Freq: Once | SUBCUTANEOUS | Status: AC
  Administered 2016-02-24: 90 ug via SUBCUTANEOUS
  Filled 2016-02-24: qty 0.18

## 2016-02-26 ENCOUNTER — Telehealth: Payer: Self-pay | Admitting: *Deleted

## 2016-02-26 NOTE — Telephone Encounter (Signed)
-----   Message from Annia Belt, MD sent at 02/24/2016  3:30 PM EST ----- Call pt: platelets 48,000 within range of previous values

## 2016-02-26 NOTE — Telephone Encounter (Signed)
Pt called - no answer; left message platelets @ 48,000 within range of previous range (which was 52) per Dr Beryle Beams. And to call if he has any questions.

## 2016-03-02 ENCOUNTER — Ambulatory Visit (HOSPITAL_BASED_OUTPATIENT_CLINIC_OR_DEPARTMENT_OTHER): Payer: Commercial Managed Care - HMO

## 2016-03-02 VITALS — BP 154/67 | HR 64 | Temp 98.2°F

## 2016-03-02 DIAGNOSIS — D693 Immune thrombocytopenic purpura: Secondary | ICD-10-CM

## 2016-03-02 MED ORDER — ROMIPLOSTIM 250 MCG ~~LOC~~ SOLR
90.0000 ug | Freq: Once | SUBCUTANEOUS | Status: AC
  Administered 2016-03-02: 90 ug via SUBCUTANEOUS
  Filled 2016-03-02: qty 0.18

## 2016-03-09 ENCOUNTER — Ambulatory Visit (HOSPITAL_BASED_OUTPATIENT_CLINIC_OR_DEPARTMENT_OTHER): Payer: Commercial Managed Care - HMO

## 2016-03-09 ENCOUNTER — Other Ambulatory Visit (HOSPITAL_BASED_OUTPATIENT_CLINIC_OR_DEPARTMENT_OTHER): Payer: Commercial Managed Care - HMO

## 2016-03-09 VITALS — BP 144/68 | HR 66 | Temp 98.7°F

## 2016-03-09 DIAGNOSIS — D693 Immune thrombocytopenic purpura: Secondary | ICD-10-CM

## 2016-03-09 DIAGNOSIS — D72819 Decreased white blood cell count, unspecified: Secondary | ICD-10-CM

## 2016-03-09 DIAGNOSIS — D72821 Monocytosis (symptomatic): Secondary | ICD-10-CM

## 2016-03-09 LAB — CBC WITH DIFFERENTIAL/PLATELET
BASO%: 0.3 % (ref 0.0–2.0)
BASOS ABS: 0 10*3/uL (ref 0.0–0.1)
EOS%: 0.3 % (ref 0.0–7.0)
Eosinophils Absolute: 0 10*3/uL (ref 0.0–0.5)
HEMATOCRIT: 41 % (ref 38.4–49.9)
HEMOGLOBIN: 13.7 g/dL (ref 13.0–17.1)
LYMPH#: 0.7 10*3/uL — AB (ref 0.9–3.3)
LYMPH%: 21.1 % (ref 14.0–49.0)
MCH: 30.5 pg (ref 27.2–33.4)
MCHC: 33.4 g/dL (ref 32.0–36.0)
MCV: 91.3 fL (ref 79.3–98.0)
MONO#: 1.2 10*3/uL — ABNORMAL HIGH (ref 0.1–0.9)
MONO%: 34.1 % — AB (ref 0.0–14.0)
NEUT#: 1.5 10*3/uL (ref 1.5–6.5)
NEUT%: 44.2 % (ref 39.0–75.0)
PLATELETS: 42 10*3/uL — AB (ref 140–400)
RBC: 4.49 10*6/uL (ref 4.20–5.82)
RDW: 12.4 % (ref 11.0–14.6)
WBC: 3.4 10*3/uL — ABNORMAL LOW (ref 4.0–10.3)
nRBC: 0 % (ref 0–0)

## 2016-03-09 MED ORDER — ROMIPLOSTIM 250 MCG ~~LOC~~ SOLR
90.0000 ug | Freq: Once | SUBCUTANEOUS | Status: AC
  Administered 2016-03-09: 90 ug via SUBCUTANEOUS
  Filled 2016-03-09: qty 0.18

## 2016-03-12 ENCOUNTER — Telehealth: Payer: Self-pay | Admitting: *Deleted

## 2016-03-12 NOTE — Telephone Encounter (Signed)
-----   Message from Annia Belt, MD sent at 03/10/2016 11:07 AM EDT ----- Call pt: platelets running a little lower than usual @ 42,000. Stay on same dose of Promacta for now.

## 2016-03-12 NOTE — Telephone Encounter (Signed)
Pt called / informed "platelets running a little lower than usual @ 42,000; Stay on same dose of Promacta for now" per Dr Beryle Beams. And to call if he has any questions.

## 2016-03-16 ENCOUNTER — Ambulatory Visit (HOSPITAL_BASED_OUTPATIENT_CLINIC_OR_DEPARTMENT_OTHER): Payer: Commercial Managed Care - HMO

## 2016-03-16 VITALS — BP 143/66 | HR 65 | Temp 98.5°F

## 2016-03-16 DIAGNOSIS — D693 Immune thrombocytopenic purpura: Secondary | ICD-10-CM | POA: Diagnosis not present

## 2016-03-16 MED ORDER — ROMIPLOSTIM 250 MCG ~~LOC~~ SOLR
90.0000 ug | Freq: Once | SUBCUTANEOUS | Status: AC
  Administered 2016-03-16: 90 ug via SUBCUTANEOUS
  Filled 2016-03-16: qty 0.18

## 2016-03-17 ENCOUNTER — Telehealth: Payer: Self-pay | Admitting: Family Medicine

## 2016-03-17 NOTE — Telephone Encounter (Signed)
APPT. REMINDER CALL, LMTCB °

## 2016-03-19 ENCOUNTER — Other Ambulatory Visit (INDEPENDENT_AMBULATORY_CARE_PROVIDER_SITE_OTHER): Payer: Commercial Managed Care - HMO

## 2016-03-19 DIAGNOSIS — D72819 Decreased white blood cell count, unspecified: Secondary | ICD-10-CM

## 2016-03-19 DIAGNOSIS — D709 Neutropenia, unspecified: Secondary | ICD-10-CM | POA: Diagnosis not present

## 2016-03-19 DIAGNOSIS — D72821 Monocytosis (symptomatic): Secondary | ICD-10-CM

## 2016-03-19 DIAGNOSIS — D693 Immune thrombocytopenic purpura: Secondary | ICD-10-CM

## 2016-03-19 LAB — CBC WITH DIFFERENTIAL/PLATELET
BASOS PCT: 1 %
Basophils Absolute: 0 10*3/uL (ref 0.0–0.1)
EOS PCT: 1 %
Eosinophils Absolute: 0 10*3/uL (ref 0.0–0.7)
HEMATOCRIT: 40.3 % (ref 39.0–52.0)
HEMOGLOBIN: 13.6 g/dL (ref 13.0–17.0)
LYMPHS ABS: 0.8 10*3/uL (ref 0.7–4.0)
Lymphocytes Relative: 35 %
MCH: 30.6 pg (ref 26.0–34.0)
MCHC: 33.7 g/dL (ref 30.0–36.0)
MCV: 90.6 fL (ref 78.0–100.0)
MONO ABS: 0.8 10*3/uL (ref 0.1–1.0)
MONOS PCT: 35 %
NEUTROS PCT: 28 %
Neutro Abs: 0.6 10*3/uL — ABNORMAL LOW (ref 1.7–7.7)
Platelets: 45 10*3/uL — ABNORMAL LOW (ref 150–400)
RBC: 4.45 MIL/uL (ref 4.22–5.81)
RDW: 12.2 % (ref 11.5–15.5)
WBC: 2.2 10*3/uL — ABNORMAL LOW (ref 4.0–10.5)

## 2016-03-19 NOTE — Addendum Note (Signed)
Addended by: Truddie Crumble on: 03/19/2016 11:15 AM   Modules accepted: Orders

## 2016-03-22 LAB — PATHOLOGIST SMEAR REVIEW

## 2016-03-23 ENCOUNTER — Other Ambulatory Visit (HOSPITAL_BASED_OUTPATIENT_CLINIC_OR_DEPARTMENT_OTHER): Payer: Commercial Managed Care - HMO

## 2016-03-23 ENCOUNTER — Ambulatory Visit (HOSPITAL_BASED_OUTPATIENT_CLINIC_OR_DEPARTMENT_OTHER): Payer: Commercial Managed Care - HMO

## 2016-03-23 VITALS — BP 149/67 | HR 62 | Temp 98.7°F

## 2016-03-23 DIAGNOSIS — D72819 Decreased white blood cell count, unspecified: Secondary | ICD-10-CM

## 2016-03-23 DIAGNOSIS — D693 Immune thrombocytopenic purpura: Secondary | ICD-10-CM | POA: Diagnosis not present

## 2016-03-23 DIAGNOSIS — D72821 Monocytosis (symptomatic): Secondary | ICD-10-CM

## 2016-03-23 LAB — CBC WITH DIFFERENTIAL/PLATELET
BASO%: 1.2 % (ref 0.0–2.0)
Basophils Absolute: 0 10*3/uL (ref 0.0–0.1)
EOS%: 0.6 % (ref 0.0–7.0)
Eosinophils Absolute: 0 10*3/uL (ref 0.0–0.5)
HEMATOCRIT: 42 % (ref 38.4–49.9)
HEMOGLOBIN: 13.6 g/dL (ref 13.0–17.1)
LYMPH#: 1 10*3/uL (ref 0.9–3.3)
LYMPH%: 29.9 % (ref 14.0–49.0)
MCH: 29.4 pg (ref 27.2–33.4)
MCHC: 32.3 g/dL (ref 32.0–36.0)
MCV: 90.8 fL (ref 79.3–98.0)
MONO#: 1.4 10*3/uL — AB (ref 0.1–0.9)
MONO%: 38.8 % — ABNORMAL HIGH (ref 0.0–14.0)
NEUT#: 1 10*3/uL — ABNORMAL LOW (ref 1.5–6.5)
NEUT%: 29.5 % — ABNORMAL LOW (ref 39.0–75.0)
Platelets: 44 10*3/uL — ABNORMAL LOW (ref 140–400)
RBC: 4.62 10*6/uL (ref 4.20–5.82)
RDW: 12.8 % (ref 11.0–14.6)
WBC: 3.5 10*3/uL — ABNORMAL LOW (ref 4.0–10.3)

## 2016-03-23 MED ORDER — ROMIPLOSTIM 250 MCG ~~LOC~~ SOLR
90.0000 ug | Freq: Once | SUBCUTANEOUS | Status: AC
  Administered 2016-03-23: 90 ug via SUBCUTANEOUS
  Filled 2016-03-23: qty 0.18

## 2016-03-25 ENCOUNTER — Telehealth: Payer: Self-pay | Admitting: *Deleted

## 2016-03-25 NOTE — Telephone Encounter (Signed)
Pt called - no answer; left message platelets are 45,000 and stay on current dose of Promacta; and sorry no charge for extra CBC per Dr Beryle Beams. And to call if he has any questions.

## 2016-03-25 NOTE — Telephone Encounter (Signed)
-----   Message from Annia Belt, MD sent at 03/19/2016  1:40 PM EDT ----- Call pt: platelets 45,000 stay on current dose of promacta

## 2016-03-30 ENCOUNTER — Ambulatory Visit (HOSPITAL_BASED_OUTPATIENT_CLINIC_OR_DEPARTMENT_OTHER): Payer: Commercial Managed Care - HMO

## 2016-03-30 VITALS — BP 144/72 | HR 71 | Temp 98.5°F

## 2016-03-30 DIAGNOSIS — D693 Immune thrombocytopenic purpura: Secondary | ICD-10-CM

## 2016-03-30 MED ORDER — ROMIPLOSTIM 250 MCG ~~LOC~~ SOLR
90.0000 ug | Freq: Once | SUBCUTANEOUS | Status: AC
  Administered 2016-03-30: 90 ug via SUBCUTANEOUS
  Filled 2016-03-30: qty 0.18

## 2016-04-06 ENCOUNTER — Other Ambulatory Visit: Payer: Self-pay | Admitting: Oncology

## 2016-04-06 ENCOUNTER — Encounter: Payer: Self-pay | Admitting: Oncology

## 2016-04-06 ENCOUNTER — Ambulatory Visit: Payer: Commercial Managed Care - HMO

## 2016-04-06 ENCOUNTER — Ambulatory Visit (INDEPENDENT_AMBULATORY_CARE_PROVIDER_SITE_OTHER): Payer: Commercial Managed Care - HMO | Admitting: Oncology

## 2016-04-06 ENCOUNTER — Other Ambulatory Visit: Payer: Commercial Managed Care - HMO

## 2016-04-06 VITALS — BP 162/71 | HR 70 | Temp 97.6°F | Ht 71.0 in | Wt 207.9 lb

## 2016-04-06 DIAGNOSIS — D693 Immune thrombocytopenic purpura: Secondary | ICD-10-CM

## 2016-04-06 DIAGNOSIS — D72821 Monocytosis (symptomatic): Secondary | ICD-10-CM | POA: Diagnosis not present

## 2016-04-06 DIAGNOSIS — I10 Essential (primary) hypertension: Secondary | ICD-10-CM | POA: Diagnosis not present

## 2016-04-06 DIAGNOSIS — D72819 Decreased white blood cell count, unspecified: Secondary | ICD-10-CM

## 2016-04-06 MED ORDER — ELTROMBOPAG OLAMINE 50 MG PO TABS: 50.0000 mg | ORAL_TABLET | Freq: Every day | ORAL | Status: DC

## 2016-04-06 NOTE — Patient Instructions (Addendum)
Stop N-Plate injections Resume Promacta 50 mg one daily Schedule lab CBC every 2 months, CMet every 6 months at [facility] MD visit 6 months at [place]

## 2016-04-06 NOTE — Progress Notes (Signed)
Patient ID: Billy [name], male   DOB: 1941/09/18, 75 y.o.   MRN: [id number] Hematology and Oncology Follow Up Visit  Billy [name] [id number] 08-02-1941 75 y.o. 04/06/2016 3:39 PM   Principle Diagnosis: Encounter Diagnoses  Name Primary?  . Chronic ITP (idiopathic thrombocytopenia) (HCC) Yes  . Leukopenia   . 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 He had mild thrombocytopenia documented as early as March 2007 when platelet count was 123,000. A CBC done in February 2009 showed platelets down to 42,000.  He was treated with observation alone until November 2009 when platelet count drifted down to 25,000. He had a minimal response to steroids and no improvement with addition of danazol added in January 2010. Due to the development of some atypical features with leukopenia and monocytosis I did a bone marrow aspiration and biopsy on 06/12/2009 to exclude additional pathology. Bone marrow and cytogenetic studies on the marrow were normal. No excess blasts. No excess plasma cells.  He was given a trial of Rituxan antibody between 07/01/2009 and 07/22/2009. He had no response. He was started on weekly Nplate injections on 49/70/2637 which was successful in maintaining his platelet counts above 50,000 with peak counts as high as 144,000. He stayed on the drug for 2-1/2 years.  He was transitioned to the oral thrombopoietin receptor agonist, Promacta (eltrombopag) which was started in March 2013. He remained on this drug until January 2017 when he elected to go back on the weekly N-Plate injections. Platelet count in the 40-50,000 range on most recent dose. He tells me today that his insurance is not paying any better on the Romiplostim than it was on the Promacta so he would like to go back on the pills.  Interim History:   He's had no interim medical problems. He denies any epistaxis, gum bleeding, hematuria, or  hematochezia. No interim infections.  Medications: reviewed  Allergies:  Allergies  Allergen Reactions  . Penicillins Other (See Comments)    Doesn't remember was infant    Review of Systems: See Interim History Remaining ROS negative:   Physical Exam: Blood pressure 162/71, pulse 70, temperature 97.6 F (36.4 C), temperature source Oral, height '5\' 11"'$  (1.803 m), weight 207 lb 14.4 oz (94.303 kg), SpO2 97 %. Wt Readings from Last 3 Encounters:  04/06/16 207 lb 14.4 oz (94.303 kg)  09/23/15 202 lb 9.6 oz (91.899 kg)  03/03/15 207 lb 9.6 oz (94.167 kg)     General appearance: well nourished Caucasian man [name]: Pharynx no erythema, exudate, mass, or ulcer. No thyromegaly or thyroid nodules Lymph nodes: No cervical, supraclavicular, or axillary lymphadenopathy Breasts:  Lungs: Clear to auscultation, resonant to percussion throughout Heart: Regular rhythm, no murmur, no gallop, no rub, no click, no edema Abdomen: Soft, nontender, normal bowel sounds, no mass, no organomegaly Extremities: No edema, no calf tenderness Musculoskeletal: no joint deformities GU:  Vascular: Carotid pulses 2+, no bruits, Neurologic: Alert, oriented, PERRLA,  cranial nerves grossly normal, motor strength 5 over 5, reflexes 1+ symmetric, upper body coordination normal, gait normal, Skin: No rash or ecchymosis  Lab Results: CBC W/Diff    Component Value Date/Time   WBC 3.5* 03/23/2016 1515   WBC 2.2* 03/19/2016 1101   WBC 2.6* 09/05/2015 1130   RBC 4.62 03/23/2016 1515   RBC 4.45 03/19/2016 1101   RBC 4.55 09/05/2015 1130   HGB 13.6 03/23/2016 1515   HGB 13.6 03/19/2016 1101   HCT 42.0  03/23/2016 1515   HCT 40.3 03/19/2016 1101   HCT 41.6 09/05/2015 1130   PLT 44 Large platelets present* 03/23/2016 1515   PLT 45* 03/19/2016 1101   PLT 76* 09/05/2015 1130   MCV 90.8 03/23/2016 1515   MCV 90.6 03/19/2016 1101   MCV 91 09/05/2015 1130   MCH 29.4 03/23/2016 1515   MCH 30.6 03/19/2016 1101    MCH 30.3 09/05/2015 1130   MCHC 32.3 03/23/2016 1515   MCHC 33.7 03/19/2016 1101   MCHC 33.2 09/05/2015 1130   RDW 12.8 03/23/2016 1515   RDW 12.2 03/19/2016 1101   RDW 13.6 09/05/2015 1130   LYMPHSABS 1.0 03/23/2016 1515   LYMPHSABS 0.8 03/19/2016 1101   LYMPHSABS 1.0 09/05/2015 1130   MONOABS 1.4* 03/23/2016 1515   MONOABS 0.8 03/19/2016 1101   EOSABS 0.0 03/23/2016 1515   EOSABS 0.0 03/19/2016 1101   EOSABS 0.0 09/05/2015 1130   BASOSABS 0.0 03/23/2016 1515   BASOSABS 0.0 03/19/2016 1101   BASOSABS 0.0 09/05/2015 1130     Chemistry      Component Value Date/Time   NA 142 02/24/2016 1507   NA 144 09/05/2015 1130   NA 142 06/02/2015 1155   K 4.0 02/24/2016 1507   K 4.2 09/05/2015 1130   CL 103 09/05/2015 1130   CL 106 05/31/2013 0920   CO2 28 02/24/2016 1507   CO2 25 09/05/2015 1130   BUN 13.7 02/24/2016 1507   BUN 11 09/05/2015 1130   BUN 12 06/02/2015 1155   CREATININE 1.2 02/24/2016 1507   CREATININE 0.84 09/05/2015 1130   CREATININE 0.98 06/02/2015 1155      Component Value Date/Time   CALCIUM 9.1 02/24/2016 1507   CALCIUM 8.9 09/05/2015 1130   ALKPHOS 47 02/24/2016 1507   ALKPHOS 46 09/05/2015 1130   AST 19 02/24/2016 1507   AST 16 09/05/2015 1130   ALT 20 02/24/2016 1507   ALT 14 09/05/2015 1130   BILITOT 0.72 02/24/2016 1507   BILITOT 0.5 09/05/2015 1130   BILITOT 0.7 06/02/2015 1155       Radiological Studies: No results found.  Impression:  #1. Chronic ITP We will change back to Promacta 50 mg daily at this time. Check blood counts in one month and then every other month. Check blood chemistries with liver functions every 6 months.  #2. Chronic neutropenia and chronic monocytosis with normal bone marrow biopsy likely autoimmune and related to his ITP  #3. Essential hypertension Pressure running a little high today. He was running late and rush to get to the office. If pressure stays up, adjustments in his medications will be made by his  primary care physician.   CC: Patient Care Team: Antony Contras, MD as PCP - General (Family Medicine) Annia Belt, MD as Consulting Physician (Hematology and Oncology)   Annia Belt, MD 4/18/20173:39 PM

## 2016-04-07 MED FILL — *PROMACTA 50 MG TABLET: 50 | 30 days supply | Qty: 30 | Fill #0

## 2016-04-12 ENCOUNTER — Ambulatory Visit: Payer: Commercial Managed Care - HMO

## 2016-04-13 ENCOUNTER — Ambulatory Visit: Payer: Commercial Managed Care - HMO

## 2016-04-20 ENCOUNTER — Other Ambulatory Visit: Payer: Commercial Managed Care - HMO

## 2016-04-20 ENCOUNTER — Ambulatory Visit: Payer: Commercial Managed Care - HMO

## 2016-05-03 DIAGNOSIS — R1030 Lower abdominal pain, unspecified: Secondary | ICD-10-CM | POA: Diagnosis not present

## 2016-05-19 ENCOUNTER — Telehealth: Payer: Self-pay | Admitting: Oncology

## 2016-05-19 MED FILL — *PROMACTA 50 MG TABLET: 50 | 30 days supply | Qty: 30 | Fill #1

## 2016-05-19 NOTE — Telephone Encounter (Signed)
Talked to pt - stated received a letter/PA was denied. Informed pt . I will talked with Aquilla Hacker who had started the PA. Stated she will f/u and call pt back later.

## 2016-05-19 NOTE — Telephone Encounter (Signed)
eltrombopag (PROMACTA) 50 MG tablet Needs prior authoization

## 2016-05-19 NOTE — Telephone Encounter (Signed)
Initial PA was denied-an expedited appeal was submitted over the phone on 05/18/2016.  The result of the appeal was favorable, but only for six months.  I contacted pharmacy to resubmit rx and was informed that patient had already picked it up.  Authorization will expire on Nov.30th, 2017.  Pt was contacted and instructed to contact the office prior to this date to ensure he doesn't miss any doses. Phone call complete.      [employer] Member ID# [id number] Ref# [id number]

## 2016-06-14 MED FILL — *PROMACTA 50 MG TABLET: 50 | 30 days supply | Qty: 30 | Fill #2

## 2016-06-15 ENCOUNTER — Other Ambulatory Visit (HOSPITAL_BASED_OUTPATIENT_CLINIC_OR_DEPARTMENT_OTHER): Payer: Commercial Managed Care - HMO

## 2016-06-15 DIAGNOSIS — D72821 Monocytosis (symptomatic): Secondary | ICD-10-CM

## 2016-06-15 DIAGNOSIS — D72819 Decreased white blood cell count, unspecified: Secondary | ICD-10-CM

## 2016-06-15 DIAGNOSIS — D693 Immune thrombocytopenic purpura: Secondary | ICD-10-CM

## 2016-06-15 LAB — COMPREHENSIVE METABOLIC PANEL
ALK PHOS: 48 U/L (ref 40–150)
ALT: 21 U/L (ref 0–55)
ANION GAP: 8 meq/L (ref 3–11)
AST: 23 U/L (ref 5–34)
Albumin: 3.6 g/dL (ref 3.5–5.0)
BILIRUBIN TOTAL: 0.5 mg/dL (ref 0.20–1.20)
BUN: 14.8 mg/dL (ref 7.0–26.0)
CALCIUM: 9.2 mg/dL (ref 8.4–10.4)
CO2: 27 meq/L (ref 22–29)
Chloride: 107 mEq/L (ref 98–109)
Creatinine: 1 mg/dL (ref 0.7–1.3)
EGFR: 79 mL/min/{1.73_m2} — AB (ref >90)
Glucose: 107 mg/dl (ref 70–140)
Potassium: 4.2 mEq/L (ref 3.5–5.1)
Sodium: 142 mEq/L (ref 136–145)
TOTAL PROTEIN: 6.7 g/dL (ref 6.4–8.3)

## 2016-06-15 LAB — CBC WITH DIFFERENTIAL/PLATELET
BASO%: 0.3 % (ref 0.0–2.0)
BASOS ABS: 0 10*3/uL (ref 0.0–0.1)
EOS ABS: 0 10*3/uL (ref 0.0–0.5)
EOS%: 0.7 % (ref 0.0–7.0)
HCT: 37.3 % — ABNORMAL LOW (ref 38.4–49.9)
HGB: 12.4 g/dL — ABNORMAL LOW (ref 13.0–17.1)
LYMPH%: 28.7 % (ref 14.0–49.0)
MCH: 29.7 pg (ref 27.2–33.4)
MCHC: 33.2 g/dL (ref 32.0–36.0)
MCV: 89.4 fL (ref 79.3–98.0)
MONO#: 0.9 10*3/uL (ref 0.1–0.9)
MONO%: 30.4 % — ABNORMAL HIGH (ref 0.0–14.0)
NEUT#: 1.2 10*3/uL — ABNORMAL LOW (ref 1.5–6.5)
NEUT%: 39.9 % (ref 39.0–75.0)
NRBC: 1 % — AB (ref 0–0)
PLATELETS: 68 10*3/uL — AB (ref 140–400)
RBC: 4.17 10*6/uL — AB (ref 4.20–5.82)
RDW: 14.1 % (ref 11.0–14.6)
WBC: 3 10*3/uL — ABNORMAL LOW (ref 4.0–10.3)
lymph#: 0.9 10*3/uL (ref 0.9–3.3)

## 2016-06-15 LAB — TECHNOLOGIST REVIEW

## 2016-06-16 ENCOUNTER — Telehealth: Payer: Self-pay | Admitting: *Deleted

## 2016-06-16 NOTE — Telephone Encounter (Signed)
-----   Message from Annia Belt, MD sent at 06/15/2016  4:16 PM EDT ----- Call pt: WBC & Hb down slightly but platelets up @ 68,000. Liver chemistry remains normal

## 2016-06-16 NOTE — Telephone Encounter (Signed)
Pt called - no answer; left message "WBC and Hgb down slightly but Platelets up @ 68,000. Liver chemistry remains normal" per Dr Beryle Beams. And call if he has any questions.

## 2016-07-16 MED FILL — *PROMACTA 50 MG TABLET: 50 | 30 days supply | Qty: 30 | Fill #3

## 2016-07-21 DIAGNOSIS — I10 Essential (primary) hypertension: Secondary | ICD-10-CM | POA: Diagnosis not present

## 2016-07-21 DIAGNOSIS — D693 Immune thrombocytopenic purpura: Secondary | ICD-10-CM | POA: Diagnosis not present

## 2016-07-21 DIAGNOSIS — B354 Tinea corporis: Secondary | ICD-10-CM | POA: Diagnosis not present

## 2016-08-10 ENCOUNTER — Other Ambulatory Visit (HOSPITAL_BASED_OUTPATIENT_CLINIC_OR_DEPARTMENT_OTHER): Payer: Commercial Managed Care - HMO

## 2016-08-10 DIAGNOSIS — D72821 Monocytosis (symptomatic): Secondary | ICD-10-CM

## 2016-08-10 DIAGNOSIS — D693 Immune thrombocytopenic purpura: Secondary | ICD-10-CM

## 2016-08-10 DIAGNOSIS — D72819 Decreased white blood cell count, unspecified: Secondary | ICD-10-CM

## 2016-08-10 LAB — CBC WITH DIFFERENTIAL/PLATELET
BASO%: 0.9 % (ref 0.0–2.0)
BASOS ABS: 0 10*3/uL (ref 0.0–0.1)
EOS%: 0.6 % (ref 0.0–7.0)
Eosinophils Absolute: 0 10*3/uL (ref 0.0–0.5)
HCT: 34.5 % — ABNORMAL LOW (ref 38.4–49.9)
HGB: 11.3 g/dL — ABNORMAL LOW (ref 13.0–17.1)
LYMPH%: 27.7 % (ref 14.0–49.0)
MCH: 29.7 pg (ref 27.2–33.4)
MCHC: 32.8 g/dL (ref 32.0–36.0)
MCV: 90.8 fL (ref 79.3–98.0)
MONO#: 0.9 10*3/uL (ref 0.1–0.9)
MONO%: 25.9 % — AB (ref 0.0–14.0)
NEUT#: 1.5 10*3/uL (ref 1.5–6.5)
NEUT%: 44.9 % (ref 39.0–75.0)
Platelets: 69 10*3/uL — ABNORMAL LOW (ref 140–400)
RBC: 3.8 10*6/uL — ABNORMAL LOW (ref 4.20–5.82)
RDW: 15.8 % — AB (ref 11.0–14.6)
WBC: 3.4 10*3/uL — ABNORMAL LOW (ref 4.0–10.3)
lymph#: 0.9 10*3/uL (ref 0.9–3.3)
nRBC: 2 % — ABNORMAL HIGH (ref 0–0)

## 2016-08-10 LAB — TECHNOLOGIST REVIEW

## 2016-08-12 ENCOUNTER — Telehealth: Payer: Self-pay | Admitting: *Deleted

## 2016-08-12 NOTE — Telephone Encounter (Signed)
Pt called - no answer; left message "platelets at his baseline 69,000" per Dr Beryle Beams. And call for any questions.

## 2016-08-12 NOTE — Telephone Encounter (Signed)
-----   Message from Annia Belt, MD sent at 08/11/2016  4:32 PM EDT ----- Call pt: platelets at his baseline 69,000

## 2016-08-16 MED FILL — *PROMACTA 50 MG TABLET: 50 | 30 days supply | Qty: 30 | Fill #4

## 2016-09-15 MED FILL — *PROMACTA 50 MG TABLET: 50 | 30 days supply | Qty: 30 | Fill #5

## 2016-09-29 DIAGNOSIS — Z23 Encounter for immunization: Secondary | ICD-10-CM | POA: Diagnosis not present

## 2016-10-05 ENCOUNTER — Other Ambulatory Visit (HOSPITAL_BASED_OUTPATIENT_CLINIC_OR_DEPARTMENT_OTHER): Payer: Commercial Managed Care - HMO

## 2016-10-05 DIAGNOSIS — D72821 Monocytosis (symptomatic): Secondary | ICD-10-CM

## 2016-10-05 DIAGNOSIS — D693 Immune thrombocytopenic purpura: Secondary | ICD-10-CM

## 2016-10-05 DIAGNOSIS — D72819 Decreased white blood cell count, unspecified: Secondary | ICD-10-CM

## 2016-10-05 LAB — CBC WITH DIFFERENTIAL/PLATELET
BASO%: 0.6 % (ref 0.0–2.0)
Basophils Absolute: 0 10*3/uL (ref 0.0–0.1)
EOS ABS: 0 10*3/uL (ref 0.0–0.5)
EOS%: 0.3 % (ref 0.0–7.0)
HEMATOCRIT: 35.2 % — AB (ref 38.4–49.9)
HEMOGLOBIN: 11.5 g/dL — AB (ref 13.0–17.1)
LYMPH#: 0.9 10*3/uL (ref 0.9–3.3)
LYMPH%: 26 % (ref 14.0–49.0)
MCH: 29.8 pg (ref 27.2–33.4)
MCHC: 32.7 g/dL (ref 32.0–36.0)
MCV: 91.2 fL (ref 79.3–98.0)
MONO#: 1.1 10*3/uL — AB (ref 0.1–0.9)
MONO%: 32.8 % — ABNORMAL HIGH (ref 0.0–14.0)
NEUT%: 40.3 % (ref 39.0–75.0)
NEUTROS ABS: 1.4 10*3/uL — AB (ref 1.5–6.5)
NRBC: 2 % — AB (ref 0–0)
Platelets: 52 10*3/uL — ABNORMAL LOW (ref 140–400)
RBC: 3.86 10*6/uL — ABNORMAL LOW (ref 4.20–5.82)
RDW: 15.8 % — AB (ref 11.0–14.6)
WBC: 3.4 10*3/uL — AB (ref 4.0–10.3)

## 2016-10-07 ENCOUNTER — Telehealth: Payer: Self-pay | Admitting: *Deleted

## 2016-10-07 NOTE — Telephone Encounter (Signed)
-----   Message from Annia Belt, MD sent at 10/05/2016  4:58 PM EDT ----- Call pt: platelet count 52,000; everything else the same

## 2016-10-07 NOTE — Telephone Encounter (Signed)
Pt called - no answer; left message "platelet count 52,000; everything else the same" per Dr Beryle Beams. And to call for any questions.

## 2016-10-18 ENCOUNTER — Ambulatory Visit (INDEPENDENT_AMBULATORY_CARE_PROVIDER_SITE_OTHER): Payer: Commercial Managed Care - HMO | Admitting: Oncology

## 2016-10-18 ENCOUNTER — Encounter: Payer: Self-pay | Admitting: Oncology

## 2016-10-18 VITALS — BP 152/69 | HR 68 | Temp 97.7°F | Ht 71.0 in | Wt 199.2 lb

## 2016-10-18 DIAGNOSIS — D72821 Monocytosis (symptomatic): Secondary | ICD-10-CM

## 2016-10-18 DIAGNOSIS — Z79899 Other long term (current) drug therapy: Secondary | ICD-10-CM

## 2016-10-18 DIAGNOSIS — D693 Immune thrombocytopenic purpura: Secondary | ICD-10-CM

## 2016-10-18 DIAGNOSIS — Z880 Allergy status to penicillin: Secondary | ICD-10-CM

## 2016-10-18 DIAGNOSIS — D649 Anemia, unspecified: Secondary | ICD-10-CM | POA: Diagnosis not present

## 2016-10-18 DIAGNOSIS — D72819 Decreased white blood cell count, unspecified: Secondary | ICD-10-CM

## 2016-10-18 DIAGNOSIS — I10 Essential (primary) hypertension: Secondary | ICD-10-CM

## 2016-10-18 MED FILL — PROMACTA 50 MG TABLET: 50 | 30 days supply | Qty: 30 | Fill #6

## 2016-10-18 NOTE — Progress Notes (Signed)
Hematology and Oncology Follow Up Visit  Billy [name] [id number] 08/07/1941 75 y.o. 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 A CBC done in February 2009 showed platelets down to 42,000.  He was treated with observation alone until November 2009 when platelet count drifted down to 25,000. He had a minimal response to steroids and no improvement with addition of danazol added in January 2010. Due to the development of some atypical features with leukopenia and monocytosis I did a bone marrow aspiration and biopsy on 06/12/2009 to exclude additional pathology. Bone marrow and cytogenetic studies on the marrow were normal. No excess blasts. No excess plasma cells.  He was given a trial of Rituxan antibody between 07/01/2009 and 07/22/2009. He had no response. He was started on weekly Nplate injections on [id number] which was successful in maintaining his platelet counts above 50,000 with peak counts as high as 144,000. He stayed on the drug for 2-1/2 years.  He was transitioned to the oral thrombopoietin receptor agonist, Promacta (eltrombopag) which was started in March 2013. He has tolerated this product well and we have been able to maintain his platelet count at or above 50,000. I have been monitoring his liver functions periodically and they have remained normal. Platelets are noted to be large on peripheral blood film review so that machine counts are spuriously lower than what is reported.  Interim History:   Overall he is doing well. He has had no interim medical problems. He denies any obvious clinical bleeding. no epistaxis. No hematuria. No hematochezia or melena. In looking over his hematologic profile, his hemoglobin has drifted down significantly over the last 2 years from baseline 14 g as recently as February 2017 to  current value of 11.5 g with MCV 91. White count, percentage monocytes, and platelet count, unchanged over the same interval. Despite the anemia, his energy level has not changed. Appetite is good. Weight stable. He is not up-to-date on his routine health maintenance and cannot remember the last time he had a colonoscopy.  Medications: reviewed  Allergies:  Allergies  Allergen Reactions  . Penicillins Other (See Comments)    Doesn't remember was infant    Review of Systems: See interim history Remaining ROS negative:   Physical Exam: Blood pressure (!) 152/69, pulse 68, temperature 97.7 F (36.5 C), temperature source Oral, height 5\' 11"  (1.803 m), weight 199 lb 3.2 oz (90.4 kg), SpO2 97 %. Wt Readings from Last 3 Encounters:  10/18/16 199 lb 3.2 oz (90.4 kg)  04/06/16 207 lb 14.4 oz (94.3 kg)  09/23/15 202 lb 9.6 oz (91.9 kg)     General appearance: well nourished Caucasian man [name]: Pharynx no erythema, exudate, mass, or ulcer. No thyromegaly or thyroid nodules Lymph nodes: No cervical, supraclavicular, or axillary lymphadenopathy Breasts: Lungs: Clear to auscultation, resonant to percussion throughout Heart: Regular rhythm, no murmur, no gallop, no rub, no click, no edema Abdomen: Soft, nontender, normal bowel sounds, no mass, no organomegaly Extremities: No edema, no calf tenderness Musculoskeletal: no joint deformities GU:  Vascular: Carotid pulses 2+, no bruits, distal pulses:  Neurologic: Alert, oriented, PERRLA,  cranial nerves grossly normal, motor strength 5 over 5, reflexes 1+ symmetric, upper body coordination normal, gait normal, Skin: No rash or ecchymosis  Lab Results: CBC W/Diff    Component Value Date/Time   WBC 3.4 (L) 10/05/2016 1504   WBC  2.2 (L) 03/19/2016 1101   RBC 3.86 (L) 10/05/2016 1504   RBC 4.45 03/19/2016 1101   HGB 11.5 (L) 10/05/2016 1504   HCT 35.2 (L) 10/05/2016 1504   PLT 52 Large & giant platelets (L) 10/05/2016 1504   PLT 76 (LL)  09/05/2015 1130   MCV 91.2 10/05/2016 1504   MCH 29.8 10/05/2016 1504   MCH 30.6 03/19/2016 1101   MCHC 32.7 10/05/2016 1504   MCHC 33.7 03/19/2016 1101   RDW 15.8 (H) 10/05/2016 1504   LYMPHSABS 0.9 10/05/2016 1504   MONOABS 1.1 (H) 10/05/2016 1504   EOSABS 0.0 10/05/2016 1504   EOSABS 0.0 09/05/2015 1130   BASOSABS 0.0 10/05/2016 1504     Chemistry      Component Value Date/Time   NA 142 06/15/2016 1524   K 4.2 06/15/2016 1524   CL 103 09/05/2015 1130   CL 106 05/31/2013 0920   CO2 27 06/15/2016 1524   BUN 14.8 06/15/2016 1524   CREATININE 1.0 06/15/2016 1524      Component Value Date/Time   CALCIUM 9.2 06/15/2016 1524   ALKPHOS 48 06/15/2016 1524   AST 23 06/15/2016 1524   ALT 21 06/15/2016 1524   BILITOT 0.50 06/15/2016 1524       Radiological Studies: No results found.  Impression:  #1. Chronic ITP stable with a good partial response on Promacta.   #2. Chronic neutropenia with monocytosis. Suspect this is also on an immune basis.  #3. Essential hypertension  Blood pressure remains borderline controlled for age. Management deferred to his primary care physician.  #4. Normochromic anemia. There have been some reports in chronic use of thrombopoietin agents of marrow fibrosis. Although I would expect changes in his other cell lines if this was occurring, I think we need to do a bone marrow aspiration and biopsy for further assessment. He is agreeable. I have scheduled this for this Thursday, November 2.  CC: Patient Care Team: Antony Contras, MD as PCP - General (Family Medicine) Annia Belt, MD as Consulting Physician (Hematology and Oncology)   Annia Belt, MD 10/30/20172:56 PM

## 2016-10-18 NOTE — Patient Instructions (Addendum)
Continue Promacta Continue every 2 month lab checks Bone marrow biopsy at cancer center: please arrive at 8:30 AM on Thursday Nov 2 for a 9 AM procedure Return visit with Dr Darnell Level on 11/13 to discuss results

## 2016-10-20 ENCOUNTER — Other Ambulatory Visit: Payer: Self-pay | Admitting: *Deleted

## 2016-10-21 ENCOUNTER — Ambulatory Visit (HOSPITAL_BASED_OUTPATIENT_CLINIC_OR_DEPARTMENT_OTHER): Payer: Self-pay | Admitting: Oncology

## 2016-10-21 DIAGNOSIS — D649 Anemia, unspecified: Secondary | ICD-10-CM

## 2016-10-21 DIAGNOSIS — D72821 Monocytosis (symptomatic): Secondary | ICD-10-CM

## 2016-10-21 DIAGNOSIS — D693 Immune thrombocytopenic purpura: Secondary | ICD-10-CM

## 2016-10-21 DIAGNOSIS — D72819 Decreased white blood cell count, unspecified: Secondary | ICD-10-CM

## 2016-10-21 NOTE — Progress Notes (Signed)
Bone Marrow Biopsy and Aspiration Procedure Note   Informed consent was obtained and potential risks including bleeding, infection and pain were reviewed with the patient.  [place] Rule procedure followed  Posterior iliac crest(s) prepped with Betadine.  Lidocaine 2% local anesthesia infiltrated into the subcutaneous tissue.  Premedication: none  Left bone marrow biopsy and  aspirate was attempted. Despite good needle placement, I was unable to obtain an aspirate or biopsy and I aborted the procedure after 3 passes as the patient was not under sedation.  The procedure was tolerated well and there were no complications.   Indication: progressive anemia in man on long term thrombopoietin medication for chronic ITP. R/O marrow fibrosis vs new bone marrow disorder.  Physician: Kern Alberta, MD, [place]  Hematology-Oncology/Internal Medicine

## 2016-10-21 NOTE — Patient Instructions (Signed)
Bone Marrow Aspiration and Bone Marrow Biopsy Bone marrow aspiration and bone marrow biopsy are procedures that are done to diagnose blood disorders. You may also have one of these procedures to help diagnose infections or some types of cancer. Bone marrow is the soft tissue that is inside your bones. Blood cells are produced in bone marrow. For bone marrow aspiration, a sample of tissue in liquid form is removed from inside your bone. For a bone marrow biopsy, a small core of bone marrow tissue is removed. Then these samples are examined under a microscope or tested in a lab. You may need these procedures if you have an abnormal complete blood count (CBC). The aspiration or biopsy sample is usually taken from the top of your hip bone. Sometimes, an aspiration sample is taken from your chest bone (sternum). LET YOUR HEALTH CARE PROVIDER KNOW ABOUT:  Any allergies you have.  All medicines you are taking, including vitamins, herbs, eye drops, creams, and over-the-counter medicines.  Previous problems you or members of your family have had with the use of anesthetics.  Any blood disorders you have.  Previous surgeries you have had.  Any medical conditions you may have.  Whether you are pregnant or you think that you may be pregnant. RISKS AND COMPLICATIONS Generally, this is a safe procedure. However, problems may occur, including:  Infection.  Bleeding. BEFORE THE PROCEDURE  Ask your health care provider about:  Changing or stopping your regular medicines. This is especially important if you are taking diabetes medicines or blood thinners.  Taking medicines such as aspirin and ibuprofen. These medicines can thin your blood. Do not take these medicines before your procedure if your health care provider instructs you not to.  Plan to have someone take you home after the procedure.  If you go home right after the procedure, plan to have someone with you for 24 hours. PROCEDURE   An  IV tube may be inserted into one of your veins.  The injection site will be cleaned with a germ-killing solution (antiseptic).  You will be given one or more of the following:  A medicine that helps you relax (sedative).  A medicine that numbs the area (local anesthetic).  The bone marrow sample will be removed as follows:  For an aspiration, a hollow needle will be inserted through your skin and into your bone. Bone marrow fluid will be drawn up into a syringe.  For a biopsy, your health care provider will use a hollow needle to remove a core of tissue from your bone marrow.  The needle will be removed.  A bandage (dressing) will be placed over the insertion site and taped in place. The procedure may vary among health care providers and hospitals. AFTER THE PROCEDURE  Your blood pressure, heart rate, breathing rate, and blood oxygen level will be monitored often until the medicines you were given have worn off.  Return to your normal activities as directed by your health care provider.   This information is not intended to replace advice given to you by your health care provider. Make sure you discuss any questions you have with your health care provider.   Document Released: 12/09/2004 Document Revised: 04/22/2015 Document Reviewed: 11/27/2014 Elsevier Interactive Patient Education 2016 Elsevier Inc.  

## 2016-10-25 ENCOUNTER — Other Ambulatory Visit: Payer: Self-pay | Admitting: Oncology

## 2016-10-25 DIAGNOSIS — D72819 Decreased white blood cell count, unspecified: Secondary | ICD-10-CM

## 2016-10-25 DIAGNOSIS — D693 Immune thrombocytopenic purpura: Secondary | ICD-10-CM

## 2016-10-25 DIAGNOSIS — D72821 Monocytosis (symptomatic): Secondary | ICD-10-CM

## 2016-10-26 ENCOUNTER — Telehealth: Payer: Self-pay | Admitting: *Deleted

## 2016-10-26 ENCOUNTER — Encounter: Payer: Self-pay | Admitting: *Deleted

## 2016-10-26 NOTE — Telephone Encounter (Signed)
-----   Message from Annia Belt, MD sent at 10/25/2016  4:37 PM EST ----- Holley Raring - can you schedule bone marrow bx with [facility] please & call pt w date & time. I put orders in Thanks DrG

## 2016-10-26 NOTE — Telephone Encounter (Signed)
Noted - thanks DrG 

## 2016-10-26 NOTE — Telephone Encounter (Signed)
Pt called - no one answered; left message - appt for BMBx scheduled on Thursday 11/16 @ 0900 AM; arrival  time 0645 AM [facility] 1st floor radiology dept, NPO after MN and will need a driver. And call for any questions.

## 2016-11-01 ENCOUNTER — Ambulatory Visit: Payer: Commercial Managed Care - HMO | Admitting: Oncology

## 2016-11-03 ENCOUNTER — Other Ambulatory Visit: Payer: Self-pay

## 2016-11-03 ENCOUNTER — Other Ambulatory Visit: Payer: Self-pay | Admitting: Radiology

## 2016-11-03 ENCOUNTER — Other Ambulatory Visit: Payer: Self-pay | Admitting: Physician Assistant

## 2016-11-03 NOTE — Telephone Encounter (Signed)
eltrombopag (PROMACTA),  Refill request @ [place] outpatient pharmacy.

## 2016-11-03 NOTE — Telephone Encounter (Signed)
Call made to patient to see why he was requesting refills although he has additional refills available.  Pt states he was calling to inform our office that his PA was going to expire on 11/18/2016. He just wanted to make [place] aware.  Pt informed that I will address PA request prior to next refill.  Phone call complete.Despina Hidden Cassady11/15/20172:17 PM

## 2016-11-04 ENCOUNTER — Ambulatory Visit (HOSPITAL_COMMUNITY)
Admission: RE | Admit: 2016-11-04 | Discharge: 2016-11-04 | Disposition: A | Discharge status: Home / Self Care | Payer: Commercial Managed Care - HMO | Source: Ambulatory Visit | Attending: Oncology | Admitting: Oncology

## 2016-11-04 ENCOUNTER — Encounter (HOSPITAL_COMMUNITY): Payer: Self-pay

## 2016-11-04 DIAGNOSIS — D72819 Decreased white blood cell count, unspecified: Secondary | ICD-10-CM | POA: Insufficient documentation

## 2016-11-04 DIAGNOSIS — D72821 Monocytosis (symptomatic): Secondary | ICD-10-CM | POA: Insufficient documentation

## 2016-11-04 DIAGNOSIS — D693 Immune thrombocytopenic purpura: Secondary | ICD-10-CM | POA: Diagnosis not present

## 2016-11-04 DIAGNOSIS — D649 Anemia, unspecified: Secondary | ICD-10-CM | POA: Diagnosis not present

## 2016-11-04 DIAGNOSIS — D61818 Other pancytopenia: Secondary | ICD-10-CM | POA: Diagnosis not present

## 2016-11-04 DIAGNOSIS — D7589 Other specified diseases of blood and blood-forming organs: Secondary | ICD-10-CM | POA: Diagnosis not present

## 2016-11-04 LAB — CBC WITH DIFFERENTIAL/PLATELET
Basophils Absolute: 0 10*3/uL (ref 0.0–0.1)
Basophils Relative: 0 %
EOS ABS: 0 10*3/uL (ref 0.0–0.7)
EOS PCT: 0 %
HCT: 34.6 % — ABNORMAL LOW (ref 39.0–52.0)
HEMOGLOBIN: 11.2 g/dL — AB (ref 13.0–17.0)
LYMPHS ABS: 0.8 10*3/uL (ref 0.7–4.0)
Lymphocytes Relative: 30 %
MCH: 29.9 pg (ref 26.0–34.0)
MCHC: 32.4 g/dL (ref 30.0–36.0)
MCV: 92.3 fL (ref 78.0–100.0)
MONO ABS: 0.8 10*3/uL (ref 0.1–1.0)
MONOS PCT: 30 %
NEUTROS PCT: 40 %
Neutro Abs: 1.1 10*3/uL — ABNORMAL LOW (ref 1.7–7.7)
Platelets: 53 10*3/uL — ABNORMAL LOW (ref 150–400)
RBC: 3.75 MIL/uL — ABNORMAL LOW (ref 4.22–5.81)
RDW: 16.1 % — AB (ref 11.5–15.5)
WBC: 2.8 10*3/uL — ABNORMAL LOW (ref 4.0–10.5)

## 2016-11-04 LAB — BASIC METABOLIC PANEL
Anion gap: 6 (ref 5–15)
BUN: 11 mg/dL (ref 6–20)
CALCIUM: 8.9 mg/dL (ref 8.9–10.3)
CHLORIDE: 107 mmol/L (ref 101–111)
CO2: 26 mmol/L (ref 22–32)
CREATININE: 0.84 mg/dL (ref 0.61–1.24)
GFR calc Af Amer: >60 mL/min (ref >60)
GFR calc non Af Amer: >60 mL/min (ref >60)
GLUCOSE: 107 mg/dL — AB (ref 65–99)
Potassium: 4.1 mmol/L (ref 3.5–5.1)
Sodium: 139 mmol/L (ref 135–145)

## 2016-11-04 LAB — PROTIME-INR
INR: 1.13
PROTHROMBIN TIME: 14.6 s (ref 11.4–15.2)

## 2016-11-04 LAB — BONE MARROW EXAM

## 2016-11-04 MED ORDER — MIDAZOLAM HCL 2 MG/2ML IJ SOLN
INTRAMUSCULAR | Status: AC
  Filled 2016-11-04: qty 2

## 2016-11-04 MED ORDER — SODIUM CHLORIDE 0.9 % IV SOLN
INTRAVENOUS | Status: DC
  Administered 2016-11-04: 08:00:00 via INTRAVENOUS

## 2016-11-04 MED ORDER — FENTANYL CITRATE (PF) 100 MCG/2ML IJ SOLN
INTRAMUSCULAR | Status: AC | PRN
  Administered 2016-11-04: 50 ug via INTRAVENOUS

## 2016-11-04 MED ORDER — FENTANYL CITRATE (PF) 100 MCG/2ML IJ SOLN
INTRAMUSCULAR | Status: AC
  Filled 2016-11-04: qty 2

## 2016-11-04 MED ORDER — FENTANYL CITRATE (PF) 100 MCG/2ML IJ SOLN
INTRAMUSCULAR | Status: AC
  Filled 2016-11-04: qty 4

## 2016-11-04 MED ORDER — ONDANSETRON HCL 4 MG/2ML IJ SOLN
INTRAMUSCULAR | Status: AC
  Filled 2016-11-04: qty 2

## 2016-11-04 MED ORDER — MIDAZOLAM HCL 2 MG/2ML IJ SOLN
INTRAMUSCULAR | Status: AC
  Filled 2016-11-04: qty 6

## 2016-11-04 MED ORDER — MIDAZOLAM HCL 2 MG/2ML IJ SOLN
INTRAMUSCULAR | Status: AC | PRN
  Administered 2016-11-04 (×3): 1 mg via INTRAVENOUS

## 2016-11-04 NOTE — H&P (Signed)
Chief Complaint: Patient was seen in consultation today for bone marrow biopsy at the request of [place] M  Referring Physician(s): [place] M  Supervising Physician: Aletta Edouard  Patient Status: [facility] - In-pt  History of Present Illness: Billy [name] is a 75 y.o. male with chronic ITP and anemia. He is referred for bone marrow biopsy PMHx, meds, labs, allergies reviewed. Apparentl;y, BM bx was attempted at cancer center but unsuccessful. Has been NPO today Wife at bedside  Past Medical History:  Diagnosis Date  . Abscess    on chest  . 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 Clotting disorder ([place])   . Hypertension   . Leukopenia 10/17/2012    Past Surgical History:  Procedure Laterality Date  . APPENDECTOMY  1955  . CYSTECTOMY  2000   left chest wall  . Deep excision of left anterior chest wall mass.  2005   ? infected seb cyst    Allergies: Penicillins  Medications: Prior to Admission medications   Medication Sig Start Date End Date Taking? Authorizing Provider  amLODipine (NORVASC) 5 MG tablet  02/22/12  Yes Historical Provider, MD  eltrombopag (PROMACTA) 50 MG tablet Take 1 tablet (50 mg total) by mouth daily. Take on an empty stomach 1 hour before a meal or 2 hours after 04/06/16  Yes Annia Belt, MD  metoprolol succinate (TOPROL-XL) 100 MG 24 hr tablet Take 100 mg by mouth daily.  02/22/12  Yes Historical Provider, MD     Family History  Problem Relation Age of Onset  . Heart disease Father   . Cancer Brother     bone and liver    Social History   Social History  . Marital status: Married    Spouse name: N/A  . Number of children: N/A  . Years of education: N/A   Occupational History  . Chiropractor     semi-retired 2013   Social History Main Topics  . Smoking status: Never Smoker  . Smokeless tobacco: Never Used  . Alcohol use 0.0 oz/week   Comment: 3 times a week.  . Drug use: No  . Sexual activity: Not Asked   Other Topics Concern  . None   Social History Narrative  . None    Review of Systems: A 12 point ROS discussed and pertinent positives are indicated in the HPI above.  All other systems are negative.  Review of Systems  Vital Signs: BP (!) 159/75 (BP Location: Right Arm)   Pulse 65   Temp 97.9 F (36.6 C) (Oral)   Resp 18   SpO2 98%   Physical Exam  Constitutional: He is oriented to person, place, and time. He appears well-developed and well-nourished. No distress.  HENT:  Head: Normocephalic.  Mouth/Throat: Oropharynx is clear and moist.  Neck: Normal range of motion. No tracheal deviation present.  Cardiovascular: Normal rate, regular rhythm and normal heart sounds.   Pulmonary/Chest: Breath sounds normal. No respiratory distress. He has no wheezes. He has no rales.  Neurological: He is alert and oriented to person, place, and time.  Skin: Skin is warm and dry.  Psychiatric: He has a normal mood and affect. Judgment normal.    Mallampati Score:  MD Evaluation Airway: WNL Heart: WNL Abdomen: WNL Chest/ Lungs: WNL ASA  Classification: 2 Mallampati/Airway Score: One  Imaging: No results found.  Labs:  CBC:  Recent Labs  06/15/16 1524 08/10/16 1512 10/05/16 1504 11/04/16 0719  WBC 3.0* 3.4* 3.4* 2.8*  HGB 12.4* 11.3* 11.5* 11.2*  HCT 37.3* 34.5* 35.2* 34.6*  PLT 68* 69 Large platelets present* 52 Large & giant platelets* 53*    COAGS:  Recent Labs  11/04/16 0719  INR 1.13    BMP:  Recent Labs  12/30/15 1040 02/24/16 1507 06/15/16 1524 11/04/16 0719  NA 142 142 142 139  K 4.0 4.0 4.2 4.1  CL  --   --   --  107  CO2 '28 28 27 26  '$ GLUCOSE 129 133 107 107*  BUN 10.9 13.7 14.8 11  CALCIUM 9.2 9.1 9.2 8.9  CREATININE 1.0 1.2 1.0 0.84  GFRNONAA  --   --   --  >60  GFRAA  --   --   --  >60    LIVER FUNCTION TESTS:  Recent Labs  12/30/15 1040 02/24/16 1507  06/15/16 1524  BILITOT 0.33 0.72 0.50  AST '22 19 23  '$ ALT '27 20 21  '$ ALKPHOS 53 47 48  PROT 7.0 6.8 6.7  ALBUMIN 3.4* 3.6 3.6    TUMOR MARKERS: No results for input(s): AFPTM, CEA, CA199, CHROMGRNA in the last 8760 hours.  Assessment and Plan: Chronic ITP Pancytopenia For CT guided Bone marrow biopsy Labs reviewed. Risks and Benefits discussed with the patient including, but not limited to bleeding, infection, damage to adjacent structures or low yield requiring additional tests. All of the patient's questions were answered, patient is agreeable to proceed. Consent signed and in chart.    Thank you for this interesting consult.  I greatly enjoyed meeting Billy [name] and look forward to participating in their care.  A copy of this report was sent to the requesting provider on this date.  Electronically Signed: Ascencion Dike 11/04/2016, 8:40 AM   I spent a total of 20 minutes in face to face in clinical consultation, greater than 50% of which was counseling/coordinating care for bone marrow biopsy

## 2016-11-04 NOTE — Procedures (Signed)
Interventional Radiology Procedure Note  Procedure: CT guided aspirate and core biopsy of right iliac bone Complications: None Recommendations: - Bedrest supine x 1 hrs - Follow biopsy results  Afia Messenger T. Marrah Vanevery, M.D Pager:  [phone]   

## 2016-11-04 NOTE — Discharge Instructions (Signed)
Moderate Conscious Sedation, Adult, Care After These instructions provide you with information about caring for yourself after your procedure. Your health care provider may also give you more specific instructions. Your treatment has been planned according to current medical practices, but problems sometimes occur. Call your health care provider if you have any problems or questions after your procedure. What can I expect after the procedure? After your procedure, it is common:  To feel sleepy for several hours.  To feel clumsy and have poor balance for several hours.  To have poor judgment for several hours.  To vomit if you eat too soon. Follow these instructions at home: For at least 24 hours after the procedure:   Do not:  Participate in activities where you could fall or become injured.  Drive.  Use heavy machinery.  Drink alcohol.  Take sleeping pills or medicines that cause drowsiness.  Make important decisions or sign legal documents.  Take care of children on your own.  Rest. Eating and drinking  Follow the diet recommended by your health care provider.  If you vomit:  Drink water, juice, or soup when you can drink without vomiting.  Make sure you have little or no nausea before eating solid foods. General instructions  Have a responsible adult stay with you until you are awake and alert.  Take over-the-counter and prescription medicines only as told by your health care provider.  If you smoke, do not smoke without supervision.  Keep all follow-up visits as told by your health care provider. This is important. Contact a health care provider if:  You keep feeling nauseous or you keep vomiting.  You feel light-headed.  You develop a rash.  You have a fever. Get help right away if:  You have trouble breathing. This information is not intended to replace advice given to you by your health care provider. Make sure you discuss any questions you have  with your health care provider. Document Released: 09/26/2013 Document Revised: 05/10/2016 Document Reviewed: 03/27/2016 Elsevier Interactive Patient Education  2017 [place].   Bone Marrow Aspiration and Bone Marrow Biopsy, Adult, Care After This sheet gives you information about how to care for yourself after your procedure. Your health care provider may also give you more specific instructions. If you have problems or questions, contact your health care provider. What can I expect after the procedure? After the procedure, it is common to have:  Mild pain and tenderness.  Swelling.  Bruising. Follow these instructions at home:  Take over-the-counter or prescription medicines only as told by your health care provider.  Do not take baths, swim, or use a hot tub until your health care provider approves. Ask if you can take a shower or have a sponge bath.  Follow instructions from your health care provider about how to take care of the puncture site. Make sure you:  Wash your hands with soap and water before you change your bandage (dressing). If soap and water are not available, use hand sanitizer.  Change your dressing as told by your health care provider.  Check your puncture siteevery day for signs of infection. Check for:  More redness, swelling, or pain.  More fluid or blood.  Warmth.  Pus or a bad smell.  Return to your normal activities as told by your health care provider. Ask your health care provider what activities are safe for you.  Do not drive for 24 hours if you were given a medicine to help you relax (sedative).  you relax (sedative). °· Keep all follow-up visits as told by your health care provider. This is important. °Contact a health care provider if: °· You have more redness, swelling, or pain around the puncture site. °· You have more fluid or blood coming from the puncture site. °· Your puncture site feels warm to the touch. °· You have pus or a bad smell coming from  the puncture site. °· You have a fever. °· Your pain is not controlled with medicine. °This information is not intended to replace advice given to you by your health care provider. Make sure you discuss any questions you have with your health care provider. °Document Released: 06/25/2005 Document Revised: 06/25/2016 Document Reviewed: 05/19/2016 °Elsevier Interactive Patient Education © 2017 [employer]vier Inc. ° °

## 2016-11-08 ENCOUNTER — Other Ambulatory Visit: Payer: Self-pay | Admitting: Oncology

## 2016-11-08 ENCOUNTER — Encounter: Payer: Self-pay | Admitting: Oncology

## 2016-11-08 DIAGNOSIS — D7581 Myelofibrosis: Secondary | ICD-10-CM

## 2016-11-08 NOTE — Progress Notes (Unsigned)
I called the patient to discuss bone marrow results. I attempted to do the bone marrow myself without sedation. On 3 attempts I was unable to obtain an aspirate or a biopsy. I scheduled the patient to have the procedure done with sedation by the interventional radiologist. They were also unsuccessful in getting an aspirate, which would be compatible with myelofibrosis, but they did get a good core biopsy. I discussed the results with our pathologist. There are signs of a developing myeloproliferative disorder with abnormal megakaryocytes with clustering. Hypercellular marrow. Suggestion of fibrosis but reticulin and collagen stains are pending. Cytogenetic studies pending. Pathologist suggests checking for gene mutations related to MPD's. I will start with the most common JAK-2 then reflexed to Calreticulin and mpl. I am going to have the patient discontinue Promacta as of November 26. Put him back on weekly CBCs. If platelet count falls below 30,000, I will need to come up with an alternative therapy. There has been a renewed interest in using danazol and I might try that. He has failed Rituxan in the past. Splenectomy would be the next most reasonable option. If the changes of myelofibrosis are secondary to long-term use of the thrombopoietin agonists, they should be reversible when the drug is stopped. Some new data suggests that 30-50% of people who have achieved a remission on the TPA agonists will maintain the response when the drugs are stopped. I hope this occurs for this patient.

## 2016-11-09 ENCOUNTER — Other Ambulatory Visit (INDEPENDENT_AMBULATORY_CARE_PROVIDER_SITE_OTHER): Payer: Commercial Managed Care - HMO

## 2016-11-09 DIAGNOSIS — D7581 Myelofibrosis: Secondary | ICD-10-CM | POA: Diagnosis not present

## 2016-11-09 DIAGNOSIS — D693 Immune thrombocytopenic purpura: Secondary | ICD-10-CM

## 2016-11-09 DIAGNOSIS — D72819 Decreased white blood cell count, unspecified: Secondary | ICD-10-CM | POA: Diagnosis not present

## 2016-11-09 DIAGNOSIS — D72821 Monocytosis (symptomatic): Secondary | ICD-10-CM | POA: Diagnosis not present

## 2016-11-10 LAB — COMPREHENSIVE METABOLIC PANEL WITH GFR
ALT: 15 IU/L (ref 0–44)
AST: 19 IU/L (ref 0–40)
Albumin/Globulin Ratio: 1.4 (ref 1.2–2.2)
Albumin: 3.6 g/dL (ref 3.5–4.8)
Alkaline Phosphatase: 49 IU/L (ref 39–117)
BUN/Creatinine Ratio: 14 (ref 10–24)
BUN: 12 mg/dL (ref 8–27)
Bilirubin Total: 0.3 mg/dL (ref 0.0–1.2)
CO2: 26 mmol/L (ref 18–29)
Calcium: 9 mg/dL (ref 8.6–10.2)
Chloride: 104 mmol/L (ref 96–106)
Creatinine, Ser: 0.85 mg/dL (ref 0.76–1.27)
GFR calc Af Amer: 100 mL/min/1.73
GFR calc non Af Amer: 86 mL/min/1.73
Globulin, Total: 2.5 g/dL (ref 1.5–4.5)
Glucose: 105 mg/dL — ABNORMAL HIGH (ref 65–99)
Potassium: 4.3 mmol/L (ref 3.5–5.2)
Sodium: 144 mmol/L (ref 134–144)
Total Protein: 6.1 g/dL (ref 6.0–8.5)

## 2016-11-10 LAB — CBC WITH DIFFERENTIAL/PLATELET
BASOS: 0 %
Basophils Absolute: 0 10*3/uL (ref 0.0–0.2)
EOS (ABSOLUTE): 0 10*3/uL (ref 0.0–0.4)
EOS: 1 %
HEMATOCRIT: 33 % — AB (ref 37.5–51.0)
HEMOGLOBIN: 10.7 g/dL — AB (ref 12.6–17.7)
LYMPHS ABS: 0.9 10*3/uL (ref 0.7–3.1)
Lymphs: 27 %
MCH: 29.6 pg (ref 26.6–33.0)
MCHC: 32.4 g/dL (ref 31.5–35.7)
MCV: 91 fL (ref 79–97)
MONOS ABS: 0.6 10*3/uL (ref 0.1–0.9)
Monocytes: 20 %
NEUTROS ABS: 1.1 10*3/uL — AB (ref 1.4–7.0)
NRBC: 1 % — ABNORMAL HIGH (ref 0–0)
Neutrophils: 34 %
Platelets: 55 10*3/uL — CL (ref 150–379)
RBC: 3.62 x10E6/uL — ABNORMAL LOW (ref 4.14–5.80)
RDW: 16.3 % — AB (ref 12.3–15.4)
WBC: 3.2 10*3/uL — ABNORMAL LOW (ref 3.4–10.8)

## 2016-11-10 LAB — IMMATURE CELLS
Bands(Auto) Relative: 1 %
Blasts/blast like cells: 2 % — ABNORMAL HIGH
MYELOCYTES: 3 % — ABNORMAL HIGH
Metamyelocytes: 12 % — ABNORMAL HIGH

## 2016-11-15 ENCOUNTER — Telehealth: Payer: Self-pay | Admitting: *Deleted

## 2016-11-15 LAB — JAK2 GENOTYPR

## 2016-11-15 NOTE — Telephone Encounter (Signed)
Received call from pt stating his current  PA will expire on 11/18/2016 for the Promacta 50mg .  Call made to [facility] to initiate PA.  Request sent for review. 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

## 2016-11-16 ENCOUNTER — Telehealth: Payer: Self-pay | Admitting: *Deleted

## 2016-11-16 LAB — CHROMOSOME ANALYSIS, BONE MARROW

## 2016-11-16 NOTE — Telephone Encounter (Signed)
-----   Message from Annia Belt, MD sent at 11/10/2016  5:42 PM EST ----- Call [phone] count 55,000 on 11/21

## 2016-11-16 NOTE — Telephone Encounter (Signed)
Pt returned my call - informed "platelet count 55,000 on 11/21" per Dr Beryle Beams. Also stated he completed Promacta on Sunday and needs a lab appt. Appt scheduled for Monday 12/4 at 0930 AM.

## 2016-11-16 NOTE — Telephone Encounter (Signed)
Pt called - no one answered and voice mailbox has not been set-up.

## 2016-11-19 ENCOUNTER — Other Ambulatory Visit: Payer: Self-pay | Admitting: Oncology

## 2016-11-19 DIAGNOSIS — D72821 Monocytosis (symptomatic): Secondary | ICD-10-CM

## 2016-11-19 DIAGNOSIS — D693 Immune thrombocytopenic purpura: Secondary | ICD-10-CM

## 2016-11-19 DIAGNOSIS — D72819 Decreased white blood cell count, unspecified: Secondary | ICD-10-CM

## 2016-11-19 DIAGNOSIS — D7581 Myelofibrosis: Secondary | ICD-10-CM

## 2016-11-22 ENCOUNTER — Encounter (INDEPENDENT_AMBULATORY_CARE_PROVIDER_SITE_OTHER): Payer: Self-pay

## 2016-11-22 ENCOUNTER — Encounter: Payer: Self-pay | Admitting: Oncology

## 2016-11-22 ENCOUNTER — Other Ambulatory Visit (INDEPENDENT_AMBULATORY_CARE_PROVIDER_SITE_OTHER): Payer: Commercial Managed Care - HMO

## 2016-11-22 DIAGNOSIS — D72819 Decreased white blood cell count, unspecified: Secondary | ICD-10-CM

## 2016-11-22 DIAGNOSIS — D7581 Myelofibrosis: Secondary | ICD-10-CM | POA: Diagnosis not present

## 2016-11-22 DIAGNOSIS — D693 Immune thrombocytopenic purpura: Secondary | ICD-10-CM

## 2016-11-22 DIAGNOSIS — D72821 Monocytosis (symptomatic): Secondary | ICD-10-CM

## 2016-11-22 LAB — CBC WITH DIFFERENTIAL/PLATELET
BASOS ABS: 0 10*3/uL (ref 0.0–0.1)
Basophils Relative: 2 %
EOS ABS: 0 10*3/uL (ref 0.0–0.7)
EOS PCT: 0 %
HCT: 34.8 % — ABNORMAL LOW (ref 39.0–52.0)
HEMOGLOBIN: 11.4 g/dL — AB (ref 13.0–17.0)
LYMPHS ABS: 0.7 10*3/uL (ref 0.7–4.0)
Lymphocytes Relative: 30 %
MCH: 30.1 pg (ref 26.0–34.0)
MCHC: 32.8 g/dL (ref 30.0–36.0)
MCV: 91.8 fL (ref 78.0–100.0)
MONO ABS: 0.6 10*3/uL (ref 0.1–1.0)
Monocytes Relative: 24 %
NEUTROS PCT: 44 %
Neutro Abs: 1.1 10*3/uL — ABNORMAL LOW (ref 1.7–7.7)
Platelets: 59 10*3/uL — ABNORMAL LOW (ref 150–400)
RBC: 3.79 MIL/uL — AB (ref 4.22–5.81)
RDW: 16.3 % — AB (ref 11.5–15.5)
WBC: 2.4 10*3/uL — AB (ref 4.0–10.5)

## 2016-11-23 ENCOUNTER — Encounter (HOSPITAL_COMMUNITY): Payer: Self-pay

## 2016-11-23 ENCOUNTER — Encounter: Payer: Commercial Managed Care - HMO | Admitting: Oncology

## 2016-11-30 ENCOUNTER — Other Ambulatory Visit (HOSPITAL_BASED_OUTPATIENT_CLINIC_OR_DEPARTMENT_OTHER): Payer: Commercial Managed Care - HMO

## 2016-11-30 DIAGNOSIS — D693 Immune thrombocytopenic purpura: Secondary | ICD-10-CM | POA: Diagnosis not present

## 2016-11-30 DIAGNOSIS — D72819 Decreased white blood cell count, unspecified: Secondary | ICD-10-CM

## 2016-11-30 DIAGNOSIS — D72821 Monocytosis (symptomatic): Secondary | ICD-10-CM

## 2016-11-30 LAB — CBC WITH DIFFERENTIAL/PLATELET
BASO%: 0.4 % (ref 0.0–2.0)
BASOS ABS: 0 10*3/uL (ref 0.0–0.1)
EOS ABS: 0 10*3/uL (ref 0.0–0.5)
EOS%: 0.4 % (ref 0.0–7.0)
HEMATOCRIT: 33.9 % — AB (ref 38.4–49.9)
HEMOGLOBIN: 10.9 g/dL — AB (ref 13.0–17.1)
LYMPH#: 0.7 10*3/uL — AB (ref 0.9–3.3)
LYMPH%: 29.8 % (ref 14.0–49.0)
MCH: 29.6 pg (ref 27.2–33.4)
MCHC: 32.2 g/dL (ref 32.0–36.0)
MCV: 92.1 fL (ref 79.3–98.0)
MONO#: 0.8 10*3/uL (ref 0.1–0.9)
MONO%: 32.7 % — AB (ref 0.0–14.0)
NEUT%: 36.7 % — ABNORMAL LOW (ref 39.0–75.0)
NEUTROS ABS: 0.9 10*3/uL — AB (ref 1.5–6.5)
NRBC: 3 % — AB (ref 0–0)
PLATELETS: 47 10*3/uL — AB (ref 140–400)
RBC: 3.68 10*6/uL — AB (ref 4.20–5.82)
RDW: 16.2 % — AB (ref 11.0–14.6)
WBC: 2.5 10*3/uL — AB (ref 4.0–10.3)

## 2016-11-30 LAB — TECHNOLOGIST REVIEW

## 2016-12-02 LAB — CALRETICULIN (CALR) MUTATION ANALYSIS

## 2016-12-02 LAB — MPL MUTATION ANALYSIS

## 2016-12-06 ENCOUNTER — Encounter: Payer: Self-pay | Admitting: Oncology

## 2016-12-06 ENCOUNTER — Ambulatory Visit (INDEPENDENT_AMBULATORY_CARE_PROVIDER_SITE_OTHER): Payer: Commercial Managed Care - HMO | Admitting: Oncology

## 2016-12-06 VITALS — BP 158/72 | HR 76 | Temp 97.9°F | Ht 71.0 in | Wt 200.9 lb

## 2016-12-06 DIAGNOSIS — D693 Immune thrombocytopenic purpura: Secondary | ICD-10-CM

## 2016-12-06 DIAGNOSIS — D72821 Monocytosis (symptomatic): Secondary | ICD-10-CM

## 2016-12-06 DIAGNOSIS — Z23 Encounter for immunization: Secondary | ICD-10-CM

## 2016-12-06 DIAGNOSIS — D72819 Decreased white blood cell count, unspecified: Secondary | ICD-10-CM

## 2016-12-06 DIAGNOSIS — Z880 Allergy status to penicillin: Secondary | ICD-10-CM | POA: Diagnosis not present

## 2016-12-06 LAB — CBC WITH DIFFERENTIAL/PLATELET
BAND NEUTROPHILS: 0 %
BASOS PCT: 2 %
BLASTS: 0 %
Basophils Absolute: 0.1 10*3/uL (ref 0.0–0.1)
EOS ABS: 0 10*3/uL (ref 0.0–0.7)
Eosinophils Relative: 0 %
HCT: 35.4 % — ABNORMAL LOW (ref 39.0–52.0)
Hemoglobin: 11.2 g/dL — ABNORMAL LOW (ref 13.0–17.0)
LYMPHS PCT: 29 %
Lymphs Abs: 0.8 10*3/uL (ref 0.7–4.0)
MCH: 29.4 pg (ref 26.0–34.0)
MCHC: 31.6 g/dL (ref 30.0–36.0)
MCV: 92.9 fL (ref 78.0–100.0)
MONO ABS: 1 10*3/uL (ref 0.1–1.0)
MONOS PCT: 33 %
Metamyelocytes Relative: 0 %
Myelocytes: 0 %
NEUTROS ABS: 1 10*3/uL — AB (ref 1.7–7.7)
Neutrophils Relative %: 36 %
OTHER: 0 %
PLATELETS: 45 10*3/uL — AB (ref 150–400)
Promyelocytes Absolute: 0 %
RBC: 3.81 MIL/uL — ABNORMAL LOW (ref 4.22–5.81)
RDW: 16.1 % — ABNORMAL HIGH (ref 11.5–15.5)
WBC: 2.9 10*3/uL — ABNORMAL LOW (ref 4.0–10.5)
nRBC: 0 /100 WBC

## 2016-12-06 LAB — SAVE SMEAR

## 2016-12-06 NOTE — Patient Instructions (Signed)
To lab today for CBC Continue Q 2 week standing order for CBCs Pneumonia, meningitis, Hemophilus vaccines today Return visit 6-8 weeks

## 2016-12-07 ENCOUNTER — Telehealth: Payer: Self-pay | Admitting: *Deleted

## 2016-12-07 NOTE — Telephone Encounter (Signed)
Pt called / informed "platelets about the same as last week @ 45,000. OK to just keep watching" per Dr Beryle Beams.

## 2016-12-07 NOTE — Telephone Encounter (Signed)
-----   Message from Annia Belt, MD sent at 12/06/2016  5:53 PM EST ----- Call pt: platelets about the same as last week @ 45,000. OK to just keep watching.

## 2016-12-07 NOTE — Telephone Encounter (Signed)
Pt called - no answer and unable to leave message; VM has not been set up.

## 2016-12-07 NOTE — Progress Notes (Signed)
Hematology and Oncology Follow Up Visit  Billy [name] [id number] 1941-07-19 75 y.o. 12/07/2016 11:01 AM   Principle Diagnosis: Encounter Diagnoses  Name Primary?  . Chronic ITP (idiopathic thrombocytopenia) (HCC) Yes  . Leukopenia, unspecified type   . 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 See prior notes for details. Over the last year, hemoglobin has drifted down. I was concerned with possibility that the meds were causing marrow fibrosis. Bone marrow aspiration by myself & Radiology yielded a "dry Tap". Biopsy sections from 11/04/16 marrow showed a hypercellular marrow with abnormal megakaryocytes and presence of fibrosis.   Interim History: Short interval follow up visit to discuss results of recent bone marrow biopsy and ancillary studies. Cytogenetic studies on bone marrow showed normal chromosomes. Unfortunately, sample not processed for FISH. 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 This is confusing to me. This is the gene that programs the thrombopoietin receptor. He has been on TPA agonist therapy with a good repsonse.  Now off Promacta for 3 weeks. Stopped 11/16/16. Platelet count slightly less then when on drug at 45,000 today. Recent update on patients on long term Rx with TPA agonists shows about half the patients continue to maintain a response when drug stopped. Although this might reflect the natural history of ITP with occurrence of spontaneous remissions over time, it is encouraging that some patients can stop. He remains clinically stable. No bleeding. No further fall in hemoglobin.  Medications: reviewed  Allergies:  Allergies  Allergen Reactions  . Penicillins Other (See Comments)    Doesn't remember was infant    Review of  Systems: See interim history Remaining ROS negative:   Physical Exam: Blood pressure (!) 158/72, pulse 76, temperature 97.9 F (36.6 C), temperature source Oral, height '5\' 11"'$  (1.803 m), weight 200 lb 14.4 oz (91.1 kg), SpO2 98 %. Wt Readings from Last 3 Encounters:  12/06/16 200 lb 14.4 oz (91.1 kg)  10/18/16 199 lb 3.2 oz (90.4 kg)  04/06/16 207 lb 14.4 oz (94.3 kg)     General appearance: well nourished Caucasian man [name]: Pharynx no erythema, exudate, mass, or ulcer. No thyromegaly or thyroid nodules Lymph nodes: No cervical, supraclavicular, or axillary lymphadenopathy Breasts: Lungs: Clear to auscultation, resonant to percussion throughout Heart: Regular rhythm, no murmur, no gallop, no rub, no click, no edema Abdomen: Soft, nontender, normal bowel sounds, no mass, no organomegaly Extremities: No edema, no calf tenderness Musculoskeletal: no joint deformities GU:  Vascular:  Neurologic: Alert, oriented, PERRLA,  cranial nerves grossly normal, motor strength 5 over 5, reflexes 1+ symmetric, upper body coordination normal, gait normal, Skin: No rash or ecchymosis  Lab Results: CBC W/Diff    Component Value Date/Time   WBC 2.9 (L) 12/06/2016 1049   RBC 3.81 (L) 12/06/2016 1049   HGB 11.2 (L) 12/06/2016 1049   HGB 10.9 (L) 11/30/2016 1520   HCT 35.4 (L) 12/06/2016 1049   HCT 33.9 (L) 11/30/2016 1520   PLT 45 (L) 12/06/2016 1049   PLT 47 (L) 11/30/2016 1520   PLT 55 (LL) 11/09/2016 1202   MCV 92.9 12/06/2016 1049   MCV 92.1 11/30/2016 1520   MCH 29.4 12/06/2016 1049   MCHC 31.6 12/06/2016 1049   RDW 16.1 (H) 12/06/2016 1049   RDW 16.2 (H) 11/30/2016 1520   LYMPHSABS 0.8 12/06/2016 1049  LYMPHSABS 0.7 (L) 11/30/2016 1520   MONOABS 1.0 12/06/2016 1049   MONOABS 0.8 11/30/2016 1520   EOSABS 0.0 12/06/2016 1049   EOSABS 0.0 11/30/2016 1520   EOSABS 0.0 11/09/2016 1202   BASOSABS 0.1 12/06/2016 1049   BASOSABS 0.0 11/30/2016 1520     Chemistry      Component  Value Date/Time   NA 144 11/09/2016 1202   NA 142 06/15/2016 1524   K 4.3 11/09/2016 1202   K 4.2 06/15/2016 1524   CL 104 11/09/2016 1202   CL 106 05/31/2013 0920   CO2 26 11/09/2016 1202   CO2 27 06/15/2016 1524   BUN 12 11/09/2016 1202   BUN 14.8 06/15/2016 1524   CREATININE 0.85 11/09/2016 1202   CREATININE 1.0 06/15/2016 1524      Component Value Date/Time   CALCIUM 9.0 11/09/2016 1202   CALCIUM 9.2 06/15/2016 1524   ALKPHOS 49 11/09/2016 1202   ALKPHOS 48 06/15/2016 1524   AST 19 11/09/2016 1202   AST 23 06/15/2016 1524   ALT 15 11/09/2016 1202   ALT 21 06/15/2016 1524   BILITOT 0.3 11/09/2016 1202   BILITOT 0.50 06/15/2016 1524       Radiological Studies: No results found.  Impression:  #1. ITP Holding TPA agonists in view of developing myelofibrosis: drug toxicity that has been described with this class of drugs. Rare mpl gene mutation detected seen in about 1-2% of patients with primary myelofibrosis so not clear if this has developed independent of drug therapy.  For now, continue observation off growth factors. If platelet count falls below 30,000, will need to consider other Rx options. We discussed splenectomy or a trial of danazol. He failed steroids & Rituxan in the past. Other novel agents in development but not yet approved for general use. He received Pneumonia 13 vaccine today. He will return for meningococcal & H Influenzae vaccines in anticipation of possible splenectomy in near future.  #2. Idiopathic monocytosis with no other signs of MDS in current marrow but in view of dry tap, interpretation limited.   CC: Patient Care Team: Antony Contras, MD as PCP - General (Family Medicine) Annia Belt, MD as Consulting Physician (Hematology and Oncology)   Annia Belt, MD 12/19/201711:01 AM

## 2016-12-14 DIAGNOSIS — R05 Cough: Secondary | ICD-10-CM | POA: Diagnosis not present

## 2016-12-21 ENCOUNTER — Other Ambulatory Visit (INDEPENDENT_AMBULATORY_CARE_PROVIDER_SITE_OTHER): Payer: Commercial Managed Care - HMO

## 2016-12-21 DIAGNOSIS — D693 Immune thrombocytopenic purpura: Secondary | ICD-10-CM | POA: Diagnosis not present

## 2016-12-21 DIAGNOSIS — D72819 Decreased white blood cell count, unspecified: Secondary | ICD-10-CM

## 2016-12-21 DIAGNOSIS — D72821 Monocytosis (symptomatic): Secondary | ICD-10-CM

## 2016-12-21 LAB — CBC WITH DIFFERENTIAL/PLATELET
BASOS PCT: 1 %
Basophils Absolute: 0 10*3/uL (ref 0.0–0.1)
EOS ABS: 0 10*3/uL (ref 0.0–0.7)
EOS PCT: 1 %
HCT: 34.1 % — ABNORMAL LOW (ref 39.0–52.0)
HEMOGLOBIN: 10.6 g/dL — AB (ref 13.0–17.0)
LYMPHS ABS: 1 10*3/uL (ref 0.7–4.0)
Lymphocytes Relative: 30 %
MCH: 29.4 pg (ref 26.0–34.0)
MCHC: 31.1 g/dL (ref 30.0–36.0)
MCV: 94.5 fL (ref 78.0–100.0)
MONOS PCT: 30 %
Monocytes Absolute: 1 10*3/uL (ref 0.1–1.0)
Neutro Abs: 1.3 10*3/uL — ABNORMAL LOW (ref 1.7–7.7)
Neutrophils Relative %: 39 %
PLATELETS: 52 10*3/uL — AB (ref 150–400)
RBC: 3.61 MIL/uL — ABNORMAL LOW (ref 4.22–5.81)
RDW: 15.7 % — AB (ref 11.5–15.5)
WBC: 3.4 10*3/uL — ABNORMAL LOW (ref 4.0–10.5)

## 2016-12-22 ENCOUNTER — Telehealth: Payer: Self-pay | Admitting: *Deleted

## 2016-12-22 NOTE — Telephone Encounter (Signed)
Pt called / informed "platelets stable at 52,000. Hb 10.6 - within range of other recent values" per Dr Beryle Beams.

## 2016-12-22 NOTE — Telephone Encounter (Signed)
-----   Message from Annia Belt, MD sent at 12/21/2016  4:47 PM EST ----- Call pt: platelets stable at 52,000. Hb 10.6 - within range of other recent values

## 2017-01-04 ENCOUNTER — Other Ambulatory Visit (INDEPENDENT_AMBULATORY_CARE_PROVIDER_SITE_OTHER): Payer: Medicare HMO

## 2017-01-04 DIAGNOSIS — D693 Immune thrombocytopenic purpura: Secondary | ICD-10-CM

## 2017-01-04 DIAGNOSIS — D72821 Monocytosis (symptomatic): Secondary | ICD-10-CM

## 2017-01-04 DIAGNOSIS — D72819 Decreased white blood cell count, unspecified: Secondary | ICD-10-CM

## 2017-01-05 LAB — CBC WITH DIFFERENTIAL/PLATELET
BASOS ABS: 0 10*3/uL (ref 0.0–0.1)
BASOS PCT: 0 %
EOS ABS: 0 10*3/uL (ref 0.0–0.7)
Eosinophils Relative: 0 %
HCT: 35.8 % — ABNORMAL LOW (ref 39.0–52.0)
HEMOGLOBIN: 11.4 g/dL — AB (ref 13.0–17.0)
LYMPHS PCT: 34 %
Lymphs Abs: 0.9 10*3/uL (ref 0.7–4.0)
MCH: 29.8 pg (ref 26.0–34.0)
MCHC: 31.8 g/dL (ref 30.0–36.0)
MCV: 93.5 fL (ref 78.0–100.0)
MONO ABS: 0.6 10*3/uL (ref 0.1–1.0)
Monocytes Relative: 23 %
NEUTROS ABS: 1.1 10*3/uL — AB (ref 1.7–7.7)
Neutrophils Relative %: 43 %
PLATELETS: 41 10*3/uL — AB (ref 150–400)
RBC: 3.83 MIL/uL — ABNORMAL LOW (ref 4.22–5.81)
RDW: 15.2 % (ref 11.5–15.5)
WBC: 2.6 10*3/uL — ABNORMAL LOW (ref 4.0–10.5)

## 2017-01-07 ENCOUNTER — Telehealth: Payer: Self-pay | Admitting: *Deleted

## 2017-01-07 NOTE — Telephone Encounter (Signed)
-----   Message from Annia Belt, MD sent at 01/04/2017  6:21 PM EST ----- Call pt: platelets down slightly at 41,000 but still in acceptable range. I heard back from 2 international experts. They agree with holding the Promacta and observing unless things change.

## 2017-01-07 NOTE — Telephone Encounter (Signed)
Talked to Dr Teryl Lucy - informed "Clinda would not cover bronchitis - only the dental infection. Primary care may want to get a CXR - if he doesn't - I will. " per Dr Beryle Beams. Stated he feels a little better today; but if cough continues, he will call his PCP.

## 2017-01-07 NOTE — Telephone Encounter (Signed)
Noted. Let pt know: Clinda would not cover bronchitis - only the dental infection. Primary care may want to get a CXR - if he doesn't - I will.

## 2017-01-07 NOTE — Telephone Encounter (Addendum)
called pt- he could not the call, stated his wife could. Wife informed "platelets down slightly at 41,000 but still in acceptable range. I heard back from 2 international experts. They agree with holding the Promacta and observing unless things change" per Dr Beryle Beams. Wife stated concern pt his a cough x 6 weeks- went to PCP, given rx for cough med with codeine, which he has taken but cough continues. She asked about white count which is 2.6. She also mentioned he has a dental infection and was started on Clindamycin on Monday. Told her to call his PCP back about his cough; also I will let inform Dr Beryle Beams

## 2017-01-10 DIAGNOSIS — R05 Cough: Secondary | ICD-10-CM | POA: Diagnosis not present

## 2017-01-18 ENCOUNTER — Other Ambulatory Visit (INDEPENDENT_AMBULATORY_CARE_PROVIDER_SITE_OTHER): Payer: Medicare HMO

## 2017-01-18 DIAGNOSIS — D693 Immune thrombocytopenic purpura: Secondary | ICD-10-CM | POA: Diagnosis not present

## 2017-01-18 DIAGNOSIS — D709 Neutropenia, unspecified: Secondary | ICD-10-CM | POA: Diagnosis not present

## 2017-01-18 DIAGNOSIS — D72821 Monocytosis (symptomatic): Secondary | ICD-10-CM

## 2017-01-18 DIAGNOSIS — D72819 Decreased white blood cell count, unspecified: Secondary | ICD-10-CM

## 2017-01-18 LAB — CBC WITH DIFFERENTIAL/PLATELET
Basophils Absolute: 0 10*3/uL (ref 0.0–0.1)
Basophils Relative: 0 %
EOS PCT: 1 %
Eosinophils Absolute: 0 10*3/uL (ref 0.0–0.7)
HEMATOCRIT: 36.1 % — AB (ref 39.0–52.0)
HEMOGLOBIN: 11.4 g/dL — AB (ref 13.0–17.0)
LYMPHS ABS: 1.1 10*3/uL (ref 0.7–4.0)
LYMPHS PCT: 39 %
MCH: 29.2 pg (ref 26.0–34.0)
MCHC: 31.6 g/dL (ref 30.0–36.0)
MCV: 92.3 fL (ref 78.0–100.0)
MONOS PCT: 24 %
Monocytes Absolute: 0.6 10*3/uL (ref 0.1–1.0)
NEUTROS ABS: 0.9 10*3/uL — AB (ref 1.7–7.7)
Neutrophils Relative %: 36 %
Platelets: 55 10*3/uL — ABNORMAL LOW (ref 150–400)
RBC: 3.91 MIL/uL — AB (ref 4.22–5.81)
RDW: 14.4 % (ref 11.5–15.5)
WBC: 2.6 10*3/uL — ABNORMAL LOW (ref 4.0–10.5)

## 2017-01-19 ENCOUNTER — Telehealth: Payer: Self-pay | Admitting: *Deleted

## 2017-01-19 LAB — PATHOLOGIST SMEAR REVIEW

## 2017-01-19 NOTE — Telephone Encounter (Signed)
-----   Message from Annia Belt, MD sent at 01/18/2017  6:58 PM EST ----- Call pt: platelets up to 55,000.

## 2017-01-19 NOTE — Telephone Encounter (Signed)
Pt called / informed "platelets up to 55,000" per Dr Beryle Beams.

## 2017-01-25 ENCOUNTER — Ambulatory Visit (INDEPENDENT_AMBULATORY_CARE_PROVIDER_SITE_OTHER): Payer: Medicare HMO | Admitting: Oncology

## 2017-01-25 ENCOUNTER — Other Ambulatory Visit: Payer: Commercial Managed Care - HMO

## 2017-01-25 ENCOUNTER — Encounter: Payer: Self-pay | Admitting: Oncology

## 2017-01-25 VITALS — BP 149/71 | HR 64 | Temp 97.7°F | Ht 71.0 in | Wt 198.5 lb

## 2017-01-25 DIAGNOSIS — D649 Anemia, unspecified: Secondary | ICD-10-CM

## 2017-01-25 DIAGNOSIS — Z880 Allergy status to penicillin: Secondary | ICD-10-CM

## 2017-01-25 DIAGNOSIS — D693 Immune thrombocytopenic purpura: Secondary | ICD-10-CM | POA: Diagnosis not present

## 2017-01-25 DIAGNOSIS — D72821 Monocytosis (symptomatic): Secondary | ICD-10-CM

## 2017-01-25 DIAGNOSIS — D72818 Other decreased white blood cell count: Secondary | ICD-10-CM | POA: Diagnosis not present

## 2017-01-25 DIAGNOSIS — D7581 Myelofibrosis: Secondary | ICD-10-CM

## 2017-01-25 NOTE — Patient Instructions (Signed)
Continue every 2 week CBC,diff standing order MD visit 8-10 weeks

## 2017-01-25 NOTE — Progress Notes (Signed)
Hematology and Oncology Follow Up Visit  Billy [name] [id number] 03/15/1941 76 y.o. 01/25/2017 1:01 PM   Principle Diagnosis: Encounter Diagnoses  Name Primary?  . Chronic idiopathic monocytosis Yes  . Secondary myelofibrosis ([place])   . 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 See prior notes for details. Over the last year, hemoglobin has drifted down. I was concerned with possibility that the meds were causing marrow fibrosis. Bone marrow aspiration by myself & Radiology yielded a "dry Tap". Biopsy sections from 11/04/16 marrow showed a hypercellular marrow with abnormal megakaryocytes and presence of fibrosis. Cytogenetic studies  showed normal chromosomes. Unfortunately, sample not processed for FISH. 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 This is confusing to me. This is the gene that programs the thrombopoietin receptor. He has been on TPA agonist therapy with a good repsonse.  Now off Promacta for 3 weeks. Stopped 11/16/16. Platelet count slightly less then when on drug at 45,000 today. Recent update on patients on long term Rx with TPA agonists shows about half the patients continue to maintain a response when drug stopped. Although this might reflect the natural history of ITP with occurrence of spontaneous remissions over time, it is encouraging that some patients can stop.  Interim History:   His platelet count has been stable off the Promacta, in fact, not much lower than when he was on the drug. I have been following counts weekly. Platelets in the range of 41-59,000 with most recent count on 01/18/2017 55,000. Only symptom at this time is fatigue. His hemoglobin has stabilized at 11 g with no further decline. He is getting over the flu which has been hanging in  there for about 3 weeks. He got a course of antibiotics due to a concomitant sinusitis but did not get any Tamiflu.  I did have the chance to consult international experts in the field Dr. Belenda Cruise who in turn reviewed the case with another expert Dr.Cuter. They were intrigued as well and did not have a good explanation for why somebody who was responding to thrombopoietin receptor agonists could also have a mutation in the receptor for thrombopoietin. They agreed with discontinuation and close monitoring. They suggested that if he had to go back on a TPO agonist, I might try the Romiplostim again. They also reinforced the fact that if the myelofibrosis is due to the drug, it is reversible when the drug is stopped.  Medications: reviewed  Allergies:  Allergies  Allergen Reactions  . Penicillins Other (See Comments)    Doesn't remember was infant    Review of Systems: See interim history  Remaining ROS negative:   Physical Exam: Blood pressure (!) 149/71, pulse 64, temperature 97.7 F (36.5 C), temperature source Oral, height 5\' 11"  (1.803 m), weight 198 lb 8 oz (90 kg), SpO2 97 %. Wt Readings from Last 3 Encounters:  01/25/17 198 lb 8 oz (90 kg)  12/06/16 200 lb 14.4 oz (91.1 kg)  10/18/16 199 lb 3.2 oz (90.4 kg)     General appearance: Well nourished Caucasian man [name]: Pharynx no erythema, exudate, mass, or ulcer. No thyromegaly or thyroid nodules Lymph nodes: No cervical, supraclavicular, or axillary lymphadenopathy Breasts: Lungs: Clear to auscultation, resonant to percussion throughout Heart: Regular rhythm, no murmur, no gallop, no rub, no click, no edema Abdomen: Soft, nontender, normal  bowel sounds, no mass, no organomegaly Extremities: No edema, no calf tenderness Musculoskeletal: no joint deformities GU:  Vascular: Carotid pulses 2+, no bruits, Neurologic: Alert, oriented, PERRLA,  cranial nerves grossly normal, motor strength 5 over 5, reflexes 1+ symmetric,  upper body coordination normal, gait normal, Skin: No rash or ecchymosis  Lab Results: CBC W/Diff    Component Value Date/Time   WBC 2.6 (L) 01/18/2017 1602   RBC 3.91 (L) 01/18/2017 1602   HGB 11.4 (L) 01/18/2017 1602   HGB 10.9 (L) 11/30/2016 1520   HCT 36.1 (L) 01/18/2017 1602   HCT 33.9 (L) 11/30/2016 1520   PLT 55 (L) 01/18/2017 1602   PLT 47 (L) 11/30/2016 1520   PLT 55 (LL) 11/09/2016 1202   MCV 92.3 01/18/2017 1602   MCV 92.1 11/30/2016 1520   MCH 29.2 01/18/2017 1602   MCHC 31.6 01/18/2017 1602   RDW 14.4 01/18/2017 1602   RDW 16.2 (H) 11/30/2016 1520   LYMPHSABS 1.1 01/18/2017 1602   LYMPHSABS 0.7 (L) 11/30/2016 1520   MONOABS 0.6 01/18/2017 1602   MONOABS 0.8 11/30/2016 1520   EOSABS 0.0 01/18/2017 1602   EOSABS 0.0 11/30/2016 1520   EOSABS 0.0 11/09/2016 1202   BASOSABS 0.0 01/18/2017 1602   BASOSABS 0.0 11/30/2016 1520     Chemistry      Component Value Date/Time   NA 144 11/09/2016 1202   NA 142 06/15/2016 1524   K 4.3 11/09/2016 1202   K 4.2 06/15/2016 1524   CL 104 11/09/2016 1202   CL 106 05/31/2013 0920   CO2 26 11/09/2016 1202   CO2 27 06/15/2016 1524   BUN 12 11/09/2016 1202   BUN 14.8 06/15/2016 1524   CREATININE 0.85 11/09/2016 1202   CREATININE 1.0 06/15/2016 1524      Component Value Date/Time   CALCIUM 9.0 11/09/2016 1202   CALCIUM 9.2 06/15/2016 1524   ALKPHOS 49 11/09/2016 1202   ALKPHOS 48 06/15/2016 1524   AST 19 11/09/2016 1202   AST 23 06/15/2016 1524   ALT 15 11/09/2016 1202   ALT 21 06/15/2016 1524   BILITOT 0.3 11/09/2016 1202   BILITOT 0.50 06/15/2016 1524       Radiological Studies: No results found.  Impression:  #1. Chronic immune thrombocytopenia. Holding thrombopoietin agonist drugs in view of early marrow fibrosis and fall in hemoglobin. Platelet count is holding in a safe range now off drug for 2 months. I will continue to monitor counts closely.  #2. Chronic leukopenia with idiopathic monocytosis. No  dysplastic changes in recent bone marrow. No excess blasts.  CC: Patient Care Team: Antony Contras, MD as PCP - General (Family Medicine) Annia Belt, MD as Consulting Physician (Hematology and Oncology)   Annia Belt, MD 2/6/20181:01 PM

## 2017-01-26 DIAGNOSIS — Z1211 Encounter for screening for malignant neoplasm of colon: Secondary | ICD-10-CM | POA: Diagnosis not present

## 2017-01-26 DIAGNOSIS — I10 Essential (primary) hypertension: Secondary | ICD-10-CM | POA: Diagnosis not present

## 2017-01-26 DIAGNOSIS — D693 Immune thrombocytopenic purpura: Secondary | ICD-10-CM | POA: Diagnosis not present

## 2017-01-26 DIAGNOSIS — Z1389 Encounter for screening for other disorder: Secondary | ICD-10-CM | POA: Diagnosis not present

## 2017-01-26 DIAGNOSIS — Z0000 Encounter for general adult medical examination without abnormal findings: Secondary | ICD-10-CM | POA: Diagnosis not present

## 2017-01-26 DIAGNOSIS — Z125 Encounter for screening for malignant neoplasm of prostate: Secondary | ICD-10-CM | POA: Diagnosis not present

## 2017-01-26 DIAGNOSIS — E782 Mixed hyperlipidemia: Secondary | ICD-10-CM | POA: Diagnosis not present

## 2017-02-01 ENCOUNTER — Other Ambulatory Visit (INDEPENDENT_AMBULATORY_CARE_PROVIDER_SITE_OTHER): Payer: Medicare HMO

## 2017-02-01 DIAGNOSIS — D693 Immune thrombocytopenic purpura: Secondary | ICD-10-CM | POA: Diagnosis not present

## 2017-02-01 DIAGNOSIS — D72821 Monocytosis (symptomatic): Secondary | ICD-10-CM

## 2017-02-01 DIAGNOSIS — D72819 Decreased white blood cell count, unspecified: Secondary | ICD-10-CM

## 2017-02-01 LAB — CBC WITH DIFFERENTIAL/PLATELET
BASOS PCT: 1 %
Basophils Absolute: 0 10*3/uL (ref 0.0–0.1)
Eosinophils Absolute: 0 10*3/uL (ref 0.0–0.7)
Eosinophils Relative: 1 %
HCT: 39.1 % (ref 39.0–52.0)
Hemoglobin: 12.6 g/dL — ABNORMAL LOW (ref 13.0–17.0)
Lymphocytes Relative: 43 %
Lymphs Abs: 1.2 10*3/uL (ref 0.7–4.0)
MCH: 30 pg (ref 26.0–34.0)
MCHC: 32.2 g/dL (ref 30.0–36.0)
MCV: 93.1 fL (ref 78.0–100.0)
MONOS PCT: 24 %
Monocytes Absolute: 0.7 10*3/uL (ref 0.1–1.0)
NEUTROS PCT: 31 %
Neutro Abs: 0.9 10*3/uL — ABNORMAL LOW (ref 1.7–7.7)
PLATELETS: 37 10*3/uL — AB (ref 150–400)
RBC: 4.2 MIL/uL — AB (ref 4.22–5.81)
RDW: 14.4 % (ref 11.5–15.5)
WBC: 2.8 10*3/uL — ABNORMAL LOW (ref 4.0–10.5)

## 2017-02-02 ENCOUNTER — Telehealth: Payer: Self-pay | Admitting: *Deleted

## 2017-02-02 NOTE — Telephone Encounter (Signed)
-----   Message from Annia Belt, MD sent at 02/01/2017  5:07 PM EST ----- Call pt: platelets dropped to 37,000. We need to repeat in 1 week

## 2017-02-02 NOTE — Telephone Encounter (Signed)
Pt called / informed "platelets dropped to 37,000. We need to repeat in 1 week" per Dr Beryle Beams. Lab appt scheduled next Tuesday the 20th @ 1000AM.

## 2017-02-08 ENCOUNTER — Other Ambulatory Visit (INDEPENDENT_AMBULATORY_CARE_PROVIDER_SITE_OTHER): Payer: Medicare HMO

## 2017-02-08 DIAGNOSIS — D72821 Monocytosis (symptomatic): Secondary | ICD-10-CM

## 2017-02-08 DIAGNOSIS — D693 Immune thrombocytopenic purpura: Secondary | ICD-10-CM

## 2017-02-08 DIAGNOSIS — D72819 Decreased white blood cell count, unspecified: Secondary | ICD-10-CM

## 2017-02-08 LAB — CBC WITH DIFFERENTIAL/PLATELET
BASOS ABS: 0 10*3/uL (ref 0.0–0.1)
Basophils Relative: 1 %
EOS PCT: 1 %
Eosinophils Absolute: 0 10*3/uL (ref 0.0–0.7)
HEMATOCRIT: 40.3 % (ref 39.0–52.0)
Hemoglobin: 12.9 g/dL — ABNORMAL LOW (ref 13.0–17.0)
LYMPHS ABS: 0.8 10*3/uL (ref 0.7–4.0)
Lymphocytes Relative: 45 %
MCH: 29.3 pg (ref 26.0–34.0)
MCHC: 32 g/dL (ref 30.0–36.0)
MCV: 91.6 fL (ref 78.0–100.0)
MONO ABS: 0.4 10*3/uL (ref 0.1–1.0)
Monocytes Relative: 23 %
NEUTROS ABS: 0.5 10*3/uL — AB (ref 1.7–7.7)
Neutrophils Relative %: 30 %
PLATELETS: 44 10*3/uL — AB (ref 150–400)
RBC: 4.4 MIL/uL (ref 4.22–5.81)
RDW: 13.9 % (ref 11.5–15.5)
WBC: 1.7 10*3/uL — AB (ref 4.0–10.5)

## 2017-02-10 ENCOUNTER — Telehealth: Payer: Self-pay | Admitting: *Deleted

## 2017-02-10 NOTE — Telephone Encounter (Signed)
-----   Message from Annia Belt, MD sent at 02/08/2017  3:44 PM EST ----- Call pt: platelets up a little to 44,000. We are OK to keep watching

## 2017-02-10 NOTE — Telephone Encounter (Signed)
Called pt - no answer; left message "platelets up a little to 44,000. We are OK to keep watching" per Dr Beryle Beams. And call for any questions.

## 2017-02-14 ENCOUNTER — Other Ambulatory Visit: Payer: Self-pay | Admitting: Oncology

## 2017-02-14 ENCOUNTER — Telehealth: Payer: Self-pay | Admitting: Family Medicine

## 2017-02-14 DIAGNOSIS — D693 Immune thrombocytopenic purpura: Secondary | ICD-10-CM

## 2017-02-14 DIAGNOSIS — D7581 Myelofibrosis: Secondary | ICD-10-CM

## 2017-02-14 DIAGNOSIS — D72821 Monocytosis (symptomatic): Secondary | ICD-10-CM

## 2017-02-14 DIAGNOSIS — D649 Anemia, unspecified: Secondary | ICD-10-CM

## 2017-02-14 DIAGNOSIS — D72819 Decreased white blood cell count, unspecified: Secondary | ICD-10-CM

## 2017-02-14 NOTE — Telephone Encounter (Signed)
APT. REMINDER CALL, NO ANSWER, NO VOICEMAIL °

## 2017-02-15 ENCOUNTER — Other Ambulatory Visit (INDEPENDENT_AMBULATORY_CARE_PROVIDER_SITE_OTHER): Payer: Medicare HMO

## 2017-02-15 DIAGNOSIS — D693 Immune thrombocytopenic purpura: Secondary | ICD-10-CM | POA: Diagnosis not present

## 2017-02-15 DIAGNOSIS — D709 Neutropenia, unspecified: Secondary | ICD-10-CM | POA: Diagnosis not present

## 2017-02-15 DIAGNOSIS — D696 Thrombocytopenia, unspecified: Secondary | ICD-10-CM | POA: Diagnosis not present

## 2017-02-15 DIAGNOSIS — D7581 Myelofibrosis: Secondary | ICD-10-CM

## 2017-02-15 DIAGNOSIS — D72821 Monocytosis (symptomatic): Secondary | ICD-10-CM

## 2017-02-15 DIAGNOSIS — D72819 Decreased white blood cell count, unspecified: Secondary | ICD-10-CM

## 2017-02-15 DIAGNOSIS — D649 Anemia, unspecified: Secondary | ICD-10-CM

## 2017-02-15 LAB — CBC WITH DIFFERENTIAL/PLATELET
BASOS PCT: 1 %
Basophils Absolute: 0 10*3/uL (ref 0.0–0.1)
EOS PCT: 1 %
Eosinophils Absolute: 0 10*3/uL (ref 0.0–0.7)
HEMATOCRIT: 39.2 % (ref 39.0–52.0)
HEMOGLOBIN: 12.6 g/dL — AB (ref 13.0–17.0)
Lymphocytes Relative: 38 %
Lymphs Abs: 1.1 10*3/uL (ref 0.7–4.0)
MCH: 29.4 pg (ref 26.0–34.0)
MCHC: 32.1 g/dL (ref 30.0–36.0)
MCV: 91.4 fL (ref 78.0–100.0)
MONO ABS: 0.7 10*3/uL (ref 0.1–1.0)
MONOS PCT: 28 %
NEUTROS PCT: 32 %
Neutro Abs: 0.8 10*3/uL — ABNORMAL LOW (ref 1.7–7.7)
PLATELETS: 52 10*3/uL — AB (ref 150–400)
RBC: 4.29 MIL/uL (ref 4.22–5.81)
RDW: 13.4 % (ref 11.5–15.5)
WBC: 2.6 10*3/uL — ABNORMAL LOW (ref 4.0–10.5)

## 2017-02-17 ENCOUNTER — Telehealth: Payer: Self-pay | Admitting: *Deleted

## 2017-02-17 LAB — PATHOLOGIST SMEAR REVIEW

## 2017-02-17 NOTE — Telephone Encounter (Signed)
-----   Message from Annia Belt, MD sent at 02/15/2017  4:27 PM EST ----- Call pt: platelet count holding at 52,000. Can check in 2 weeks

## 2017-02-17 NOTE — Telephone Encounter (Signed)
Pt called / informed "platelet count holding at 52,000. Can check in 2 weeks" per Dr Beryle Beams. Appt already scheduled for 3/13@ 1430 PM - pt made awared.

## 2017-03-01 ENCOUNTER — Other Ambulatory Visit (INDEPENDENT_AMBULATORY_CARE_PROVIDER_SITE_OTHER): Payer: Medicare HMO

## 2017-03-01 DIAGNOSIS — D693 Immune thrombocytopenic purpura: Secondary | ICD-10-CM

## 2017-03-01 DIAGNOSIS — D649 Anemia, unspecified: Secondary | ICD-10-CM

## 2017-03-01 DIAGNOSIS — D7581 Myelofibrosis: Secondary | ICD-10-CM

## 2017-03-01 DIAGNOSIS — D72819 Decreased white blood cell count, unspecified: Secondary | ICD-10-CM

## 2017-03-01 DIAGNOSIS — D72821 Monocytosis (symptomatic): Secondary | ICD-10-CM

## 2017-03-01 LAB — CBC WITH DIFFERENTIAL/PLATELET
BASOS PCT: 1 %
Basophils Absolute: 0 10*3/uL (ref 0.0–0.1)
Eosinophils Absolute: 0 10*3/uL (ref 0.0–0.7)
Eosinophils Relative: 1 %
HEMATOCRIT: 41.1 % (ref 39.0–52.0)
HEMOGLOBIN: 13.3 g/dL (ref 13.0–17.0)
LYMPHS PCT: 27 %
Lymphs Abs: 1 10*3/uL (ref 0.7–4.0)
MCH: 29.6 pg (ref 26.0–34.0)
MCHC: 32.4 g/dL (ref 30.0–36.0)
MCV: 91.5 fL (ref 78.0–100.0)
MONO ABS: 1 10*3/uL (ref 0.1–1.0)
MONOS PCT: 27 %
NEUTROS ABS: 1.6 10*3/uL — AB (ref 1.7–7.7)
NEUTROS PCT: 45 %
Platelets: 50 10*3/uL — ABNORMAL LOW (ref 150–400)
RBC: 4.49 MIL/uL (ref 4.22–5.81)
RDW: 13 % (ref 11.5–15.5)
WBC: 3.6 10*3/uL — ABNORMAL LOW (ref 4.0–10.5)

## 2017-03-04 ENCOUNTER — Telehealth: Payer: Self-pay | Admitting: *Deleted

## 2017-03-04 NOTE — Telephone Encounter (Signed)
Called pt - no answer; left message "platelet count stable @ 50,000" per Dr Beryle Beams. ANd call for any questions.

## 2017-03-04 NOTE — Telephone Encounter (Signed)
-----   Message from Annia Belt, MD sent at 03/02/2017  5:34 PM EDT ----- Call pt: platelet count stable @ 50,000

## 2017-03-15 ENCOUNTER — Other Ambulatory Visit (INDEPENDENT_AMBULATORY_CARE_PROVIDER_SITE_OTHER): Payer: Medicare HMO

## 2017-03-15 DIAGNOSIS — D7581 Myelofibrosis: Secondary | ICD-10-CM

## 2017-03-15 DIAGNOSIS — D72819 Decreased white blood cell count, unspecified: Secondary | ICD-10-CM

## 2017-03-15 DIAGNOSIS — D649 Anemia, unspecified: Secondary | ICD-10-CM

## 2017-03-15 DIAGNOSIS — D72821 Monocytosis (symptomatic): Secondary | ICD-10-CM

## 2017-03-15 DIAGNOSIS — D693 Immune thrombocytopenic purpura: Secondary | ICD-10-CM | POA: Diagnosis not present

## 2017-03-15 LAB — CBC WITH DIFFERENTIAL/PLATELET
BASOS ABS: 0 10*3/uL (ref 0.0–0.1)
Basophils Relative: 0 %
EOS PCT: 0 %
Eosinophils Absolute: 0 10*3/uL (ref 0.0–0.7)
HCT: 41.5 % (ref 39.0–52.0)
Hemoglobin: 13.5 g/dL (ref 13.0–17.0)
LYMPHS PCT: 33 %
Lymphs Abs: 1 10*3/uL (ref 0.7–4.0)
MCH: 29.3 pg (ref 26.0–34.0)
MCHC: 32.5 g/dL (ref 30.0–36.0)
MCV: 90.2 fL (ref 78.0–100.0)
MONO ABS: 0.9 10*3/uL (ref 0.1–1.0)
Monocytes Relative: 32 %
Neutro Abs: 1 10*3/uL — ABNORMAL LOW (ref 1.7–7.7)
Neutrophils Relative %: 34 %
PLATELETS: DECREASED 10*3/uL (ref 150–400)
RBC: 4.6 MIL/uL (ref 4.22–5.81)
RDW: 12.8 % (ref 11.5–15.5)
WBC: 2.9 10*3/uL — ABNORMAL LOW (ref 4.0–10.5)

## 2017-03-17 ENCOUNTER — Telehealth: Payer: Self-pay | Admitting: *Deleted

## 2017-03-17 DIAGNOSIS — I10 Essential (primary) hypertension: Secondary | ICD-10-CM | POA: Diagnosis not present

## 2017-03-17 NOTE — Telephone Encounter (Signed)
-----   Message from Billy Belt, MD sent at 03/15/2017  3:36 PM EDT ----- Call pt: platelets 34,000. Repeat in 2 weeks

## 2017-03-17 NOTE — Telephone Encounter (Signed)
Pt called / informed "platelets 34,000. Repeat in 2 weeks" per Dr Beryle Beams. Lab appt already scheduled for 4/10 - pt informed.

## 2017-03-22 ENCOUNTER — Other Ambulatory Visit: Payer: Commercial Managed Care - HMO

## 2017-03-29 ENCOUNTER — Other Ambulatory Visit (INDEPENDENT_AMBULATORY_CARE_PROVIDER_SITE_OTHER): Payer: Medicare HMO

## 2017-03-29 DIAGNOSIS — D693 Immune thrombocytopenic purpura: Secondary | ICD-10-CM | POA: Diagnosis not present

## 2017-03-29 DIAGNOSIS — D72821 Monocytosis (symptomatic): Secondary | ICD-10-CM

## 2017-03-29 DIAGNOSIS — D72819 Decreased white blood cell count, unspecified: Secondary | ICD-10-CM

## 2017-03-29 DIAGNOSIS — D649 Anemia, unspecified: Secondary | ICD-10-CM

## 2017-03-29 DIAGNOSIS — D7581 Myelofibrosis: Secondary | ICD-10-CM

## 2017-03-29 LAB — CBC WITH DIFFERENTIAL/PLATELET
BASOS PCT: 1 %
Basophils Absolute: 0 10*3/uL (ref 0.0–0.1)
EOS ABS: 0 10*3/uL (ref 0.0–0.7)
Eosinophils Relative: 1 %
HCT: 41.6 % (ref 39.0–52.0)
HEMOGLOBIN: 13.5 g/dL (ref 13.0–17.0)
Lymphocytes Relative: 27 %
Lymphs Abs: 1 10*3/uL (ref 0.7–4.0)
MCH: 29.3 pg (ref 26.0–34.0)
MCHC: 32.5 g/dL (ref 30.0–36.0)
MCV: 90.4 fL (ref 78.0–100.0)
MONOS PCT: 28 %
Monocytes Absolute: 1.1 10*3/uL — ABNORMAL HIGH (ref 0.1–1.0)
NEUTROS PCT: 44 %
Neutro Abs: 1.6 10*3/uL — ABNORMAL LOW (ref 1.7–7.7)
Platelets: 42 10*3/uL — ABNORMAL LOW (ref 150–400)
RBC: 4.6 MIL/uL (ref 4.22–5.81)
RDW: 12.6 % (ref 11.5–15.5)
WBC: 3.7 10*3/uL — AB (ref 4.0–10.5)

## 2017-03-30 ENCOUNTER — Telehealth: Payer: Self-pay | Admitting: *Deleted

## 2017-03-30 NOTE — Telephone Encounter (Signed)
-----   Message from Annia Belt, MD sent at 03/29/2017  4:42 PM EDT ----- Call pt: platelets OK at 42,000; we can repeat again in one month

## 2017-03-30 NOTE — Telephone Encounter (Signed)
Pt called / informed "platelets OK at 42,000; we can repeat again in one month " per Dr Beryle Beams. Pt already has lab appt 5/3 - informed to keep this appt.

## 2017-04-12 ENCOUNTER — Other Ambulatory Visit: Payer: Commercial Managed Care - HMO

## 2017-04-26 ENCOUNTER — Other Ambulatory Visit (INDEPENDENT_AMBULATORY_CARE_PROVIDER_SITE_OTHER): Payer: Medicare HMO

## 2017-04-26 DIAGNOSIS — D72819 Decreased white blood cell count, unspecified: Secondary | ICD-10-CM

## 2017-04-26 DIAGNOSIS — D7581 Myelofibrosis: Secondary | ICD-10-CM

## 2017-04-26 DIAGNOSIS — D649 Anemia, unspecified: Secondary | ICD-10-CM

## 2017-04-26 DIAGNOSIS — D72821 Monocytosis (symptomatic): Secondary | ICD-10-CM

## 2017-04-26 DIAGNOSIS — D693 Immune thrombocytopenic purpura: Secondary | ICD-10-CM | POA: Diagnosis not present

## 2017-04-26 LAB — CBC WITH DIFFERENTIAL/PLATELET
BAND NEUTROPHILS: 0 %
BASOS ABS: 0 10*3/uL (ref 0.0–0.1)
BLASTS: 0 %
Basophils Relative: 0 %
Eosinophils Absolute: 0 10*3/uL (ref 0.0–0.7)
Eosinophils Relative: 1 %
HCT: 40.4 % (ref 39.0–52.0)
HEMOGLOBIN: 13.1 g/dL (ref 13.0–17.0)
LYMPHS ABS: 1.4 10*3/uL (ref 0.7–4.0)
Lymphocytes Relative: 43 %
MCH: 29.4 pg (ref 26.0–34.0)
MCHC: 32.4 g/dL (ref 30.0–36.0)
MCV: 90.8 fL (ref 78.0–100.0)
METAMYELOCYTES PCT: 0 %
MONOS PCT: 18 %
Monocytes Absolute: 0.6 10*3/uL (ref 0.1–1.0)
Myelocytes: 0 %
NEUTROS ABS: 1.2 10*3/uL — AB (ref 1.7–7.7)
Neutrophils Relative %: 38 %
Other: 0 %
PLATELETS: 48 10*3/uL — AB (ref 150–400)
Promyelocytes Absolute: 0 %
RBC: 4.45 MIL/uL (ref 4.22–5.81)
RDW: 12.7 % (ref 11.5–15.5)
WBC: 3.2 10*3/uL — ABNORMAL LOW (ref 4.0–10.5)
nRBC: 0 /100 WBC

## 2017-05-03 ENCOUNTER — Telehealth: Payer: Self-pay | Admitting: *Deleted

## 2017-05-03 NOTE — Telephone Encounter (Signed)
-----   Message from Annia Belt, MD sent at 05/03/2017  9:16 AM EDT ----- Not sure if he was called last week - platelet count holding at 48,000 - we can continue to check monthly

## 2017-05-03 NOTE — Telephone Encounter (Signed)
Pt called / informed "platelet count holding at 48,000 - we can continue to check monthly" per Dr Beryle Beams. Lab appt scheduled June 5.

## 2017-05-10 ENCOUNTER — Other Ambulatory Visit: Payer: Commercial Managed Care - HMO

## 2017-05-17 ENCOUNTER — Other Ambulatory Visit: Payer: Commercial Managed Care - HMO

## 2017-05-24 ENCOUNTER — Other Ambulatory Visit (INDEPENDENT_AMBULATORY_CARE_PROVIDER_SITE_OTHER): Payer: Medicare HMO

## 2017-05-24 DIAGNOSIS — D7581 Myelofibrosis: Secondary | ICD-10-CM

## 2017-05-24 DIAGNOSIS — D72819 Decreased white blood cell count, unspecified: Secondary | ICD-10-CM

## 2017-05-24 DIAGNOSIS — D693 Immune thrombocytopenic purpura: Secondary | ICD-10-CM

## 2017-05-24 DIAGNOSIS — D72821 Monocytosis (symptomatic): Secondary | ICD-10-CM

## 2017-05-24 DIAGNOSIS — D649 Anemia, unspecified: Secondary | ICD-10-CM

## 2017-05-24 LAB — CBC WITH DIFFERENTIAL/PLATELET
Basophils Absolute: 0 10*3/uL (ref 0.0–0.1)
Basophils Relative: 1 %
EOS PCT: 1 %
Eosinophils Absolute: 0 10*3/uL (ref 0.0–0.7)
HEMATOCRIT: 43.4 % (ref 39.0–52.0)
Hemoglobin: 14.2 g/dL (ref 13.0–17.0)
LYMPHS ABS: 1.3 10*3/uL (ref 0.7–4.0)
LYMPHS PCT: 34 %
MCH: 30.1 pg (ref 26.0–34.0)
MCHC: 32.7 g/dL (ref 30.0–36.0)
MCV: 91.9 fL (ref 78.0–100.0)
MONO ABS: 0.9 10*3/uL (ref 0.1–1.0)
MONOS PCT: 22 %
NEUTROS ABS: 1.7 10*3/uL (ref 1.7–7.7)
Neutrophils Relative %: 42 %
Platelets: 53 10*3/uL — ABNORMAL LOW (ref 150–400)
RBC: 4.72 MIL/uL (ref 4.22–5.81)
RDW: 12.4 % (ref 11.5–15.5)
WBC: 3.9 10*3/uL — ABNORMAL LOW (ref 4.0–10.5)

## 2017-05-30 ENCOUNTER — Telehealth: Payer: Self-pay | Admitting: *Deleted

## 2017-05-30 NOTE — Telephone Encounter (Signed)
-----   Message from Annia Belt, MD sent at 05/26/2017  2:32 PM EDT ----- Call pt: platelets stable at 53,000

## 2017-05-30 NOTE — Telephone Encounter (Signed)
Pt called / informed "platelets stable at 53,000" per Dr Beryle Beams.

## 2017-08-31 DIAGNOSIS — Z23 Encounter for immunization: Secondary | ICD-10-CM | POA: Diagnosis not present

## 2017-09-07 ENCOUNTER — Other Ambulatory Visit: Payer: Self-pay | Admitting: Oncology

## 2017-09-07 DIAGNOSIS — D72819 Decreased white blood cell count, unspecified: Secondary | ICD-10-CM

## 2017-09-07 DIAGNOSIS — D72821 Monocytosis (symptomatic): Secondary | ICD-10-CM

## 2017-09-07 DIAGNOSIS — D693 Immune thrombocytopenic purpura: Secondary | ICD-10-CM

## 2017-09-20 ENCOUNTER — Other Ambulatory Visit: Payer: Self-pay | Admitting: Oncology

## 2017-09-20 DIAGNOSIS — D693 Immune thrombocytopenic purpura: Secondary | ICD-10-CM

## 2017-10-11 ENCOUNTER — Other Ambulatory Visit: Payer: Medicare HMO

## 2017-10-14 ENCOUNTER — Other Ambulatory Visit (INDEPENDENT_AMBULATORY_CARE_PROVIDER_SITE_OTHER): Payer: Medicare HMO

## 2017-10-14 DIAGNOSIS — D72819 Decreased white blood cell count, unspecified: Secondary | ICD-10-CM | POA: Diagnosis not present

## 2017-10-14 DIAGNOSIS — D72821 Monocytosis (symptomatic): Secondary | ICD-10-CM

## 2017-10-14 DIAGNOSIS — D693 Immune thrombocytopenic purpura: Secondary | ICD-10-CM

## 2017-10-15 LAB — CBC WITH DIFFERENTIAL/PLATELET
Basophils Absolute: 0 10*3/uL (ref 0.0–0.2)
Basos: 1 %
EOS (ABSOLUTE): 0 10*3/uL (ref 0.0–0.4)
EOS: 0 %
HEMOGLOBIN: 14.2 g/dL (ref 13.0–17.7)
Hematocrit: 43 % (ref 37.5–51.0)
Immature Grans (Abs): 0 10*3/uL (ref 0.0–0.1)
Immature Granulocytes: 0 %
LYMPHS ABS: 1 10*3/uL (ref 0.7–3.1)
LYMPHS: 33 %
MCH: 30.4 pg (ref 26.6–33.0)
MCHC: 33 g/dL (ref 31.5–35.7)
MCV: 92 fL (ref 79–97)
MONOCYTES: 39 %
Monocytes Absolute: 1.1 10*3/uL — ABNORMAL HIGH (ref 0.1–0.9)
NEUTROS ABS: 0.8 10*3/uL — AB (ref 1.4–7.0)
Neutrophils: 27 %
PLATELETS: 63 10*3/uL — AB (ref 150–379)
RBC: 4.67 x10E6/uL (ref 4.14–5.80)
RDW: 12.8 % (ref 12.3–15.4)
WBC: 3 10*3/uL — AB (ref 3.4–10.8)

## 2017-10-15 LAB — COMPREHENSIVE METABOLIC PANEL
ALBUMIN: 4.1 g/dL (ref 3.5–4.8)
ALT: 29 IU/L (ref 0–44)
AST: 22 IU/L (ref 0–40)
Albumin/Globulin Ratio: 1.6 (ref 1.2–2.2)
Alkaline Phosphatase: 47 IU/L (ref 39–117)
BUN / CREAT RATIO: 12 (ref 10–24)
BUN: 12 mg/dL (ref 8–27)
Bilirubin Total: 0.5 mg/dL (ref 0.0–1.2)
CO2: 28 mmol/L (ref 20–29)
CREATININE: 1 mg/dL (ref 0.76–1.27)
Calcium: 9.3 mg/dL (ref 8.6–10.2)
Chloride: 100 mmol/L (ref 96–106)
GFR calc non Af Amer: 74 mL/min/{1.73_m2} (ref >59)
GFR, EST AFRICAN AMERICAN: 85 mL/min/{1.73_m2} (ref >59)
GLUCOSE: 87 mg/dL (ref 65–99)
Globulin, Total: 2.5 g/dL (ref 1.5–4.5)
Potassium: 3.8 mmol/L (ref 3.5–5.2)
Sodium: 143 mmol/L (ref 134–144)
TOTAL PROTEIN: 6.6 g/dL (ref 6.0–8.5)

## 2017-10-31 ENCOUNTER — Ambulatory Visit: Payer: Medicare HMO | Admitting: Oncology

## 2017-10-31 ENCOUNTER — Encounter: Payer: Self-pay | Admitting: Oncology

## 2017-10-31 ENCOUNTER — Other Ambulatory Visit: Payer: Self-pay

## 2017-10-31 VITALS — BP 119/56 | HR 80 | Temp 97.9°F | Ht 71.0 in | Wt 204.2 lb

## 2017-10-31 DIAGNOSIS — L989 Disorder of the skin and subcutaneous tissue, unspecified: Secondary | ICD-10-CM

## 2017-10-31 DIAGNOSIS — D72821 Monocytosis (symptomatic): Secondary | ICD-10-CM | POA: Diagnosis not present

## 2017-10-31 DIAGNOSIS — D649 Anemia, unspecified: Secondary | ICD-10-CM | POA: Diagnosis not present

## 2017-10-31 DIAGNOSIS — D7581 Myelofibrosis: Secondary | ICD-10-CM

## 2017-10-31 DIAGNOSIS — D693 Immune thrombocytopenic purpura: Secondary | ICD-10-CM

## 2017-10-31 NOTE — Patient Instructions (Signed)
Lab 3 months Lab 6 months - MD visit 1-2 weeks after lab

## 2017-10-31 NOTE — Progress Notes (Signed)
Hematology and Oncology Follow Up Visit  Billy [name] [id number] 06-07-1941 76 y.o. 10/31/2017 7:28 PM   Principle Diagnosis: Encounter Diagnoses  Name Primary?  . Chronic idiopathic monocytosis   . Normochromic anemia   . Secondary myelofibrosis ([place])   . 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 See prior notes for details.  In June 2017, hemoglobin  began to drift down. I was concerned with possibility that the meds were causing marrow fibrosis. Bone marrow aspiration by myself & Radiology yielded a "dry Tap". Biopsy sections from 11/04/16 marrow showed a hypercellular marrow with abnormal megakaryocytes and presence of fibrosis. Cytogenetic studies  showed normal chromosomes. Unfortunately, sample not processed for FISH. 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 This was confusing to me. This is the gene that programs the thrombopoietin receptor. He had been on TPA agonist therapy with a good repsonse.  I consulted with an international expert, [place], in [place].  He was also puzzled by this.  We mutually decided to stop the thrombopoietin agonist.  Promacta  was stopped 11/16/16. Platelet count initially slightly less then when on drug.   Subsequently, his platelet count has been stable off the Promacta, and there has been a trend for slow rise in his count. Hemoglobin which fell as low as 11 g, stabilized then rose back up to normal-14 g! He has chronic leukopenia and monocytosis which has not changed.  There is been no evidence for leukemic transformation.  Interim History: Other than age-related fatigue and joint stiffness he has no complaints.  Platelet count done in anticipation of today's visit on  October 26 is up to 63,000.  Hemoglobin 14.2. He has had no bleeding issues.  No epistaxis, no hematuria, no hematochezia.  Medications: reviewed  Allergies:  Allergies  Allergen Reactions  . Penicillins Other (See Comments)    Doesn't remember was infant    Review of Systems: See interim history Remaining ROS negative:   Physical Exam: Blood pressure (!) 119/56, pulse 80, temperature 97.9 F (36.6 C), temperature source Oral, height '5\' 11"'$  (1.803 m), weight 204 lb 3.2 oz (92.6 kg), SpO2 98 %. Wt Readings from Last 3 Encounters:  10/31/17 204 lb 3.2 oz (92.6 kg)  01/25/17 198 lb 8 oz (90 kg)  12/06/16 200 lb 14.4 oz (91.1 kg)     General appearance: Well-nourished Caucasian man [name]: Pharynx no erythema, exudate, mass, or ulcer. No thyromegaly or thyroid nodules Lymph nodes: No cervical, supraclavicular, or axillary lymphadenopathy Breasts:  Lungs: Clear to auscultation, resonant to percussion throughout Heart: Regular rhythm, no murmur, no gallop, no rub, no click, no edema Abdomen: Soft, nontender, normal bowel sounds, no mass, no organomegaly Extremities: No edema, no calf tenderness Musculoskeletal: no joint deformities GU:  Vascular: Carotid pulses 2+, no bruits, distal pulses: Dorsalis pedis 1+ symmetric Neurologic: Alert, oriented, PERRLA, optic discs sharp and vessels normal, no hemorrhage or exudate, cranial nerves grossly normal, motor strength 5 over 5, reflexes 1+ symmetric, upper body coordination normal, gait normal, Skin: No rash or ecchymosis.  There is a hypopigmented lesion on his right cheek 4 x 4 mm.  I think this may just be a darkly pigmented seborrheic keratosis.  He says it has been there for a long time and has not changed.  I did  advise that he get an appointment with a dermatologist for evaluation.  Lab Results: CBC W/Diff    Component Value Date/Time   WBC 3.0 (L) 10/14/2017 1532   WBC 3.9 (L) 05/24/2017 1440   RBC 4.67 10/14/2017 1532   RBC  4.72 05/24/2017 1440   HGB 14.2 10/14/2017 1532   HGB 10.9 (L) 11/30/2016 1520   HCT 43.0 10/14/2017 1532   HCT 33.9 (L) 11/30/2016 1520   PLT 63 (LL) 10/14/2017 1532   MCV 92 10/14/2017 1532   MCV 92.1 11/30/2016 1520   MCH 30.4 10/14/2017 1532   MCH 30.1 05/24/2017 1440   MCHC 33.0 10/14/2017 1532   MCHC 32.7 05/24/2017 1440   RDW 12.8 10/14/2017 1532   RDW 16.2 (H) 11/30/2016 1520   LYMPHSABS 1.0 10/14/2017 1532   LYMPHSABS 0.7 (L) 11/30/2016 1520   MONOABS 0.9 05/24/2017 1440   MONOABS 0.8 11/30/2016 1520   EOSABS 0.0 10/14/2017 1532   BASOSABS 0.0 10/14/2017 1532   BASOSABS 0.0 11/30/2016 1520     Chemistry      Component Value Date/Time   NA 143 10/14/2017 1532   NA 142 06/15/2016 1524   K 3.8 10/14/2017 1532   K 4.2 06/15/2016 1524   CL 100 10/14/2017 1532   CL 106 05/31/2013 0920   CO2 28 10/14/2017 1532   CO2 27 06/15/2016 1524   BUN 12 10/14/2017 1532   BUN 14.8 06/15/2016 1524   CREATININE 1.00 10/14/2017 1532   CREATININE 1.0 06/15/2016 1524      Component Value Date/Time   CALCIUM 9.3 10/14/2017 1532   CALCIUM 9.2 06/15/2016 1524   ALKPHOS 47 10/14/2017 1532   ALKPHOS 48 06/15/2016 1524   AST 22 10/14/2017 1532   AST 23 06/15/2016 1524   ALT 29 10/14/2017 1532   ALT 21 06/15/2016 1524   BILITOT 0.5 10/14/2017 1532   BILITOT 0.50 06/15/2016 1524       Radiological Studies: No results found.  Impression:  1.  ITP Sustained partial response of all thrombopoietin agonists. Continue periodic monitoring of his blood counts.  I will decrease frequency to every 3 months.  2.  Early myelofibrosis felt secondary to chronic use of TPO agonists.  Complete normalization of hemoglobin of Promacta.  3.  Chronic idiopathic leukopenia and monocytosis stable with no evidence for leukemic transformation on bone marrow biopsy done November 2017  4.  Hyperpigmented lesion right cheek Dermatology referral  CC: Patient Care Team: Antony Contras, MD as PCP -  General (Family Medicine) Beryle Beams Alyson Locket, MD as Consulting Physician (Hematology and Oncology)   Murriel Hopper, MD, [facility]  Hematology-Oncology/Internal Medicine     11/12/20187:28 PM

## 2017-11-02 ENCOUNTER — Encounter: Payer: Self-pay | Admitting: Oncology

## 2017-11-14 DIAGNOSIS — L821 Other seborrheic keratosis: Secondary | ICD-10-CM | POA: Diagnosis not present

## 2017-11-14 DIAGNOSIS — B354 Tinea corporis: Secondary | ICD-10-CM | POA: Diagnosis not present

## 2018-02-01 ENCOUNTER — Other Ambulatory Visit: Payer: Self-pay | Admitting: Family Medicine

## 2018-02-01 ENCOUNTER — Ambulatory Visit
Admission: RE | Admit: 2018-02-01 | Discharge: 2018-02-01 | Disposition: A | Discharge status: Home / Self Care | Payer: Medicare HMO | Source: Ambulatory Visit | Attending: Family Medicine | Admitting: Family Medicine

## 2018-02-01 DIAGNOSIS — E782 Mixed hyperlipidemia: Secondary | ICD-10-CM | POA: Diagnosis not present

## 2018-02-01 DIAGNOSIS — I10 Essential (primary) hypertension: Secondary | ICD-10-CM | POA: Diagnosis not present

## 2018-02-01 DIAGNOSIS — Z1389 Encounter for screening for other disorder: Secondary | ICD-10-CM | POA: Diagnosis not present

## 2018-02-01 DIAGNOSIS — Z125 Encounter for screening for malignant neoplasm of prostate: Secondary | ICD-10-CM | POA: Diagnosis not present

## 2018-02-01 DIAGNOSIS — Z0000 Encounter for general adult medical examination without abnormal findings: Secondary | ICD-10-CM | POA: Diagnosis not present

## 2018-02-01 DIAGNOSIS — Z6828 Body mass index (BMI) 28.0-28.9, adult: Secondary | ICD-10-CM | POA: Diagnosis not present

## 2018-02-01 DIAGNOSIS — Z1211 Encounter for screening for malignant neoplasm of colon: Secondary | ICD-10-CM | POA: Diagnosis not present

## 2018-02-01 DIAGNOSIS — R52 Pain, unspecified: Secondary | ICD-10-CM

## 2018-02-01 DIAGNOSIS — M542 Cervicalgia: Secondary | ICD-10-CM | POA: Diagnosis not present

## 2018-02-01 DIAGNOSIS — D693 Immune thrombocytopenic purpura: Secondary | ICD-10-CM | POA: Diagnosis not present

## 2018-02-13 DIAGNOSIS — D693 Immune thrombocytopenic purpura: Secondary | ICD-10-CM | POA: Diagnosis not present

## 2018-02-20 DIAGNOSIS — Z1212 Encounter for screening for malignant neoplasm of rectum: Secondary | ICD-10-CM | POA: Diagnosis not present

## 2018-02-20 DIAGNOSIS — Z1211 Encounter for screening for malignant neoplasm of colon: Secondary | ICD-10-CM | POA: Diagnosis not present

## 2018-02-26 DIAGNOSIS — K0889 Other specified disorders of teeth and supporting structures: Secondary | ICD-10-CM | POA: Diagnosis not present

## 2018-04-12 DIAGNOSIS — R112 Nausea with vomiting, unspecified: Secondary | ICD-10-CM | POA: Diagnosis not present

## 2018-04-12 DIAGNOSIS — R109 Unspecified abdominal pain: Secondary | ICD-10-CM | POA: Diagnosis not present

## 2018-04-12 DIAGNOSIS — N200 Calculus of kidney: Secondary | ICD-10-CM | POA: Diagnosis not present

## 2018-04-18 DIAGNOSIS — R319 Hematuria, unspecified: Secondary | ICD-10-CM | POA: Diagnosis not present

## 2018-05-18 DIAGNOSIS — R319 Hematuria, unspecified: Secondary | ICD-10-CM | POA: Diagnosis not present

## 2018-06-06 DIAGNOSIS — H524 Presbyopia: Secondary | ICD-10-CM | POA: Diagnosis not present

## 2018-06-06 DIAGNOSIS — H52209 Unspecified astigmatism, unspecified eye: Secondary | ICD-10-CM | POA: Diagnosis not present

## 2018-06-06 DIAGNOSIS — H5213 Myopia, bilateral: Secondary | ICD-10-CM | POA: Diagnosis not present

## 2018-08-04 IMAGING — CR DG CERVICAL SPINE 2 OR 3 VIEWS
4 series · 4 of 4 positions shown · non-contrast
Comparison: None.

CLINICAL DATA: Slipped, fall backwards 3 weeks ago. Hit back of
head. Low neck pain

EXAM:
CERVICAL SPINE - 2-3 VIEW

[w cervical spine lat]
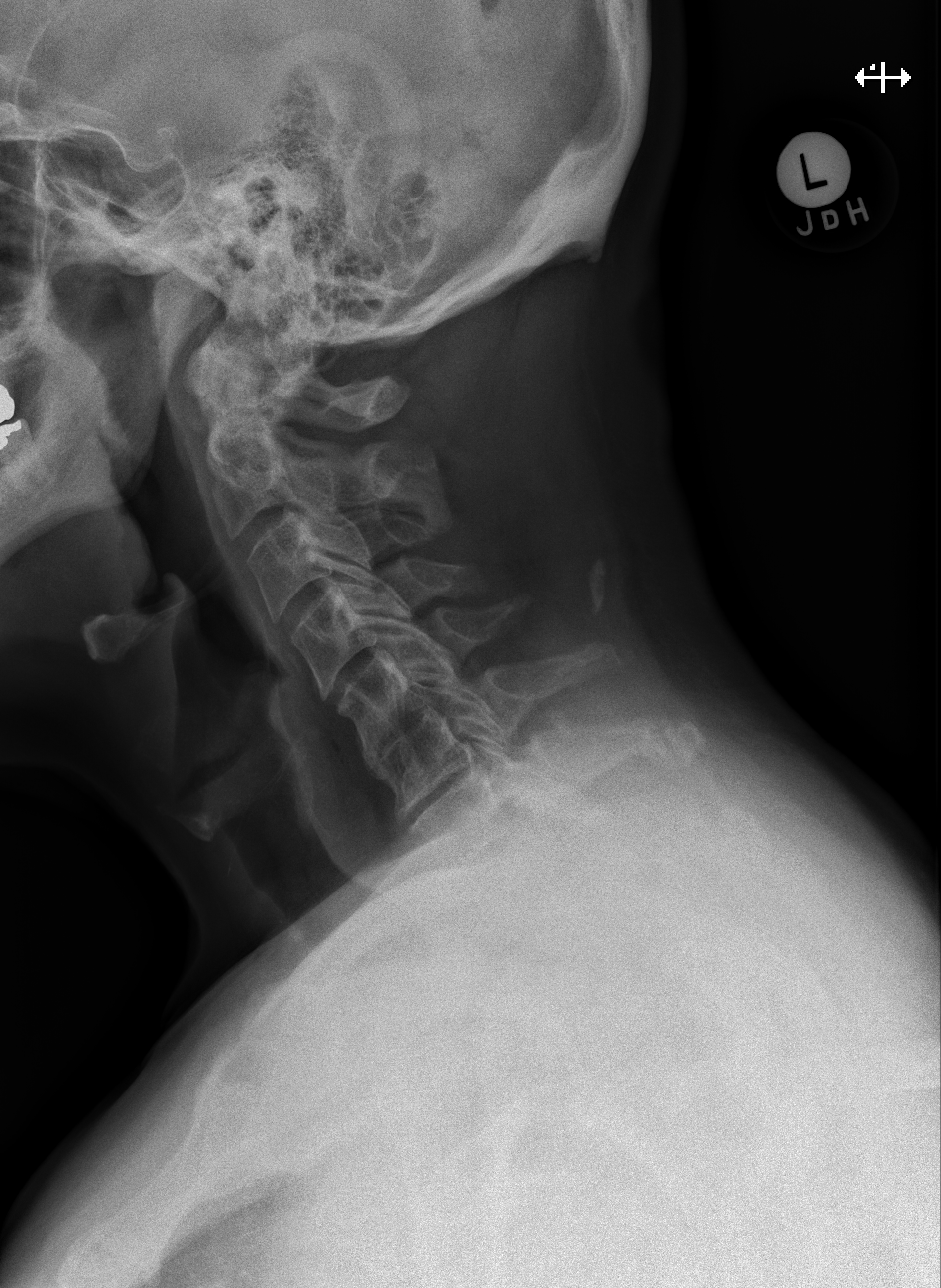

[w cervical swimmers]
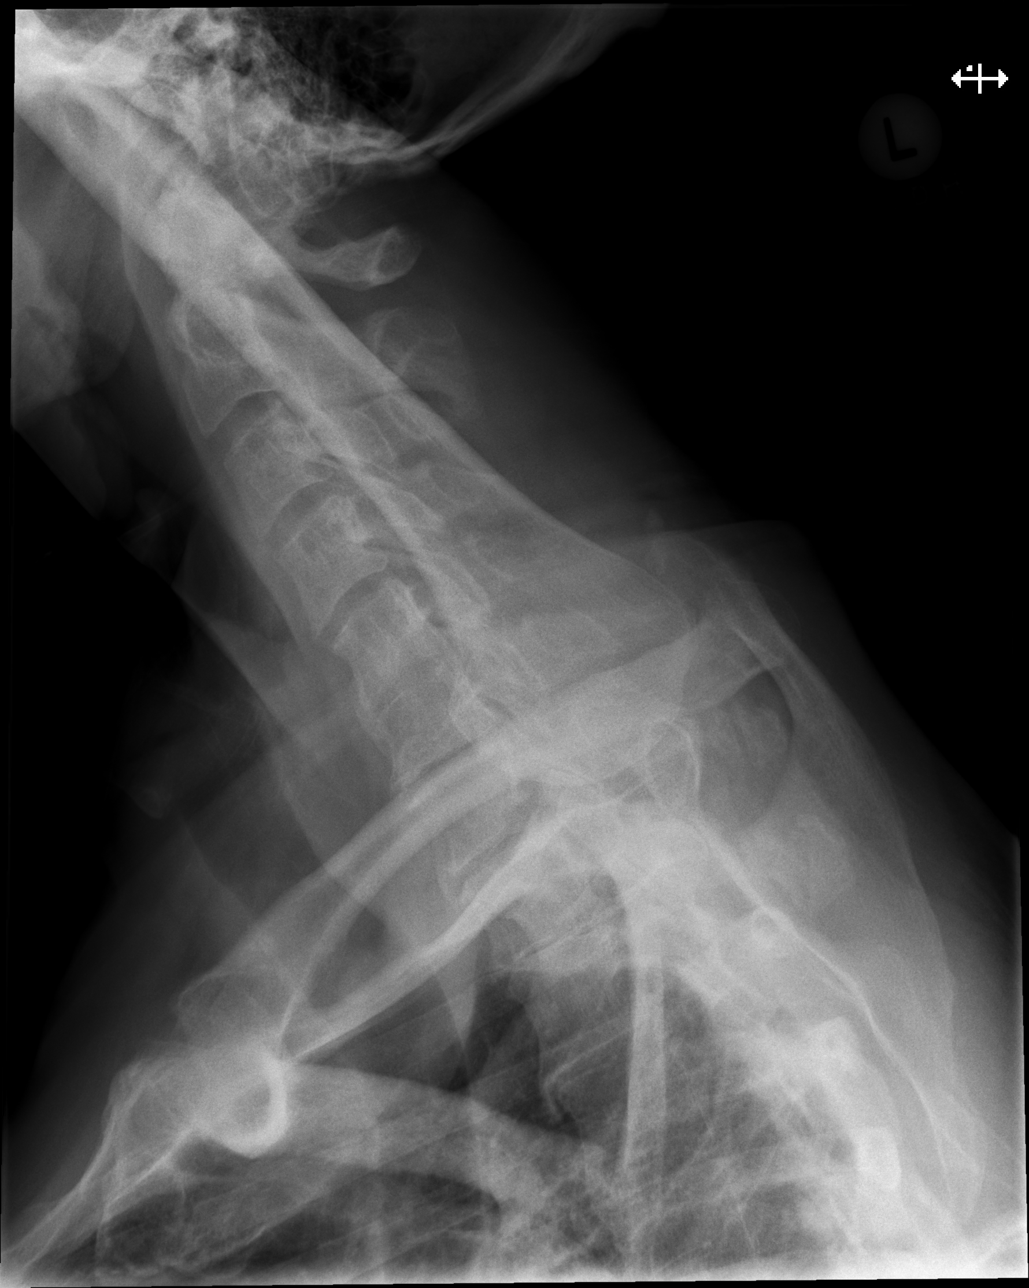

[w cervical spine ap]
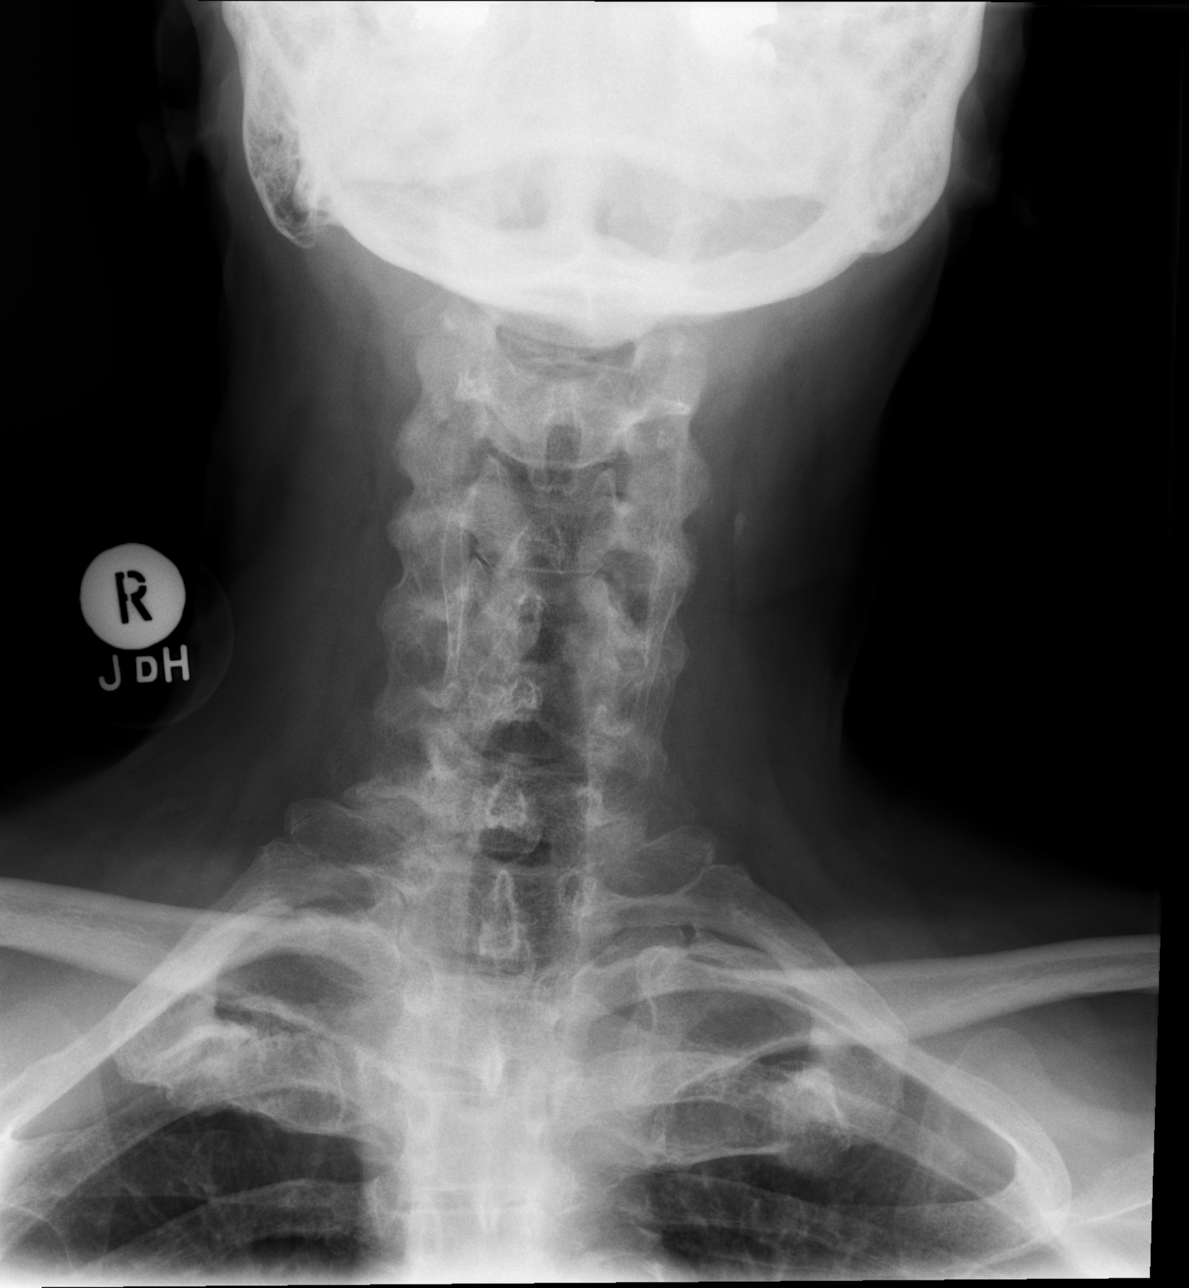

[w cervical spine odontoid]
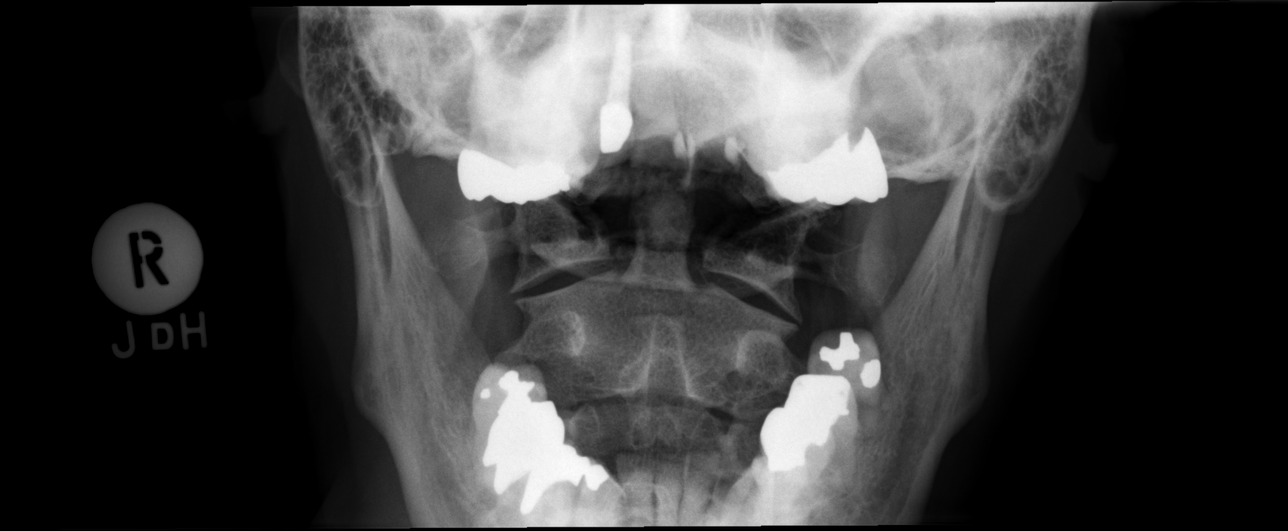

[4 of 4 positions shown; findings below may reference images not displayed]

FINDINGS: Degenerative disc disease in the lower cervical spine with disc
space narrowing and anterior spurring. Soft tissue calcification
noted posteriorly over the lower cervical spine, likely did to old
soft tissue injury. Possible small old avulsion off the C7 spinous
process which appears well corticated. No visible acute fracture. No
malalignment. Prevertebral soft tissues are normal.
IMPRESSION: Possible old C7 spinous process avulsion fracture. Posterior soft
tissue calcification. These findings may be related to old injury.
No visible acute fracture.

Mild degenerative changes in the lower cervical spine.

## 2018-09-29 DIAGNOSIS — Z23 Encounter for immunization: Secondary | ICD-10-CM | POA: Diagnosis not present

## 2019-01-04 ENCOUNTER — Other Ambulatory Visit: Payer: Self-pay | Admitting: Oncology

## 2019-01-04 DIAGNOSIS — D72821 Monocytosis (symptomatic): Secondary | ICD-10-CM

## 2019-01-04 DIAGNOSIS — D693 Immune thrombocytopenic purpura: Secondary | ICD-10-CM

## 2019-01-04 DIAGNOSIS — D7581 Myelofibrosis: Secondary | ICD-10-CM

## 2019-01-04 DIAGNOSIS — D649 Anemia, unspecified: Secondary | ICD-10-CM

## 2019-01-04 DIAGNOSIS — D72819 Decreased white blood cell count, unspecified: Secondary | ICD-10-CM

## 2019-02-07 DIAGNOSIS — Z1389 Encounter for screening for other disorder: Secondary | ICD-10-CM | POA: Diagnosis not present

## 2019-02-07 DIAGNOSIS — Z23 Encounter for immunization: Secondary | ICD-10-CM | POA: Diagnosis not present

## 2019-02-07 DIAGNOSIS — D693 Immune thrombocytopenic purpura: Secondary | ICD-10-CM | POA: Diagnosis not present

## 2019-02-07 DIAGNOSIS — M542 Cervicalgia: Secondary | ICD-10-CM | POA: Diagnosis not present

## 2019-02-07 DIAGNOSIS — Z0000 Encounter for general adult medical examination without abnormal findings: Secondary | ICD-10-CM | POA: Diagnosis not present

## 2019-02-07 DIAGNOSIS — Z6828 Body mass index (BMI) 28.0-28.9, adult: Secondary | ICD-10-CM | POA: Diagnosis not present

## 2019-02-07 DIAGNOSIS — E782 Mixed hyperlipidemia: Secondary | ICD-10-CM | POA: Diagnosis not present

## 2019-02-07 DIAGNOSIS — I10 Essential (primary) hypertension: Secondary | ICD-10-CM | POA: Diagnosis not present

## 2019-02-07 DIAGNOSIS — R7309 Other abnormal glucose: Secondary | ICD-10-CM | POA: Diagnosis not present

## 2019-03-05 ENCOUNTER — Other Ambulatory Visit: Payer: Medicare HMO

## 2019-03-05 ENCOUNTER — Telehealth: Payer: Self-pay | Admitting: Family Medicine

## 2019-03-05 NOTE — Telephone Encounter (Signed)
Pt had labs on 02/24 at [place] office, they could send results. Pt is doesn't feel safe in coming out; pt contact [phone]

## 2019-03-06 ENCOUNTER — Other Ambulatory Visit: Payer: Self-pay | Admitting: Oncology

## 2019-03-06 DIAGNOSIS — D693 Immune thrombocytopenic purpura: Secondary | ICD-10-CM

## 2019-03-06 DIAGNOSIS — D72819 Decreased white blood cell count, unspecified: Secondary | ICD-10-CM

## 2019-03-06 DIAGNOSIS — D72821 Monocytosis (symptomatic): Secondary | ICD-10-CM

## 2019-03-15 ENCOUNTER — Encounter: Payer: Self-pay | Admitting: *Deleted

## 2019-03-15 NOTE — Telephone Encounter (Signed)
Per dr Beryle Beams he has tried to reach out to pt several times, closing encounter

## 2019-03-19 ENCOUNTER — Encounter: Payer: Medicare HMO | Admitting: Oncology

## 2019-03-21 ENCOUNTER — Telehealth: Payer: Self-pay | Admitting: Hematology

## 2019-03-21 ENCOUNTER — Encounter: Payer: Self-pay | Admitting: Hematology

## 2019-03-21 NOTE — Telephone Encounter (Signed)
Received a hem referral from Dr. Beryle Beams. Pt has been cld and scheduled to see Dr. Maylon Peppers on 6/3 at 1pm. Due to COVID-19 pt wanted to wait at a later date for an appt. Pt aware to arrive 15 minutes early. Letter mailed.

## 2019-05-09 NOTE — Progress Notes (Addendum)
[facility] NOTE  Patient Care Team: Antony Contras, MD as PCP - General (Family Medicine) Annia Belt, MD as Consulting Physician (Hematology and Oncology)  HEME/ONC OVERVIEW: 1. 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 Beryle Beams  -Plts between 40-60k dating back to 2013 -2009 - 2017: treated with thrombopoietin agonist (Promacta or N-plate) by Dr. 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 Tamela Oddi in [place]   2. 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 Beryle Beams for over a decade for chronic thrombocytopenia, who recently retired.  Patient has had chronic thrombocytopenia with platelet count between 40 and 60k dating back to at least 2013.  He received some time both thrombopoietin agonist (Promacta or M-plate) from 2009 to late 2017.  He developed new onset anemia in late 2017, prompting bone marrow biopsy, which showed a hypercellular marrow with pan-myeloid proliferation, including atypical megakaryocytes, suggestive of primary myelofibrosis.  Reticulin stain showed moderate to focally market increase in reticulin fibers, also suggestive of myelofibrosis.  No fish study was done at that  time.  The peripheral blood panel showed a mutation and MPL but negative for JAK2 and CAL-R.  Thrombopoietin agonist was discontinued and 10/2016, and patient has been monitored periodically without requiring any treatment. -I reviewed the bone marrow biopsy and lab studies in detail with the patient -Given the presence of megakaryocytic proliferation and atypia, hypercellular marrow, and presence of MPL mutation, this is at least suggestive of primary myelofibrosis  -I have ordered repeat CBC, CMP, peripheral smear and serum copper level today  -I personably reviewed the peripheral blood smear, which showed a few scattered tear-drop RBC's, some immature platelets with occasional giant platelets, and a few neutrophils with hyposegmentation; there were no circulating blasts or platelet clumping -As he has not had any abdominal imaging to assess for hepatomegaly or splenomegaly, I have also ordered abdominal ultrasound -Due to to lack of FISH studies from 2017, the MF cannot be risk stratified, but as he has had stable chronic thrombocytopenia and leukopenia, this suggests somewhat low risk myelofibrosis -In the absence of worsening cytopenias or clinically concerning symptoms, repeat bone marrow biopsy is unlikely to change his management at this time  Leukopenia with neutropenia -WBC 2.4k with ANC ~900, stable -Patient denies any symptoms of infection  -Peripheral blood smear results as above -Copper level ordered -We will monitor it for now   Intermittent rash -Chronic at least for a year but no previous evaluation -Given the fluctuating nature of the rash, especially improvement in his son, this may suggest psoriasis -Exam notable for very faint and small patches of rash in the bilateral lower extremities  -I discussed with the patient that this is unlikely to be related to his thrombocytopenia and neutropenia -I offered to refer the patient to dermatology, but the patient declined with  improvement in the rash -I counseled the  patient that if the rash worsens, he should contact his primary care physician for further evaluation  Orders Placed This Encounter  Procedures  . US Abdomen Complete    Standing Status:   Future    Standing Expiration Date:   05/22/2020    Order Specific Question:   Reason for Exam (SYMPTOM  OR DIAGNOSIS REQUIRED)    Answer:   Thrombocytopenia, evaluate liver and spleen    Order Specific Question:   Preferred imaging location?    Answer:   [facility]  . CBC with Differential ([facility] Only)    Standing Status:   Future    Number of Occurrences:   1    Standing Expiration Date:   06/26/2020  . CMP ([facility] only)    Standing Status:   Future    Number of Occurrences:   1    Standing Expiration Date:   06/26/2020  . Lactate dehydrogenase    Standing Status:   Future    Number of Occurrences:   1    Standing Expiration Date:   06/26/2020  . Save Smear ([facility])    Standing Status:   Future    Standing Expiration Date:   05/22/2020  . Copper, serum    Standing Status:   Future    Number of Occurrences:   1    Standing Expiration Date:   06/26/2020   All questions were answered. The patient knows to call the clinic with any problems, questions or concerns.  Return in 6 months for labs and clinic follow-up.   Tish Men, MD 05/23/2019 2:43 PM   CHIEF COMPLAINTS/PURPOSE OF CONSULTATION:  "I tried to talk Dr. 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  Patient has been followed by Dr. Beryle Beams for chronic thrombocytopenia and neutropenia for over a decade.  He had been treated with a variety of thrombopoietin agonists, including Promacta and endplate.  He had a bone marrow biopsy in 10/2016 that showed  abnormal megakaryocytes and presence of fibrosis, suggesting possible myelofibrosis.  He was ultimately taken off Promacta in 10/2016 by Dr. Beryle Beams and has been monitored periodically with labs.  Patient reports that since last visit, he has been doing relatively well, and denies constitutional symptoms, such as fever, night sweats, weight loss, lymphadenopathy, chest pain, dyspnea, abdominal pain, or abnormal bleeding/bruising.  He has had intermittent erythematous patchy rash in the lower extremities for at least the past year, but he has not discussed this with his primary care physician.  He reports that since being out in the sun for the past few weeks, the rash in the bilateral lower extremity has improved significantly.  He denies any other complaint today.   MEDICAL HISTORY:  Past Medical History:  Diagnosis Date  . Abscess    on chest  . 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 Clotting disorder ([place])   . Hypertension   . Leukopenia 10/17/2012    SURGICAL HISTORY: Past Surgical History:  Procedure Laterality Date  . APPENDECTOMY  1955  . CYSTECTOMY  2000   left chest wall  . Deep excision of left anterior chest wall mass.  2005   ? infected seb cyst    SOCIAL HISTORY: Social History   Socioeconomic History  . Marital  status: Married    Spouse name: Not on file  . Number of children: Not on file  . Years of education: Not on file  . Highest education level: Not on file  Occupational History  . Occupation: Restaurant manager, fast food    Comment: semi-retired 2013  Social Needs  . Financial resource strain: Not on file  . Food insecurity:    Worry: Not on file    Inability: Not on file  . Transportation needs:    Medical: Not on file    Non-medical: Not on file  Tobacco Use  . Smoking status: Never Smoker  . Smokeless tobacco: Never Used  Substance and Sexual Activity  . Alcohol use: Yes    Alcohol/week:  0.0 standard drinks  . Drug use: No  . Sexual activity: Not on file  Lifestyle  . Physical activity:    Days per week: Not on file    Minutes per session: Not on file  . Stress: Not on file  Relationships  . Social connections:    Talks on phone: Not on file    Gets together: Not on file    Attends religious service: Not on file    Active member of club or organization: Not on file    Attends meetings of clubs or organizations: Not on file    Relationship status: Not on file  . Intimate partner violence:    Fear of current or ex partner: Not on file    Emotionally abused: Not on file    Physically abused: Not on file    Forced sexual activity: Not on file  Other Topics Concern  . Not on file  Social History Narrative  . Not on file    FAMILY HISTORY: Family History  Problem Relation Age of Onset  . Heart disease Father   . Cancer Brother        bone and liver    ALLERGIES:  is allergic to penicillins.  MEDICATIONS:  Current Outpatient Medications  Medication Sig Dispense Refill  . losartan-hydrochlorothiazide (HYZAAR) 50-12.5 MG tablet Take 1 tablet by mouth daily.    Marland Kitchen amLODipine (NORVASC) 5 MG tablet     . metoprolol succinate (TOPROL-XL) 100 MG 24 hr tablet Take 100 mg by mouth daily.      No current facility-administered medications for this visit.     REVIEW OF SYSTEMS:   Constitutional: ( - ) fevers, ( - )  chills , ( - ) night sweats Eyes: ( - ) blurriness of vision, ( - ) double vision, ( - ) watery eyes Ears, nose, mouth, throat, and face: ( - ) mucositis, ( - ) sore throat Respiratory: ( - ) cough, ( - ) dyspnea, ( - ) wheezes Cardiovascular: ( - ) palpitation, ( - ) chest discomfort, ( - ) lower extremity swelling Gastrointestinal:  ( - ) nausea, ( - ) heartburn, ( - ) change in bowel habits Skin: ( + ) abnormal skin rashes Lymphatics: ( - ) new lymphadenopathy, ( - ) easy bruising Neurological: ( - ) numbness, ( - ) tingling, ( - ) new  weaknesses Behavioral/Psych: ( - ) mood change, ( - ) new changes  All other systems were reviewed with the patient and are negative.  PHYSICAL EXAMINATION: ECOG PERFORMANCE STATUS: 1 - Symptomatic but completely ambulatory  Vitals:   05/23/19 1316  BP: 139/89  Pulse: 64  Resp: 18  Temp: 98.7 F (37.1 C)  SpO2: 95%   Filed Weights  05/23/19 1316  Weight: 206 lb 6.4 oz (93.6 kg)    GENERAL: alert, no distress and comfortable SKIN: a few scattered, very faint, small areas of plaques over the bilateral lower extremities EYES: conjunctiva are pink and non-injected, sclera clear OROPHARYNX: no exudate, no erythema; lips, buccal mucosa, and tongue normal  NECK: supple, non-tender LYMPH:  no palpable lymphadenopathy in the cervical LUNGS: clear to auscultation with normal breathing effort HEART: regular rate & rhythm, no murmurs, no lower extremity edema ABDOMEN: soft, non-tender, non-distended, normal bowel sounds Musculoskeletal: no cyanosis of digits and no clubbing  PSYCH: alert & oriented x 3, fluent speech NEURO: no focal motor/sensory deficits  LABORATORY DATA:  I have reviewed the data as listed Lab Results  Component Value Date   WBC 2.4 (L) 05/23/2019   HGB 13.4 05/23/2019   HCT 40.8 05/23/2019   MCV 92.5 05/23/2019   PLT 60 (L) 05/23/2019   Lab Results  Component Value Date   NA 143 10/14/2017   K 3.8 10/14/2017   CL 100 10/14/2017   CO2 28 10/14/2017   PATHOLOGY: I have reviewed the pathology reports as documented in the oncologist history.

## 2019-05-22 ENCOUNTER — Telehealth: Payer: Self-pay | Admitting: *Deleted

## 2019-05-22 NOTE — Telephone Encounter (Signed)
Received call from pt to confirm his appt tomorrow.  His appt is at 1pm. Covid screening questions done. Reminded that visitor restrictions are still in place.

## 2019-05-23 ENCOUNTER — Encounter: Payer: Self-pay | Admitting: Hematology

## 2019-05-23 ENCOUNTER — Inpatient Hospital Stay: Payer: Medicare HMO

## 2019-05-23 ENCOUNTER — Other Ambulatory Visit: Payer: Self-pay

## 2019-05-23 ENCOUNTER — Other Ambulatory Visit: Payer: Self-pay | Admitting: Hematology

## 2019-05-23 ENCOUNTER — Telehealth: Payer: Self-pay | Admitting: Hematology

## 2019-05-23 ENCOUNTER — Inpatient Hospital Stay: Payer: Medicare HMO | Attending: Hematology | Admitting: Hematology

## 2019-05-23 VITALS — BP 139/89 | HR 64 | Temp 98.7°F | Resp 18 | Ht 71.0 in | Wt 206.4 lb

## 2019-05-23 DIAGNOSIS — D696 Thrombocytopenia, unspecified: Secondary | ICD-10-CM | POA: Diagnosis not present

## 2019-05-23 DIAGNOSIS — D693 Immune thrombocytopenic purpura: Secondary | ICD-10-CM

## 2019-05-23 DIAGNOSIS — D709 Neutropenia, unspecified: Secondary | ICD-10-CM | POA: Insufficient documentation

## 2019-05-23 DIAGNOSIS — Z808 Family history of malignant neoplasm of other organs or systems: Secondary | ICD-10-CM | POA: Insufficient documentation

## 2019-05-23 DIAGNOSIS — R21 Rash and other nonspecific skin eruption: Secondary | ICD-10-CM | POA: Diagnosis not present

## 2019-05-23 DIAGNOSIS — D72819 Decreased white blood cell count, unspecified: Secondary | ICD-10-CM

## 2019-05-23 LAB — CMP (CANCER CENTER ONLY)
ALT: 23 U/L (ref 0–44)
AST: 23 U/L (ref 15–41)
Albumin: 3.6 g/dL (ref 3.5–5.0)
Alkaline Phosphatase: 45 U/L (ref 38–126)
Anion gap: 7 (ref 5–15)
BUN: 14 mg/dL (ref 8–23)
CO2: 30 mmol/L (ref 22–32)
Calcium: 9 mg/dL (ref 8.9–10.3)
Chloride: 104 mmol/L (ref 98–111)
Creatinine: 1.15 mg/dL (ref 0.61–1.24)
GFR, Est AFR Am: >60 mL/min (ref >60)
GFR, Estimated: >60 mL/min (ref >60)
Glucose, Bld: 98 mg/dL (ref 70–99)
Potassium: 4.1 mmol/L (ref 3.5–5.1)
Sodium: 141 mmol/L (ref 135–145)
Total Bilirubin: 0.7 mg/dL (ref 0.3–1.2)
Total Protein: 6.5 g/dL (ref 6.5–8.1)

## 2019-05-23 LAB — CBC WITH DIFFERENTIAL (CANCER CENTER ONLY)
Abs Immature Granulocytes: 0.03 10*3/uL (ref 0.00–0.07)
Basophils Absolute: 0 10*3/uL (ref 0.0–0.1)
Basophils Relative: 1 %
Eosinophils Absolute: 0 10*3/uL (ref 0.0–0.5)
Eosinophils Relative: 0 %
HCT: 40.8 % (ref 39.0–52.0)
Hemoglobin: 13.4 g/dL (ref 13.0–17.0)
Immature Granulocytes: 1 %
Lymphocytes Relative: 30 %
Lymphs Abs: 0.7 10*3/uL (ref 0.7–4.0)
MCH: 30.4 pg (ref 26.0–34.0)
MCHC: 32.8 g/dL (ref 30.0–36.0)
MCV: 92.5 fL (ref 80.0–100.0)
Monocytes Absolute: 0.8 10*3/uL (ref 0.1–1.0)
Monocytes Relative: 32 %
Neutro Abs: 0.9 10*3/uL — ABNORMAL LOW (ref 1.7–7.7)
Neutrophils Relative %: 36 %
Platelet Count: 60 10*3/uL — ABNORMAL LOW (ref 150–400)
RBC: 4.41 MIL/uL (ref 4.22–5.81)
RDW: 11.5 % (ref 11.5–15.5)
WBC Count: 2.4 10*3/uL — ABNORMAL LOW (ref 4.0–10.5)
nRBC: 0 % (ref 0.0–0.2)

## 2019-05-23 LAB — LACTATE DEHYDROGENASE: LDH: 230 U/L — ABNORMAL HIGH (ref 98–192)

## 2019-05-23 LAB — SAVE SMEAR(SSMR), FOR PROVIDER SLIDE REVIEW

## 2019-05-23 NOTE — Telephone Encounter (Signed)
Called and spoke with patient regarding appointments per 6/3 los

## 2019-05-24 ENCOUNTER — Telehealth: Payer: Self-pay | Admitting: Hematology

## 2019-05-24 NOTE — Telephone Encounter (Signed)
Per 6/3 los Return to be scheduled at [facility]

## 2019-05-27 LAB — COPPER, SERUM: Copper: 92 ug/dL (ref 72–166)

## 2019-09-27 DIAGNOSIS — Z23 Encounter for immunization: Secondary | ICD-10-CM | POA: Diagnosis not present

## 2019-11-20 ENCOUNTER — Inpatient Hospital Stay: Payer: Medicare HMO

## 2019-11-20 ENCOUNTER — Inpatient Hospital Stay: Payer: Medicare HMO | Admitting: Hematology

## 2019-11-22 ENCOUNTER — Ambulatory Visit: Payer: Medicare HMO | Admitting: Hematology

## 2019-11-22 ENCOUNTER — Other Ambulatory Visit: Payer: Medicare HMO

## 2019-12-26 DIAGNOSIS — E782 Mixed hyperlipidemia: Secondary | ICD-10-CM | POA: Diagnosis not present

## 2019-12-26 DIAGNOSIS — I10 Essential (primary) hypertension: Secondary | ICD-10-CM | POA: Diagnosis not present

## 2020-02-27 DIAGNOSIS — I10 Essential (primary) hypertension: Secondary | ICD-10-CM | POA: Diagnosis not present

## 2020-02-27 DIAGNOSIS — E782 Mixed hyperlipidemia: Secondary | ICD-10-CM | POA: Diagnosis not present

## 2020-02-27 DIAGNOSIS — Z6829 Body mass index (BMI) 29.0-29.9, adult: Secondary | ICD-10-CM | POA: Diagnosis not present

## 2020-02-27 DIAGNOSIS — Z1389 Encounter for screening for other disorder: Secondary | ICD-10-CM | POA: Diagnosis not present

## 2020-02-27 DIAGNOSIS — D693 Immune thrombocytopenic purpura: Secondary | ICD-10-CM | POA: Diagnosis not present

## 2020-02-27 DIAGNOSIS — Z0000 Encounter for general adult medical examination without abnormal findings: Secondary | ICD-10-CM | POA: Diagnosis not present

## 2020-02-27 DIAGNOSIS — R739 Hyperglycemia, unspecified: Secondary | ICD-10-CM | POA: Diagnosis not present

## 2020-05-09 DIAGNOSIS — I10 Essential (primary) hypertension: Secondary | ICD-10-CM | POA: Diagnosis not present

## 2020-05-09 DIAGNOSIS — E782 Mixed hyperlipidemia: Secondary | ICD-10-CM | POA: Diagnosis not present

## 2020-05-20 ENCOUNTER — Inpatient Hospital Stay: Payer: Medicare HMO

## 2020-05-20 ENCOUNTER — Inpatient Hospital Stay: Payer: Medicare HMO | Admitting: Family

## 2020-09-24 DIAGNOSIS — Z23 Encounter for immunization: Secondary | ICD-10-CM | POA: Diagnosis not present

## 2020-09-29 ENCOUNTER — Ambulatory Visit: Payer: Medicare HMO | Attending: Internal Medicine

## 2020-09-29 DIAGNOSIS — Z23 Encounter for immunization: Secondary | ICD-10-CM

## 2020-09-29 NOTE — Progress Notes (Signed)
   Covid-19 Vaccination Clinic  Name:  Billy [name]    MRN: [id number] DOB: [birth year]  09/29/2020  Billy [name] was observed post Covid-19 immunization for 15 minutes without incident. He was provided with Vaccine Information Sheet and instruction to access the V-Safe system.   Billy [name] was instructed to call 911 with any severe reactions post vaccine: Marland Kitchen Difficulty breathing  . Swelling of face and throat  . A fast heartbeat  . A bad rash all over body  . Dizziness and weakness

## 2021-02-20 DIAGNOSIS — U071 COVID-19: Secondary | ICD-10-CM | POA: Diagnosis not present

## 2021-02-20 DIAGNOSIS — Z20822 Contact with and (suspected) exposure to covid-19: Secondary | ICD-10-CM | POA: Diagnosis not present

## 2021-02-21 ENCOUNTER — Ambulatory Visit (HOSPITAL_COMMUNITY)
Admission: RE | Admit: 2021-02-21 | Discharge: 2021-02-21 | Disposition: A | Discharge status: Home / Self Care | Payer: Medicare HMO | Source: Ambulatory Visit | Attending: Pulmonary Disease | Admitting: Pulmonary Disease

## 2021-02-21 ENCOUNTER — Other Ambulatory Visit: Payer: Self-pay | Admitting: Physician Assistant

## 2021-02-21 DIAGNOSIS — D7581 Myelofibrosis: Secondary | ICD-10-CM | POA: Insufficient documentation

## 2021-02-21 DIAGNOSIS — I10 Essential (primary) hypertension: Secondary | ICD-10-CM

## 2021-02-21 DIAGNOSIS — U071 COVID-19: Secondary | ICD-10-CM | POA: Insufficient documentation

## 2021-02-21 DIAGNOSIS — D696 Thrombocytopenia, unspecified: Secondary | ICD-10-CM

## 2021-02-21 MED ORDER — DIPHENHYDRAMINE HCL 50 MG/ML IJ SOLN: 50.0000 mg | Freq: Once | INTRAMUSCULAR | Status: DC | PRN

## 2021-02-21 MED ORDER — METHYLPREDNISOLONE SODIUM SUCC 125 MG IJ SOLR: 125.0000 mg | Freq: Once | INTRAMUSCULAR | Status: DC | PRN

## 2021-02-21 MED ORDER — EPINEPHRINE 0.3 MG/0.3ML IJ SOAJ: 0.3000 mg | Freq: Once | INTRAMUSCULAR | Status: DC | PRN

## 2021-02-21 MED ORDER — SOTROVIMAB 500 MG/8ML IV SOLN
500.0000 mg | Freq: Once | INTRAVENOUS | Status: AC
  Administered 2021-02-21: 500 mg via INTRAVENOUS

## 2021-02-21 MED ORDER — SODIUM CHLORIDE 0.9 % IV SOLN: INTRAVENOUS | Status: DC | PRN

## 2021-02-21 MED ORDER — FAMOTIDINE IN NACL 20-0.9 MG/50ML-% IV SOLN: 20.0000 mg | Freq: Once | INTRAVENOUS | Status: DC | PRN

## 2021-02-21 MED ORDER — ALBUTEROL SULFATE HFA 108 (90 BASE) MCG/ACT IN AERS: 2.0000 | INHALATION_SPRAY | Freq: Once | RESPIRATORY_TRACT | Status: DC | PRN

## 2021-02-21 NOTE — Progress Notes (Signed)
Patient reviewed Fact Sheet for Patients, Parents, and Caregivers for Emergency Use Authorization (EUA) of sotrovimab for the Treatment of Coronavirus. Patient also reviewed and is agreeable to the estimated cost of treatment. Patient is agreeable to proceed.   

## 2021-02-21 NOTE — Discharge Instructions (Signed)

## 2021-02-21 NOTE — Progress Notes (Signed)
I connected by phone with Billy [name] on 02/21/2021 at 10:33 AM to discuss the potential use of a new treatment for mild to moderate COVID-19 viral infection in non-hospitalized patients.  This patient is a 80 y.o. male that meets the FDA criteria for Emergency Use Authorization of COVID monoclonal antibody sotrovimab.   Has a (+) direct SARS-CoV-2 viral test result  Has mild or moderate COVID-19   Is NOT hospitalized due to COVID-19  Is within 10 days of symptom onset  Has at least one of the high risk factor(s) for progression to severe COVID-19 and/or hospitalization as defined in EUA.  Specific high risk criteria : Older age (>/= 79 yo) and Cardiovascular disease or hypertension   I have spoken and communicated the following to the patient or parent/caregiver regarding COVID monoclonal antibody treatment:  1. FDA has authorized the emergency use for the treatment of mild to moderate COVID-19 in adults and pediatric patients with positive results of direct SARS-CoV-2 viral testing who are 63 years of age and older weighing at least 40 kg, and who are at high risk for progressing to severe COVID-19 and/or hospitalization.  2. The significant known and potential risks and benefits of COVID monoclonal antibody, and the extent to which such potential risks and benefits are unknown.  3. Information on available alternative treatments and the risks and benefits of those alternatives, including clinical trials.  4. Patients treated with COVID monoclonal antibody should continue to self-isolate and use infection control measures (e.g., wear mask, isolate, social distance, avoid sharing personal items, clean and disinfect "high touch" surfaces, and frequent handwashing) according to CDC guidelines.   5. The patient or parent/caregiver has the option to accept or refuse COVID monoclonal antibody treatment.  After reviewing this information with the patient, the patient has agreed to receive  one of the available covid 19 monoclonal antibodies and will be provided an appropriate fact sheet prior to infusion.  Sx onset 3/1. Set up for infusion on 3/5 @ 12:30pm. Directions given to [facility]. Pt is aware that insurance will be charged an infusion fee. Pt is fully vaccinated but not recent labs for paxlovid.   Billy [name] 02/21/2021 10:33 AM

## 2021-02-21 NOTE — Progress Notes (Signed)
Diagnosis: COVID-19  Physician: Dr. Patrick Wright  Procedure: Covid Infusion Clinic Med: Sotrovimab infusion - Provided patient with sotrovimab fact sheet for patients, parents, and caregivers prior to infusion.   Complications: No immediate complications noted  Discharge: Discharged home    

## 2021-02-26 ENCOUNTER — Ambulatory Visit: Payer: Self-pay | Admitting: *Deleted

## 2021-02-26 NOTE — Telephone Encounter (Signed)
Patient's wife called and was disconnected with another caller to review patient's symptoms. Patient's wife reports patient was diagnosed with covid Friday 02/20/21 and received MAB infusion on Saturday 02/21/21. Patient was feeling some better and now is noted with increase in coughing and upper chest congestion. Coughing up clear mucus. Coughing causes choking episodes while coughing. Patient's wife reports patient has been taking delsym cough medicine and mucinex as ordered. Patient's wife would like to know is there any other medication that can be given for persistent symptoms. Instructed patient's wife to call patient's PCP and request if additional medication can be given. reviewed to keep patient hydrated and contact PCP. Care advise given. Patient's wife verbalized understanding of care advise and to call back if symptoms worsen.   Reason for Disposition . [1] PERSISTING SYMPTOMS OF COVID-19 AND [2] NEW symptom AND [3] could be serious  Answer Assessment - Initial Assessment Questions 1. COVID-19 ONSET: "When did the symptoms of COVID-19 first start?"     Last week  2. DIAGNOSIS CONFIRMATION: "How were you diagnosed?" (e.g., COVID-19 oral or nasal viral test; COVID-19 antibody test; doctor visit)     Patient's wife reports dx on Friday  3. MAIN SYMPTOM:  "What is your main concern or symptom right now?" (e.g., breathing difficulty, cough, fatigue. loss of smell)     Continues to have cough and upper chest congestion 4. SYMPTOM ONSET: "When did the  symptoms  start?"     Since saturday 5. BETTER-SAME-WORSE: "Are you getting better, staying the same, or getting worse over the last 1 to 2 weeks?"     Was getting better after MAB infusion on Saturday  6. RECENT MEDICAL VISIT: "Have you been seen by a healthcare provider (doctor, NP, PA) for these persisting COVID-19 symptoms?" If Yes, ask: "When were you seen?" (e.g., date)     Yes received the MAB infusion 7. COUGH: "Do you have a cough?" If Yes,  ask: "How bad is the cough?"       Yes . Worse with choking cough at times. Sputum clear 8. FEVER: "Do you have a fever?" If Yes, ask: "What is your temperature, how was it measured, and when did it start?"     na 9. BREATHING DIFFICULTY: "Are you having any trouble breathing?" If Yes, ask: "How bad is your breathing?" (e.g., mild, moderate, severe)    - MILD: No SOB at rest, mild SOB with walking, speaks normally in sentences, can lie down, no retractions, pulse < 100.    - MODERATE: SOB at rest, SOB with minimal exertion and prefers to sit, cannot lie down flat, speaks in phrases, mild retractions, audible wheezing, pulse 100-120.    - SEVERE: Very SOB at rest, speaks in single words, struggling to breathe, sitting hunched forward, retractions, pulse > 120       na 10. HIGH RISK DISEASE: "Do you have any chronic medical problems?" (e.g., asthma, heart or lung disease, weak immune system, obesity, etc.)        11. VACCINE: "Have you gotten the COVID-19 vaccine?" If Yes, ask: "Which one, how many shots, when did you get it?"       Na  12. BOOSTER: "Have you received your COVID-19 booster?" If Yes, ask: "Which one and when did you get it?"       na 13. PREGNANCY: "Is there any chance you are pregnant?" "When was your last menstrual period?"       na 14. OTHER SYMPTOMS: "Do you have  any other symptoms?"  (e.g., fatigue, headache, muscle pain, weakness)       Cough  15. O2 SATURATION MONITOR:  "Do you use an oxygen saturation monitor (pulse oximeter) at home?" If Yes, ask "What is your reading (oxygen level) today?" "What is your usual oxygen saturation reading?" (e.g., 95%)       na  Protocols used: CORONAVIRUS (COVID-19) PERSISTING SYMPTOMS FOLLOW-UP CALL-A-AH

## 2021-03-23 DIAGNOSIS — D693 Immune thrombocytopenic purpura: Secondary | ICD-10-CM | POA: Diagnosis not present

## 2021-03-23 DIAGNOSIS — Z8616 Personal history of COVID-19: Secondary | ICD-10-CM | POA: Diagnosis not present

## 2021-03-23 DIAGNOSIS — I10 Essential (primary) hypertension: Secondary | ICD-10-CM | POA: Diagnosis not present

## 2021-03-26 ENCOUNTER — Telehealth: Payer: Self-pay | Admitting: Physician Assistant

## 2021-03-26 NOTE — Telephone Encounter (Signed)
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 [name] returned my call and has been scheduled to see Billy [name] on 4/11 at 2pm. Pt aware to arrive 20 minutes early.

## 2021-03-30 ENCOUNTER — Other Ambulatory Visit: Payer: Self-pay

## 2021-03-30 ENCOUNTER — Inpatient Hospital Stay: Payer: Medicare HMO | Attending: Physician Assistant

## 2021-03-30 ENCOUNTER — Inpatient Hospital Stay: Payer: Medicare HMO | Admitting: Physician Assistant

## 2021-03-30 VITALS — BP 104/54 | HR 63 | Temp 97.8°F | Resp 16 | Ht 71.0 in | Wt 198.5 lb

## 2021-03-30 DIAGNOSIS — D696 Thrombocytopenia, unspecified: Secondary | ICD-10-CM | POA: Diagnosis not present

## 2021-03-30 DIAGNOSIS — D61818 Other pancytopenia: Secondary | ICD-10-CM | POA: Diagnosis not present

## 2021-03-30 DIAGNOSIS — D649 Anemia, unspecified: Secondary | ICD-10-CM | POA: Insufficient documentation

## 2021-03-30 DIAGNOSIS — Z8616 Personal history of COVID-19: Secondary | ICD-10-CM | POA: Insufficient documentation

## 2021-03-30 LAB — RETIC PANEL
Immature Retic Fract: 20.8 % — ABNORMAL HIGH (ref 2.3–15.9)
RBC.: 3.19 MIL/uL — ABNORMAL LOW (ref 4.22–5.81)
Retic Count, Absolute: 75 10*3/uL (ref 19.0–186.0)
Retic Ct Pct: 2.4 % (ref 0.4–3.1)
Reticulocyte Hemoglobin: 32.2 pg (ref >27.9)

## 2021-03-30 LAB — VITAMIN B12: Vitamin B-12: 348 pg/mL (ref 180–914)

## 2021-03-30 LAB — FOLATE: Folate: 19.1 ng/mL (ref >5.9)

## 2021-03-30 LAB — CBC WITH DIFFERENTIAL (CANCER CENTER ONLY)
Abs Immature Granulocytes: 0.25 10*3/uL — ABNORMAL HIGH (ref 0.00–0.07)
Basophils Absolute: 0 10*3/uL (ref 0.0–0.1)
Basophils Relative: 1 %
Eosinophils Absolute: 0 10*3/uL (ref 0.0–0.5)
Eosinophils Relative: 1 %
HCT: 30.7 % — ABNORMAL LOW (ref 39.0–52.0)
Hemoglobin: 9.7 g/dL — ABNORMAL LOW (ref 13.0–17.0)
Immature Granulocytes: 10 %
Lymphocytes Relative: 27 %
Lymphs Abs: 0.6 10*3/uL — ABNORMAL LOW (ref 0.7–4.0)
MCH: 30.1 pg (ref 26.0–34.0)
MCHC: 31.6 g/dL (ref 30.0–36.0)
MCV: 95.3 fL (ref 80.0–100.0)
Monocytes Absolute: 0.6 10*3/uL (ref 0.1–1.0)
Monocytes Relative: 25 %
Neutro Abs: 0.9 10*3/uL — ABNORMAL LOW (ref 1.7–7.7)
Neutrophils Relative %: 36 %
Platelet Count: 75 10*3/uL — ABNORMAL LOW (ref 150–400)
RBC: 3.22 MIL/uL — ABNORMAL LOW (ref 4.22–5.81)
RDW: 17 % — ABNORMAL HIGH (ref 11.5–15.5)
WBC Count: 2.4 10*3/uL — ABNORMAL LOW (ref 4.0–10.5)
nRBC: 1.3 % — ABNORMAL HIGH (ref 0.0–0.2)

## 2021-03-30 LAB — CMP (CANCER CENTER ONLY)
ALT: 15 U/L (ref 0–44)
AST: 22 U/L (ref 15–41)
Albumin: 3.7 g/dL (ref 3.5–5.0)
Alkaline Phosphatase: 46 U/L (ref 38–126)
Anion gap: 10 (ref 5–15)
BUN: 15 mg/dL (ref 8–23)
CO2: 27 mmol/L (ref 22–32)
Calcium: 9.3 mg/dL (ref 8.9–10.3)
Chloride: 104 mmol/L (ref 98–111)
Creatinine: 0.99 mg/dL (ref 0.61–1.24)
GFR, Estimated: >60 mL/min (ref >60)
Glucose, Bld: 95 mg/dL (ref 70–99)
Potassium: 4.3 mmol/L (ref 3.5–5.1)
Sodium: 141 mmol/L (ref 135–145)
Total Bilirubin: 0.7 mg/dL (ref 0.3–1.2)
Total Protein: 6.5 g/dL (ref 6.5–8.1)

## 2021-03-30 LAB — PLATELET BY CITRATE

## 2021-03-30 LAB — SAVE SMEAR(SSMR), FOR PROVIDER SLIDE REVIEW

## 2021-03-30 NOTE — Progress Notes (Signed)
[facility] [phone]   [phone]  INITIAL CONSULT NOTE  Patient Care Team: Tally Joe, MD as PCP - General (Family Medicine) Levert Feinstein, MD as Consulting Physician (Hematology and Oncology)  Hematological/Oncological History 1) Patient was managed by Dr. Cyndie Chime and more recently Dr. 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  -Plts between 40-60k dating back to 2013 -2009 - 2017: treated with thrombopoietin agonist (Promacta or N-plate) by Dr. Cyndie Chime  -10/2016:  ? 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 Promacta discontinued in late 10/2016 after discussion with Dr. Yetta Numbers in [place]   2)Labs from PCP, Marita Snellen PA-C -02/07/2019: WBC 3.7, Hgb 14.0, MCV 91.8, Plt 68. -02/27/2020: WBC 2.2, Hgb 13.2, MCV 91.1, Plt 53 -03/23/2021: WBC 2.4 (L), Hgb 9.8 (L), MCV 96.4 (H), Plt count not reported due to clumping.   3) 03/30/2021: Re-establish care at [facility] with Georga Kaufmann PA-C  CHIEF COMPLAINTS/PURPOSE OF CONSULTATION:  "Chronic thrombocytopenia and leukopenia, now with anemia"  HISTORY OF PRESENTING ILLNESS:  Billy [name] 80 y.o. male with medical history significant for chronic thrombocytopenia and leukopenia presents to the clinic to re-establish care. Patient is unaccompanied for this visit.   On exam today, Billy [name] reports that he continues to recover from recent COVID infection on 02/20/2021. His stamina and energy levels decreased from the infection and slowly improving. He is able to complete his basic  ADLs but struggles to do chores such as mowing the lawn. He denies any change to his appetite or weight. He denies any nausea, vomiting or abdominal pain. He has regular bowel movements. Patient denies any signs of easy bruising or bleeding. This includes hematochezia, melena, hematuria, epistaxis or gum bleeding. Patient denies any fevers, chills, night sweats, chest pain, shortness of breath or cough. He has no other complaints. Rest of the 10 point ROS is below.   MEDICAL HISTORY:  Past Medical History:  Diagnosis Date  . Abscess    on chest  . 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 Clotting disorder (HCC)   . Hypertension   . Leukopenia 10/17/2012    SURGICAL HISTORY: Past Surgical History:  Procedure Laterality Date  . APPENDECTOMY  1955  . CYSTECTOMY  2000   left chest wall  . Deep excision of left anterior chest wall mass.  2005   ? infected seb cyst    SOCIAL HISTORY: Social History   Socioeconomic History  . Marital status: Married    Spouse name: Not on file  . Number of children: Not on file  . Years of education: Not on file  . Highest education level: Not on file  Occupational History  . Occupation: Land    Comment: semi-retired 2013  Tobacco Use  . Smoking status: Never Smoker  . Smokeless tobacco: Never Used  Substance and Sexual Activity  . Alcohol use: Yes    Alcohol/week: 0.0 standard drinks  . Drug use: No  . Sexual activity: Not on file  Other Topics Concern  . Not on file  Social History Narrative  . Not on file   Social Determinants of Health   Financial Resource  Strain: Not on file  Food Insecurity: Not on file  Transportation Needs: Not on file  Physical Activity: Not on file  Stress: Not on file  Social Connections: Not on file  Intimate Partner Violence: Not on file    FAMILY HISTORY: Family History  Problem Relation Age of Onset  . Heart disease Father    . Cancer Brother        bone and liver    ALLERGIES:  is allergic to penicillins.  MEDICATIONS:  Current Outpatient Medications  Medication Sig Dispense Refill  . amLODipine (NORVASC) 5 MG tablet     . losartan-hydrochlorothiazide (HYZAAR) 50-12.5 MG tablet Take 1 tablet by mouth daily.    . metoprolol succinate (TOPROL-XL) 100 MG 24 hr tablet Take 100 mg by mouth daily.      No current facility-administered medications for this visit.    REVIEW OF SYSTEMS:   Constitutional: ( - ) fevers, ( - )  chills , ( - ) night sweats Eyes: ( - ) blurriness of vision, ( - ) double vision, ( - ) watery eyes Ears, nose, mouth, throat, and face: ( - ) mucositis, ( - ) sore throat Respiratory: ( - ) cough, ( - ) dyspnea, ( - ) wheezes Cardiovascular: ( - ) palpitation, ( - ) chest discomfort, ( - ) lower extremity swelling Gastrointestinal:  ( - ) nausea, ( - ) heartburn, ( - ) change in bowel habits Skin: ( - ) abnormal skin rashes Lymphatics: ( - ) new lymphadenopathy, ( - ) easy bruising Neurological: ( - ) numbness, ( - ) tingling, ( - ) new weaknesses Behavioral/Psych: ( - ) mood change, ( - ) new changes  All other systems were reviewed with the patient and are negative.  PHYSICAL EXAMINATION: ECOG PERFORMANCE STATUS: 1 - Symptomatic but completely ambulatory  Vitals:   03/30/21 1442  BP: (!) 104/54  Pulse: 63  Resp: 16  Temp: 97.8 F (36.6 C)  SpO2: 98%   Filed Weights   03/30/21 1442  Weight: 198 lb 8 oz (90 kg)    GENERAL: well appearing male in NAD  SKIN: skin color, texture, turgor are normal, no rashes or significant lesions EYES: conjunctiva are pink and non-injected, sclera clear OROPHARYNX: no exudate, no erythema; lips, buccal mucosa, and tongue normal  NECK: supple, non-tender LYMPH:  no palpable lymphadenopathy in the cervical, axillary or supraclavicular lymph nodes.  LUNGS: clear to auscultation and percussion with normal breathing effort HEART: regular rate  & rhythm and no murmurs and no lower extremity edema ABDOMEN: soft, non-tender, non-distended, normal bowel sounds Musculoskeletal: no cyanosis of digits and no clubbing  PSYCH: alert & oriented x 3, fluent speech NEURO: no focal motor/sensory deficits  LABORATORY DATA:  I have reviewed the data as listed CBC Latest Ref Rng & Units 05/23/2019 10/14/2017 05/24/2017  WBC 4.0 - 10.5 K/uL 2.4(L) 3.0(L) 3.9(L)  Hemoglobin 13.0 - 17.0 g/dL 13.4 14.2 14.2  Hematocrit 39.0 - 52.0 % 40.8 43.0 43.4  Platelets 150 - 400 K/uL 60(L) 63(LL) 53(L)    CMP Latest Ref Rng & Units 05/23/2019 10/14/2017 11/09/2016  Glucose 70 - 99 mg/dL 98 87 105(H)  BUN 8 - 23 mg/dL _0 Creatinine 0.61 - 1.24 mg/dL 1.15 1.00 0.85  Sodium 135 - 145 mmol/L 141 143 144  Potassium 3.5 - 5.1 mmol/L 4.1 3.8 4.3  Chloride 98 - 111 mmol/L 104 100 104  CO2 22 - 32 mmol/L 30 28 26  Calcium 8.9 - 10.3 mg/dL 9.0 9.3 9.0  Total Protein 6.5 - 8.1 g/dL 6.5 6.6 6.1  Total Bilirubin 0.3 - 1.2 mg/dL 0.7 0.5 0.3  Alkaline Phos 38 - 126 U/L 45 47 49  AST 15 - 41 U/L _0 ALT 0 - 44 U/L _1 PATHOLOGY: Bone marrow biopsy on 11/04/2016:   Cytogenetic Analysis on 11/04/2016 was normal with no observable clonal chromosomal abnormalities.  ASSESSMENT & PLAN Billy [name] is a 80 y.o. male presenting to the clinic for history of chronic thrombocytopenia and leukopenia, now presenting with anemia as well. 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 Due to recent presentation of anemia, we recommend a repeat bone marrow biopsy to evaluate for progression to myelofibrosis. Patient requested to delay bone marrow biopsy for a few weeks as he is still recovering from COVID infection from March 2022. The plan will be to obtain labs to check CBC, CMP, platelet by citrate, save smear, iron and TIBC, retic panel, ferritin, B12 and folate levels.   #Pancytopenia --Due to recent presentation of anemia, concerned  about progression to myelofibrosis.  --Mutational analysis from 11/22/2016 confirmed MPL mutation that is present in 5% of patients with primary myelofibrosis.  --Recommend repeat bone marrow biopsy that patient has requested to delay for 1-2 months as he recovers from [place] today to check CBC, CMP, platelet by citrate, save smear, iron and TIBC, retic panel, ferritin, B12 and folate levels.  --RTC in 4 weeks with labs  Orders Placed This Encounter  Procedures  . CBC with Differential (Cancer Center Only)    Standing Status:   Future    Number of Occurrences:   1    Standing Expiration Date:   03/30/2022  . Platelet by Citrate    Standing Status:   Future    Number of Occurrences:   1    Standing Expiration Date:   03/30/2022  . CMP ([facility] only)    Standing Status:   Future    Number of Occurrences:   1    Standing Expiration Date:   03/30/2022  . Iron and TIBC    Standing Status:   Future    Number of Occurrences:   1    Standing Expiration Date:   03/30/2022  . Ferritin    Standing Status:   Future    Number of Occurrences:   1    Standing Expiration Date:   03/30/2022  . Retic Panel    Standing Status:   Future    Number of Occurrences:   1    Standing Expiration Date:   03/30/2022  . Vitamin B12    Standing Status:   Future    Number of Occurrences:   1    Standing Expiration Date:   03/30/2022  . Folate, Serum    Standing Status:   Future    Number of Occurrences:   1    Standing Expiration Date:   03/30/2022  . Save Smear (SS[facility])    Standing Status:   Future    Number of Occurrences:   1    Standing Expiration Date:   03/30/2022    All questions were answered. The patient knows to call the clinic with any problems, questions or concerns.  A total of more than 60 minutes were spent on this encounter and over half of that time was spent on counseling and coordination of care as outlined above.  Lincoln Brigham, PA-C Department of Hematology/Oncology [place] at [facility] Phone: [phone]  Patient was seen with Dr. Lorenso Courier.   I have read the above note and personally examined the patient. I agree with the assessment and plan as noted above.  Briefly Billy [name] is a 80 year old male with medical history significant for an MPL mutation and myelofibrosis.  Unfortunately over the last several months he has had a rapid decline in his hemoglobin concerning for progressive disease.  He has chronic leukopenia and thrombocytopenia which has not worsened.  Given the changes in this patient's hemoglobin we would recommend a bone marrow biopsy to evaluate further.  Unfortunately he does not wish to have this performed at this time but is amenable to repeat labs in approximately 4 weeks.  I would prefer to perform the bone marrow biopsy as soon as is feasible, however given that there are no treatments that would alter the course of disease we can continue to hold off and see if his drop in counts is truly due to a Covid infection.   Ledell Peoples, MD Department of Hematology/Oncology [place] at [facility] Phone: [phone] Pager: [phone] Email: [email or web address]

## 2021-03-31 LAB — IRON AND TIBC
Iron: 97 ug/dL (ref 42–163)
Saturation Ratios: 34 % (ref 20–55)
TIBC: 286 ug/dL (ref 202–409)
UIBC: 189 ug/dL (ref 117–376)

## 2021-03-31 LAB — FERRITIN: Ferritin: 214 ng/mL (ref 24–336)

## 2021-04-01 ENCOUNTER — Telehealth: Payer: Self-pay | Admitting: Physician Assistant

## 2021-04-01 NOTE — Telephone Encounter (Signed)
I called [name] to review the lab results from 03/30/2021.  Results indicate pancytopenia with WBC 2.4, hemoglobin 9.7 and platelet 75K.  Iron, B12 and folate levels were normal.  Results are concerning for myelofibrosis and repeat bone marrow biopsy is recommended to confirm this.  Patient would like to delay bone marrow biopsy at this time as he continues to recover from recent Covid infection.  He would like to keep the current plan to return in 4 weeks with repeat labs.  Patient is aware to call the clinic if he changes his mind about the bone marrow biopsy.  Patient expressed understanding and satisfaction with plan provided.

## 2021-04-01 NOTE — Telephone Encounter (Signed)
Scheduled per los. Called and left msg. Mailed printout  °

## 2021-04-24 ENCOUNTER — Other Ambulatory Visit: Payer: Self-pay | Admitting: Physician Assistant

## 2021-04-24 DIAGNOSIS — D61818 Other pancytopenia: Secondary | ICD-10-CM

## 2021-04-27 ENCOUNTER — Inpatient Hospital Stay: Payer: Medicare HMO

## 2021-04-27 ENCOUNTER — Other Ambulatory Visit: Payer: Self-pay

## 2021-04-27 ENCOUNTER — Inpatient Hospital Stay: Payer: Medicare HMO | Attending: Physician Assistant | Admitting: Physician Assistant

## 2021-04-27 VITALS — BP 129/64 | HR 64 | Temp 97.4°F | Resp 18 | Ht 71.0 in | Wt 197.7 lb

## 2021-04-27 DIAGNOSIS — D61818 Other pancytopenia: Secondary | ICD-10-CM

## 2021-04-27 DIAGNOSIS — Z8616 Personal history of COVID-19: Secondary | ICD-10-CM | POA: Insufficient documentation

## 2021-04-27 LAB — CBC WITH DIFFERENTIAL (CANCER CENTER ONLY)
Abs Immature Granulocytes: 0.34 10*3/uL — ABNORMAL HIGH (ref 0.00–0.07)
Basophils Absolute: 0 10*3/uL (ref 0.0–0.1)
Basophils Relative: 1 %
Eosinophils Absolute: 0 10*3/uL (ref 0.0–0.5)
Eosinophils Relative: 0 %
HCT: 32.1 % — ABNORMAL LOW (ref 39.0–52.0)
Hemoglobin: 10.3 g/dL — ABNORMAL LOW (ref 13.0–17.0)
Immature Granulocytes: 11 %
Lymphocytes Relative: 24 %
Lymphs Abs: 0.7 10*3/uL (ref 0.7–4.0)
MCH: 30.2 pg (ref 26.0–34.0)
MCHC: 32.1 g/dL (ref 30.0–36.0)
MCV: 94.1 fL (ref 80.0–100.0)
Monocytes Absolute: 0.8 10*3/uL (ref 0.1–1.0)
Monocytes Relative: 28 %
Neutro Abs: 1.1 10*3/uL — ABNORMAL LOW (ref 1.7–7.7)
Neutrophils Relative %: 36 %
Platelet Count: 79 10*3/uL — ABNORMAL LOW (ref 150–400)
RBC: 3.41 MIL/uL — ABNORMAL LOW (ref 4.22–5.81)
RDW: 17 % — ABNORMAL HIGH (ref 11.5–15.5)
WBC Count: 3 10*3/uL — ABNORMAL LOW (ref 4.0–10.5)
nRBC: 2.3 % — ABNORMAL HIGH (ref 0.0–0.2)

## 2021-04-27 NOTE — Progress Notes (Signed)
[place] [phone]   Fax:([phone]  PROGRESS NOTE  Patient Care Team: Antony Contras, MD as PCP - General (Family Medicine) Annia Belt, MD as Consulting Physician (Hematology and Oncology)  Hematological/Oncological History 1) Patient was managed by Dr. Beryle Beams and more recently Dr. 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  -Plts between 40-60k dating back to 2013 -2009 - 2017: treated with thrombopoietin agonist (Promacta or N-plate) by Dr. Beryle Beams  -10/2016:  ? 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 Promacta discontinued in late 10/2016 after discussion with Dr. Tamela Oddi in [place]   2)Labs from PCP, Jonita Albee PA-C -02/07/2019: WBC 3.7, Hgb 14.0, MCV 91.8, Plt 68. -02/27/2020: WBC 2.2, Hgb 13.2, MCV 91.1, Plt 53 -03/23/2021: WBC 2.4 (L), Hgb 9.8 (L), MCV 96.4 (H), Plt count not reported due to clumping.   3) 03/30/2021: Re-establish care at [facility] with Dede Query PA-C  CHIEF COMPLAINTS/PURPOSE OF CONSULTATION:  "Pancytopenia"  HISTORY OF PRESENTING ILLNESS:  Billy [name] 80 y.o. male with medical history significant for chronic thrombocytopenia and leukopenia presents to the clinic for a follow up visit. He is unaccompanied for this visit. His last visit was on 03/30/2021 and in the interim, patient denies any changes to his health.   On exam today, Billy [name] reports that his energy levels continue to improve since getting COVID infection in March 2022. He is able to complete his ADLs on his own but does  require resting periodically. He has a good appetite without any changes to his weight. Patient denies any nausea, vomiting or abdominal pain. He has regular bowel movements without any constipation or diarrhea. Patient denies any signs of easy bruising or bleeding. Patient denies any fevers, chills, night sweats, chest pain, shortness of breath or cough. He has no other complaints. Rest of the 10 point ROS is below.   MEDICAL HISTORY:  Past Medical History:  Diagnosis Date  . Abscess    on chest  . 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 Clotting disorder ([place])   . Hypertension   . Leukopenia 10/17/2012    SURGICAL HISTORY: Past Surgical History:  Procedure Laterality Date  . APPENDECTOMY  1955  . CYSTECTOMY  2000   left chest wall  . Deep excision of left anterior chest wall mass.  2005   ? infected seb cyst    SOCIAL HISTORY: Social History   Socioeconomic History  . Marital status: Married    Spouse name: Not on file  . Number of children: Not on file  . Years of education: Not on file  . Highest education level: Not on file  Occupational History  . Occupation: Restaurant manager, fast food    Comment: semi-retired 2013  Tobacco Use  . Smoking status: Never Smoker  . Smokeless tobacco: Never Used  Substance and Sexual Activity  . Alcohol use: Yes    Alcohol/week: 0.0 standard drinks  . Drug use: No  . Sexual activity: Not on file  Other Topics Concern  . Not on file  Social History Narrative  . Not on file   Social Determinants of Health   Financial Resource Strain: Not  on file  Food Insecurity: Not on file  Transportation Needs: Not on file  Physical Activity: Not on file  Stress: Not on file  Social Connections: Not on file  Intimate Partner Violence: Not on file    FAMILY HISTORY: Family History  Problem Relation Age of Onset  . Heart disease Father   . Cancer Brother        bone and liver     ALLERGIES:  is allergic to penicillins.  MEDICATIONS:  Current Outpatient Medications  Medication Sig Dispense Refill  . amLODipine (NORVASC) 5 MG tablet     . losartan-hydrochlorothiazide (HYZAAR) 50-12.5 MG tablet Take 1 tablet by mouth daily.    . metoprolol succinate (TOPROL-XL) 100 MG 24 hr tablet Take 100 mg by mouth daily.      No current facility-administered medications for this visit.    REVIEW OF SYSTEMS:   Constitutional: ( - ) fevers, ( - )  chills , ( - ) night sweats Eyes: ( - ) blurriness of vision, ( - ) double vision, ( - ) watery eyes Ears, nose, mouth, throat, and face: ( - ) mucositis, ( - ) sore throat Respiratory: ( - ) cough, ( - ) dyspnea, ( - ) wheezes Cardiovascular: ( - ) palpitation, ( - ) chest discomfort, ( - ) lower extremity swelling Gastrointestinal:  ( - ) nausea, ( - ) heartburn, ( - ) change in bowel habits Skin: ( - ) abnormal skin rashes Lymphatics: ( - ) new lymphadenopathy, ( - ) easy bruising Neurological: ( - ) numbness, ( - ) tingling, ( - ) new weaknesses Behavioral/Psych: ( - ) mood change, ( - ) new changes  All other systems were reviewed with the patient and are negative.  PHYSICAL EXAMINATION: ECOG PERFORMANCE STATUS: 1 - Symptomatic but completely ambulatory  Vitals:   04/27/21 1321  BP: 129/64  Pulse: 64  Resp: 18  Temp: (!) 97.4 F (36.3 C)  SpO2: 98%   Filed Weights   04/27/21 1321  Weight: 197 lb 11.2 oz (89.7 kg)    GENERAL: well appearing male in NAD  SKIN: skin color, texture, turgor are normal, no rashes or significant lesions EYES: conjunctiva are pink and non-injected, sclera clear OROPHARYNX: no exudate, no erythema; lips, buccal mucosa, and tongue normal  NECK: supple, non-tender LYMPH:  no palpable lymphadenopathy in the cervical, axillary or supraclavicular lymph nodes.  LUNGS: clear to auscultation and percussion with normal breathing effort HEART: regular rate & rhythm and no murmurs and no lower  extremity edema ABDOMEN: soft, non-tender, non-distended, normal bowel sounds Musculoskeletal: no cyanosis of digits and no clubbing  PSYCH: alert & oriented x 3, fluent speech NEURO: no focal motor/sensory deficits  LABORATORY DATA:  I have reviewed the data as listed CBC Latest Ref Rng & Units 04/27/2021 03/30/2021 05/23/2019  WBC 4.0 - 10.5 K/uL 3.0(L) 2.4(L) 2.4(L)  Hemoglobin 13.0 - 17.0 g/dL 10.3(L) 9.7(L) 13.4  Hematocrit 39.0 - 52.0 % 32.1(L) 30.7(L) 40.8  Platelets 150 - 400 K/uL 79(L) 75(L) 60(L)    CMP Latest Ref Rng & Units 03/30/2021 05/23/2019 10/14/2017  Glucose 70 - 99 mg/dL 95 98 87  BUN 8 - 23 mg/dL $Remove'15 14 12  'IqTcdqJ$ Creatinine 0.61 - 1.24 mg/dL 0.99 1.15 1.00  Sodium 135 - 145 mmol/L 141 141 143  Potassium 3.5 - 5.1 mmol/L 4.3 4.1 3.8  Chloride 98 - 111 mmol/L 104 104 100  CO2 22 - 32 mmol/L $RemoveB'27 30 28  'TVUWjMEC$ Calcium  8.9 - 10.3 mg/dL 9.3 9.0 9.3  Total Protein 6.5 - 8.1 g/dL 6.5 6.5 6.6  Total Bilirubin 0.3 - 1.2 mg/dL 0.7 0.7 0.5  Alkaline Phos 38 - 126 U/L 46 45 47  AST 15 - 41 U/L $Remo'22 23 22  'hnBsN$ ALT 0 - 44 U/L $Remo'15 23 29     'IszGS$ PATHOLOGY: Bone marrow biopsy on 11/04/2016:   Cytogenetic Analysis on 11/04/2016 was normal with no observable clonal chromosomal abnormalities.  ASSESSMENT & PLAN Billy [name] is a 80 y.o. male presenting to the clinic for pancytopenia with MPL mutation.  Labs from today were reviewed that shows mild improvement of WBC 3.0 and hemoglobin 10.3. There is stable thrombocytopenia with platelet count of 79K. Due to persistent anemia, the recommendation is bone marrow biopsy to evaluate for progression to myelofibrosis. Patient would like to closely monitor at this time since blood counts have not declined in the last 4 weeks. I think this is reasonable and so patient will return to the clinic in 3 months with repeat labs. We will consider proceeding with bone marrow biopsy if counts worsen in the future.    #Pancytopenia --Due to recent presentation of anemia,  concerned about progression to myelofibrosis.  --Mutational analysis from 11/22/2016 confirmed MPL mutation that is present in 5% of patients with primary myelofibrosis.  --Labs today show overall stable to mild improvement with WBC at 3.0, Hgb 10.3 and Plt 79K.  --Recommend repeat bone marrow biopsy but patient would like to closely monitor at this time.  --RTC in 3 months with labs  # History of COVID-19 --Patient is scheduled for his 4th booster on Wednesday 04/29/2021.  --I will refer patient to Evusheld clinic.   Orders Placed This Encounter  Procedures  . CBC with Differential (Cancer Center Only)    Standing Status:   Future    Standing Expiration Date:   04/27/2022  . Ambulatory Referral for Evusheld    Referral Priority:   Routine    Referral Type:   Consultation    Referral Reason:   Specialty Services Required    Number of Visits Requested:   1    All questions were answered. The patient knows to call the clinic with any problems, questions or concerns.  A total of more than 25 minutes were spent on this encounter and over half of that time was spent on counseling and coordination of care as outlined above.    Lincoln Brigham, PA-C Department of Hematology/Oncology [place] at [facility] Phone: [phone]

## 2021-04-29 ENCOUNTER — Other Ambulatory Visit: Payer: Self-pay

## 2021-04-29 ENCOUNTER — Other Ambulatory Visit (HOSPITAL_BASED_OUTPATIENT_CLINIC_OR_DEPARTMENT_OTHER): Payer: Self-pay

## 2021-04-29 ENCOUNTER — Ambulatory Visit: Payer: Medicare HMO | Attending: Internal Medicine

## 2021-04-29 DIAGNOSIS — Z23 Encounter for immunization: Secondary | ICD-10-CM

## 2021-04-29 MED ORDER — PFIZER-BIONT COVID-19 VAC-TRIS 30 MCG/0.3ML IM SUSP
INTRAMUSCULAR | 0 refills | Status: DC
  Filled 2021-04-29: qty 0.3, 1d supply, fill #0

## 2021-04-29 NOTE — Progress Notes (Signed)
   Covid-19 Vaccination Clinic  Name:  DAVIN ARCHULETTA    MRN: [id number] DOB: 1941/02/12  04/29/2021  Mr. Cooks was observed post Covid-19 immunization for 15 minutes without incident. He was provided with Vaccine Information Sheet and instruction to access the V-Safe system.   Mr. Norem was instructed to call 911 with any severe reactions post vaccine: Marland Kitchen Difficulty breathing  . Swelling of face and throat  . A fast heartbeat  . A bad rash all over body  . Dizziness and weakness   Immunizations Administered    Name Date Dose VIS Date Route   PFIZER Comrnaty(Gray TOP) Covid-19 Vaccine 04/29/2021 11:19 AM 0.3 mL 11/27/2020 Intramuscular   Manufacturer: [employer], [employer]   Lot: [id number]   NDC: [phone]

## 2021-05-04 ENCOUNTER — Telehealth: Payer: Self-pay | Admitting: Adult Health

## 2021-05-04 NOTE — Telephone Encounter (Signed)
I called patient to discuss Evusheld, a long acting monoclonal antibody injection administered to patients with decreased immune systems or intolerance/allergy to the COVID 19 vaccine as COVID19 prevention.    Unable to reach patient.  LMOM to return my call.  Chao Blazejewski, NP  

## 2021-07-27 ENCOUNTER — Other Ambulatory Visit: Payer: Self-pay | Admitting: Physician Assistant

## 2021-07-28 ENCOUNTER — Other Ambulatory Visit: Payer: Self-pay

## 2021-07-28 ENCOUNTER — Inpatient Hospital Stay: Payer: Medicare HMO | Attending: Physician Assistant

## 2021-07-28 ENCOUNTER — Inpatient Hospital Stay: Payer: Medicare HMO | Admitting: Physician Assistant

## 2021-07-28 VITALS — BP 127/62 | HR 71 | Temp 98.1°F | Resp 15 | Ht 71.0 in | Wt 196.3 lb

## 2021-07-28 DIAGNOSIS — D61818 Other pancytopenia: Secondary | ICD-10-CM

## 2021-07-28 LAB — CBC WITH DIFFERENTIAL (CANCER CENTER ONLY)
Abs Immature Granulocytes: 0.31 10*3/uL — ABNORMAL HIGH (ref 0.00–0.07)
Basophils Absolute: 0 10*3/uL (ref 0.0–0.1)
Basophils Relative: 1 %
Eosinophils Absolute: 0 10*3/uL (ref 0.0–0.5)
Eosinophils Relative: 1 %
HCT: 31.2 % — ABNORMAL LOW (ref 39.0–52.0)
Hemoglobin: 9.9 g/dL — ABNORMAL LOW (ref 13.0–17.0)
Immature Granulocytes: 12 %
Lymphocytes Relative: 23 %
Lymphs Abs: 0.6 10*3/uL — ABNORMAL LOW (ref 0.7–4.0)
MCH: 30.5 pg (ref 26.0–34.0)
MCHC: 31.7 g/dL (ref 30.0–36.0)
MCV: 96 fL (ref 80.0–100.0)
Monocytes Absolute: 0.5 10*3/uL (ref 0.1–1.0)
Monocytes Relative: 19 %
Neutro Abs: 1.2 10*3/uL — ABNORMAL LOW (ref 1.7–7.7)
Neutrophils Relative %: 44 %
Platelet Count: 70 10*3/uL — ABNORMAL LOW (ref 150–400)
RBC: 3.25 MIL/uL — ABNORMAL LOW (ref 4.22–5.81)
RDW: 17.8 % — ABNORMAL HIGH (ref 11.5–15.5)
WBC Count: 2.6 10*3/uL — ABNORMAL LOW (ref 4.0–10.5)
nRBC: 1.9 % — ABNORMAL HIGH (ref 0.0–0.2)

## 2021-07-28 NOTE — Progress Notes (Signed)
[place] [phone]   Fax:(336) [phone]  PROGRESS NOTE  Patient Care Team: Antony Contras, MD as PCP - General (Family Medicine)  Hematological/Oncological History 1) Patient was managed by Dr. Beryle Beams and more recently Dr. 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  -Plts between 40-60k dating back to 2013 -2009 - 2017: treated with thrombopoietin agonist (Promacta or N-plate) by Dr. 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 Tamela Oddi in [place]   2)Labs from PCP, Jonita Albee PA-C -02/07/2019: WBC 3.7, Hgb 14.0, MCV 91.8, Plt 68. -02/27/2020: WBC 2.2, Hgb 13.2, MCV 91.1, Plt 53 -03/23/2021: WBC 2.4 (L), Hgb 9.8 (L), MCV 96.4 (H), Plt count not reported due to clumping.   3) 03/30/2021: Re-establish care at [facility] with Dede Query PA-C  CHIEF COMPLAINTS/PURPOSE OF CONSULTATION:  "Pancytopenia"  HISTORY OF PRESENTING ILLNESS:  Billy [name] 80 y.o. male returns for a follow up visit for pancytopenia. He is unaccompanied for this visit. His last visit was on 04/27/2021 and in the interim, patient denies any changes to his health.   On exam today, Billy [name] reports stable energy levels and he is able to complete his ADLs on his own.  He does note that he is unable to do certain activities including lawn care.  He has a good appetite without any changes to his weight. Patient denies any nausea, vomiting or abdominal pain. He has regular bowel movements without  any constipation or diarrhea. Patient denies any signs of easy bruising or bleeding. Patient denies any fevers, chills, night sweats, chest pain, shortness of breath or cough. He has no other complaints. Rest of the 10 point ROS is below.   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  2005   ? infected seb cyst    SOCIAL HISTORY: Social History   Socioeconomic History   Marital status: Married    Spouse name: Not on file   Number of children: Not on file   Years of education: Not on file   Highest education level: Not on file  Occupational History   Occupation: Chiropractor    Comment: semi-retired 2013  Tobacco Use   Smoking status: Never   Smokeless tobacco: Never  Substance and Sexual Activity   Alcohol use: Yes    Alcohol/week: 0.0 standard drinks   Drug use: No   Sexual activity: Not on file  Other Topics Concern   Not on file  Social History Narrative   Not on file   Social Determinants of Health   Financial Resource Strain: Not on file  Food Insecurity: Not on file  Transportation Needs: Not on file  Physical Activity: Not on file  Stress: Not on  file  Social Connections: Not on file  Intimate Partner Violence: Not on file    FAMILY HISTORY: Family History  Problem Relation Age of Onset   Heart disease Father    Cancer Brother        bone and liver    ALLERGIES:  is allergic to penicillins.  MEDICATIONS:  Current Outpatient Medications  Medication Sig Dispense Refill   amLODipine (NORVASC) 5 MG tablet      COVID-19 mRNA Vac-TriS, Pfizer, (PFIZER-BIONT COVID-19  VAC-TRIS) SUSP injection Inject into the muscle. 0.3 mL 0   losartan-hydrochlorothiazide (HYZAAR) 50-12.5 MG tablet Take 1 tablet by mouth daily.     metoprolol succinate (TOPROL-XL) 100 MG 24 hr tablet Take 100 mg by mouth daily.      No current facility-administered medications for this visit.    REVIEW OF SYSTEMS:   Constitutional: ( - ) fevers, ( - )  chills , ( - ) night sweats Eyes: ( - ) blurriness of vision, ( - ) double vision, ( - ) watery eyes Ears, nose, mouth, throat, and face: ( - ) mucositis, ( - ) sore throat Respiratory: ( - ) cough, ( - ) dyspnea, ( - ) wheezes Cardiovascular: ( - ) palpitation, ( - ) chest discomfort, ( - ) lower extremity swelling Gastrointestinal:  ( - ) nausea, ( - ) heartburn, ( - ) change in bowel habits Skin: ( - ) abnormal skin rashes Lymphatics: ( - ) new lymphadenopathy, ( - ) easy bruising Neurological: ( - ) numbness, ( - ) tingling, ( - ) new weaknesses Behavioral/Psych: ( - ) mood change, ( - ) new changes  All other systems were reviewed with the patient and are negative.  PHYSICAL EXAMINATION: ECOG PERFORMANCE STATUS: 1 - Symptomatic but completely ambulatory  Vitals:   07/28/21 1032  BP: 127/62  Pulse: 71  Resp: 15  Temp: 98.1 F (36.7 C)  SpO2: 98%   Filed Weights   07/28/21 1032  Weight: 196 lb 4.8 oz (89 kg)    GENERAL: well appearing male in NAD  SKIN: skin color, texture, turgor are normal, no rashes or significant lesions EYES: conjunctiva are pink and non-injected, sclera clear OROPHARYNX: no exudate, no erythema; lips, buccal mucosa, and tongue normal  LYMPH:  no palpable lymphadenopathy in the cervical or supraclavicular lymph nodes.  LUNGS: clear to auscultation and percussion with normal breathing effort HEART: regular rate & rhythm and no murmurs and no lower extremity edema ABDOMEN: soft, non-tender, non-distended, normal bowel sounds Musculoskeletal: no cyanosis of digits and no clubbing  PSYCH: alert &  oriented x 3, fluent speech NEURO: no focal motor/sensory deficits  LABORATORY DATA:  I have reviewed the data as listed CBC Latest Ref Rng & Units 07/28/2021 04/27/2021 03/30/2021  WBC 4.0 - 10.5 K/uL 2.6(L) 3.0(L) 2.4(L)  Hemoglobin 13.0 - 17.0 g/dL 9.9(L) 10.3(L) 9.7(L)  Hematocrit 39.0 - 52.0 % 31.2(L) 32.1(L) 30.7(L)  Platelets 150 - 400 K/uL 70(L) 79(L) 75(L)    CMP Latest Ref Rng & Units 03/30/2021 05/23/2019 10/14/2017  Glucose 70 - 99 mg/dL 95 98 87  BUN 8 - 23 mg/dL $Remove'15 14 12  'ManMHEg$ Creatinine 0.61 - 1.24 mg/dL 0.99 1.15 1.00  Sodium 135 - 145 mmol/L 141 141 143  Potassium 3.5 - 5.1 mmol/L 4.3 4.1 3.8  Chloride 98 - 111 mmol/L 104 104 100  CO2 22 - 32 mmol/L $RemoveB'27 30 28  'khemUHNm$ Calcium 8.9 - 10.3 mg/dL 9.3 9.0 9.3  Total Protein 6.5 -  8.1 g/dL 6.5 6.5 6.6  Total Bilirubin 0.3 - 1.2 mg/dL 0.7 0.7 0.5  Alkaline Phos 38 - 126 U/L 46 45 47  AST 15 - 41 U/L $Remo'22 23 22  'wLtRn$ ALT 0 - 44 U/L $Remo'15 23 29     'itgSC$ PATHOLOGY: Bone marrow biopsy on 11/04/2016:   Cytogenetic Analysis on 11/04/2016 was normal with no observable clonal chromosomal abnormalities.  ASSESSMENT & PLAN Billy [name] is a 80 y.o. male presenting to the clinic for pancytopenia with MPL mutation.    #Pancytopenia --Mutational analysis from 11/22/2016 confirmed MPL mutation that is present in 5% of patients with primary myelofibrosis.  --Labs today show overall stable with WBC at 2.6, Hgb 9.9 and Plt 70.  -- Discussed importance of bone marrow biopsy to evaluate for myelofibrosis. Patient would like to closely monitor at this time.  --RTC in 4 months with labs  Orders Placed This Encounter  Procedures   CBC with Differential ([place] Only)    Standing Status:   Future    Standing Expiration Date:   07/28/2022     All questions were answered. The patient knows to call the clinic with any problems, questions or concerns.  I have spent a total of 25 minutes minutes of face-to-face and non-face-to-face time, preparing to see the  patient, performing a medically appropriate examination, counseling and educating the patient, ordering medications/tests/procedures, documenting clinical information in the electronic health record, and care coordination.   Lincoln Brigham, PA-C Department of Hematology/Oncology [place] at [facility] Phone: [phone]

## 2021-08-20 DIAGNOSIS — E782 Mixed hyperlipidemia: Secondary | ICD-10-CM | POA: Diagnosis not present

## 2021-08-20 DIAGNOSIS — R413 Other amnesia: Secondary | ICD-10-CM | POA: Diagnosis not present

## 2021-08-20 DIAGNOSIS — R739 Hyperglycemia, unspecified: Secondary | ICD-10-CM | POA: Diagnosis not present

## 2021-08-20 DIAGNOSIS — Z1389 Encounter for screening for other disorder: Secondary | ICD-10-CM | POA: Diagnosis not present

## 2021-08-20 DIAGNOSIS — Z0000 Encounter for general adult medical examination without abnormal findings: Secondary | ICD-10-CM | POA: Diagnosis not present

## 2021-08-20 DIAGNOSIS — D61818 Other pancytopenia: Secondary | ICD-10-CM | POA: Diagnosis not present

## 2021-08-20 DIAGNOSIS — M7732 Calcaneal spur, left foot: Secondary | ICD-10-CM | POA: Diagnosis not present

## 2021-08-20 DIAGNOSIS — Z23 Encounter for immunization: Secondary | ICD-10-CM | POA: Diagnosis not present

## 2021-08-20 DIAGNOSIS — I10 Essential (primary) hypertension: Secondary | ICD-10-CM | POA: Diagnosis not present

## 2021-10-02 ENCOUNTER — Ambulatory Visit: Payer: Medicare HMO | Attending: Internal Medicine

## 2021-10-02 ENCOUNTER — Other Ambulatory Visit: Payer: Self-pay

## 2021-10-02 ENCOUNTER — Other Ambulatory Visit (HOSPITAL_BASED_OUTPATIENT_CLINIC_OR_DEPARTMENT_OTHER): Payer: Self-pay

## 2021-10-02 DIAGNOSIS — Z23 Encounter for immunization: Secondary | ICD-10-CM

## 2021-10-02 MED ORDER — PFIZER COVID-19 VAC BIVALENT 30 MCG/0.3ML IM SUSP
INTRAMUSCULAR | 0 refills | Status: DC
  Filled 2021-10-02: qty 0.5, 1d supply, fill #0

## 2021-10-02 NOTE — Progress Notes (Signed)
   Covid-19 Vaccination Clinic  Name:  MYLES TAVELLA    MRN: [id number] DOB: [birth year]  10/02/2021  Mr. Himebaugh was observed post Covid-19 immunization for 15 minutes without incident. He was provided with Vaccine Information Sheet and instruction to access the V-Safe system.   Mr. Czaja was instructed to call 911 with any severe reactions post vaccine: Difficulty breathing  Swelling of face and throat  A fast heartbeat  A bad rash all over body  Dizziness and weakness

## 2021-10-21 DIAGNOSIS — I10 Essential (primary) hypertension: Secondary | ICD-10-CM | POA: Diagnosis not present

## 2021-10-21 DIAGNOSIS — D61818 Other pancytopenia: Secondary | ICD-10-CM | POA: Diagnosis not present

## 2021-10-21 DIAGNOSIS — E782 Mixed hyperlipidemia: Secondary | ICD-10-CM | POA: Diagnosis not present

## 2021-10-28 DIAGNOSIS — B356 Tinea cruris: Secondary | ICD-10-CM | POA: Diagnosis not present

## 2021-11-24 ENCOUNTER — Other Ambulatory Visit: Payer: Self-pay | Admitting: Physician Assistant

## 2021-11-25 ENCOUNTER — Inpatient Hospital Stay: Payer: Medicare HMO

## 2021-11-25 ENCOUNTER — Other Ambulatory Visit: Payer: Self-pay

## 2021-11-25 ENCOUNTER — Inpatient Hospital Stay: Payer: Medicare HMO | Attending: Physician Assistant | Admitting: Physician Assistant

## 2021-11-25 VITALS — BP 135/54 | HR 70 | Temp 97.8°F | Resp 18 | Ht 71.0 in | Wt 197.3 lb

## 2021-11-25 DIAGNOSIS — D649 Anemia, unspecified: Secondary | ICD-10-CM | POA: Insufficient documentation

## 2021-11-25 DIAGNOSIS — D696 Thrombocytopenia, unspecified: Secondary | ICD-10-CM | POA: Diagnosis not present

## 2021-11-25 DIAGNOSIS — D61818 Other pancytopenia: Secondary | ICD-10-CM

## 2021-11-25 LAB — CBC WITH DIFFERENTIAL (CANCER CENTER ONLY)
Abs Immature Granulocytes: 0.33 10*3/uL — ABNORMAL HIGH (ref 0.00–0.07)
Basophils Absolute: 0 10*3/uL (ref 0.0–0.1)
Basophils Relative: 1 %
Eosinophils Absolute: 0 10*3/uL (ref 0.0–0.5)
Eosinophils Relative: 0 %
HCT: 28.4 % — ABNORMAL LOW (ref 39.0–52.0)
Hemoglobin: 8.8 g/dL — ABNORMAL LOW (ref 13.0–17.0)
Immature Granulocytes: 12 %
Lymphocytes Relative: 15 %
Lymphs Abs: 0.4 10*3/uL — ABNORMAL LOW (ref 0.7–4.0)
MCH: 30.2 pg (ref 26.0–34.0)
MCHC: 31 g/dL (ref 30.0–36.0)
MCV: 97.6 fL (ref 80.0–100.0)
Monocytes Absolute: 0.7 10*3/uL (ref 0.1–1.0)
Monocytes Relative: 25 %
Neutro Abs: 1.3 10*3/uL — ABNORMAL LOW (ref 1.7–7.7)
Neutrophils Relative %: 47 %
Platelet Count: 58 10*3/uL — ABNORMAL LOW (ref 150–400)
RBC: 2.91 MIL/uL — ABNORMAL LOW (ref 4.22–5.81)
RDW: 18.3 % — ABNORMAL HIGH (ref 11.5–15.5)
WBC Count: 2.8 10*3/uL — ABNORMAL LOW (ref 4.0–10.5)
nRBC: 2.5 % — ABNORMAL HIGH (ref 0.0–0.2)

## 2021-11-25 NOTE — Progress Notes (Signed)
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 -02/27/2020: WBC 2.2, Hgb 13.2, MCV 91.1, Plt 53 -03/23/2021: WBC 2.4 (L), Hgb 9.8 (L), MCV 96.4 (H), Plt count not reported due to clumping.   3) 03/30/2021: Re-establish care at [facility] with Billy Query PA-C  CHIEF COMPLAINTS/PURPOSE OF CONSULTATION:  "Pancytopenia"  HISTORY OF PRESENTING ILLNESS:  Billy [name] 80 y.o. male returns for a follow up visit for pancytopenia. He is unaccompanied for this visit. His last visit was on 07/28/2021 and in the interim, patient denies any changes to his health.   On exam today, Billy [name] reports that his energy levels are stable from the last visit.  He continues to complete all his daily activities on his own.  He denies any dietary changes or weight loss.  He denies any nausea, vomiting or abdominal pain.  His bowel habits are unchanged without any diarrhea or constipation.  He denies easy  bruising or signs of bleeding.  He denies any fevers, chills, night sweats, shortness of breath, chest pain or cough.  He has no other complaints.  Rest of the 10 point ROS is below.  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)    Hypertension    Leukopenia 10/17/2012    SURGICAL HISTORY: Past Surgical History:  Procedure Laterality Date   APPENDECTOMY  1955   CYSTECTOMY  2000   left chest wall   Deep excision of left anterior chest wall mass.  2005   ? infected seb cyst    SOCIAL HISTORY: Social History   Socioeconomic History   Marital status: Married    Spouse name: Not on file   Number of children: Not on file   Years of education: Not on file   Highest education level: Not on file  Occupational History   Occupation: Chiropractor    Comment: semi-retired 2013  Tobacco Use   Smoking status: Never   Smokeless tobacco: Never  Substance and Sexual Activity   Alcohol use: Yes    Alcohol/week: 0.0 standard drinks   Drug use: No   Sexual activity: Not on file  Other Topics Concern   Not on file  Social History Narrative   Not on file   Social Determinants of Health   Financial Resource Strain: Not on file  Food Insecurity: Not on file  Transportation Needs: Not on file  Physical Activity: Not on file  Stress: Not on file  Social Connections: Not on  file  Intimate Partner Violence: Not on file    FAMILY HISTORY: Family History  Problem Relation Age of Onset   Heart disease Father    Cancer Brother        bone and liver    ALLERGIES:  is allergic to penicillins.  MEDICATIONS:  Current Outpatient Medications  Medication Sig Dispense Refill   amLODipine (NORVASC) 5 MG tablet      losartan-hydrochlorothiazide (HYZAAR) 50-12.5 MG tablet Take 1 tablet by mouth daily.     metoprolol succinate  (TOPROL-XL) 100 MG 24 hr tablet Take 100 mg by mouth daily.      Multiple Vitamins-Minerals (MULTIVITAMIN MEN PO) Take by mouth daily.     COVID-19 mRNA bivalent vaccine, Pfizer, (PFIZER COVID-19 VAC BIVALENT) injection Inject into the muscle. 0.5 mL 0   COVID-19 mRNA Vac-TriS, Pfizer, (PFIZER-BIONT COVID-19 VAC-TRIS) SUSP injection Inject into the muscle. 0.3 mL 0   No current facility-administered medications for this visit.    REVIEW OF SYSTEMS:   Constitutional: ( - ) fevers, ( - )  chills , ( - ) night sweats Eyes: ( - ) blurriness of vision, ( - ) double vision, ( - ) watery eyes Ears, nose, mouth, throat, and face: ( - ) mucositis, ( - ) sore throat Respiratory: ( - ) cough, ( - ) dyspnea, ( - ) wheezes Cardiovascular: ( - ) palpitation, ( - ) chest discomfort, ( - ) lower extremity swelling Gastrointestinal:  ( - ) nausea, ( - ) heartburn, ( - ) change in bowel habits Skin: ( - ) abnormal skin rashes Lymphatics: ( - ) new lymphadenopathy, ( - ) easy bruising Neurological: ( - ) numbness, ( - ) tingling, ( - ) new weaknesses Behavioral/Psych: ( - ) mood change, ( - ) new changes  All other systems were reviewed with the patient and are negative.  PHYSICAL EXAMINATION: ECOG PERFORMANCE STATUS: 1 - Symptomatic but completely ambulatory  Vitals:   11/25/21 1030  BP: (!) 135/54  Pulse: 70  Resp: 18  Temp: 97.8 F (36.6 C)  SpO2: 98%   Filed Weights   11/25/21 1030  Weight: 197 lb 4.8 oz (89.5 kg)    GENERAL: well appearing male in NAD  SKIN: skin color, texture, turgor are normal, no rashes or significant lesions EYES: conjunctiva are pink and non-injected, sclera clear OROPHARYNX: no exudate, no erythema; lips, buccal mucosa, and tongue normal  LYMPH:  no palpable lymphadenopathy in the cervical or supraclavicular lymph nodes.  LUNGS: clear to auscultation and percussion with normal breathing effort HEART: regular rate & rhythm and no murmurs and no lower extremity  edema ABDOMEN: soft, non-tender, non-distended, normal bowel sounds Musculoskeletal: no cyanosis of digits and no clubbing  PSYCH: alert & oriented x 3, fluent speech NEURO: no focal motor/sensory deficits  LABORATORY DATA:  I have reviewed the data as listed CBC Latest Ref Rng & Units 11/25/2021 07/28/2021 04/27/2021  WBC 4.0 - 10.5 K/uL 2.8(L) 2.6(L) 3.0(L)  Hemoglobin 13.0 - 17.0 g/dL 8.8(L) 9.9(L) 10.3(L)  Hematocrit 39.0 - 52.0 % 28.4(L) 31.2(L) 32.1(L)  Platelets 150 - 400 K/uL 58(L) 70(L) 79(L)    CMP Latest Ref Rng & Units 03/30/2021 05/23/2019 10/14/2017  Glucose 70 - 99 mg/dL 95 98 87  BUN 8 - 23 mg/dL $Remove'15 14 12  'vxQoMjc$ Creatinine 0.61 - 1.24 mg/dL 0.99 1.15 1.00  Sodium 135 - 145 mmol/L 141 141 143  Potassium 3.5 - 5.1 mmol/L 4.3 4.1 3.8  Chloride 98 -  111 mmol/L 104 104 100  CO2 22 - 32 mmol/L $RemoveB'27 30 28  'loFmQYDv$ Calcium 8.9 - 10.3 mg/dL 9.3 9.0 9.3  Total Protein 6.5 - 8.1 g/dL 6.5 6.5 6.6  Total Bilirubin 0.3 - 1.2 mg/dL 0.7 0.7 0.5  Alkaline Phos 38 - 126 U/L 46 45 47  AST 15 - 41 U/L $Remo'22 23 22  'bTKXc$ ALT 0 - 44 U/L $Remo'15 23 29     'tQULV$ PATHOLOGY: Bone marrow biopsy on 11/04/2016:   Cytogenetic Analysis on 11/04/2016 was normal with no observable clonal chromosomal abnormalities.  ASSESSMENT & PLAN AYDEN HARDWICK is a 80 y.o. male presenting to the clinic for pancytopenia with MPL mutation.    #Pancytopenia --Mutational analysis from 11/22/2016 confirmed MPL mutation that is present in 5% of patients with primary myelofibrosis.  --Labs today show worsening anemia and thrombocytopenia. Hgb is 8.8, Plt 58K. WBC is stable at 2.8.   -- Discussed importance of bone marrow biopsy to further evaluate worsening counts. Differentials include myelofibrosis versus hematologic malignancy. Patient declined our recommendation and would like to continue to monitor. I recommended repeat CBC in 4 weeks to monitor closely but he has requested to come back in 8 weeks instead.  --I gave strict precautions for bleeding  and if patient becomes symptomatic with anemia including fatigue and shortness of breath.  --Recommend transfusion for hemoglobin less than 7 or platelets less than 20 --RTC in 8 weeks with labs  Orders Placed This Encounter  Procedures   CBC with Differential ([place] Only)    Standing Status:   Future    Standing Expiration Date:   11/25/2022   Sample to Blood Bank    Standing Status:   Future    Standing Expiration Date:   11/25/2022      All questions were answered. The patient knows to call the clinic with any problems, questions or concerns.  I have spent a total of 25 minutes minutes of face-to-face and non-face-to-face time, preparing to see the patient, performing a medically appropriate examination, counseling and educating the patient, ordering medications/tests/procedures, documenting clinical information in the electronic health record, and care coordination.   Lincoln Brigham, PA-C Department of Hematology/Oncology [place] at [facility] Phone: [phone]

## 2022-01-20 ENCOUNTER — Inpatient Hospital Stay: Payer: Medicare HMO | Attending: Physician Assistant

## 2022-01-20 ENCOUNTER — Inpatient Hospital Stay: Payer: Medicare HMO | Admitting: Physician Assistant

## 2022-01-20 ENCOUNTER — Other Ambulatory Visit: Payer: Self-pay

## 2022-01-20 VITALS — BP 112/54 | HR 73 | Temp 97.7°F | Resp 18 | Wt 195.2 lb

## 2022-01-20 DIAGNOSIS — Z8616 Personal history of COVID-19: Secondary | ICD-10-CM | POA: Diagnosis not present

## 2022-01-20 DIAGNOSIS — D61818 Other pancytopenia: Secondary | ICD-10-CM | POA: Insufficient documentation

## 2022-01-20 DIAGNOSIS — D7581 Myelofibrosis: Secondary | ICD-10-CM | POA: Diagnosis not present

## 2022-01-20 LAB — CBC WITH DIFFERENTIAL (CANCER CENTER ONLY)
Abs Immature Granulocytes: 0.34 10*3/uL — ABNORMAL HIGH (ref 0.00–0.07)
Basophils Absolute: 0 10*3/uL (ref 0.0–0.1)
Basophils Relative: 0 %
Eosinophils Absolute: 0 10*3/uL (ref 0.0–0.5)
Eosinophils Relative: 0 %
HCT: 28.6 % — ABNORMAL LOW (ref 39.0–52.0)
Hemoglobin: 8.8 g/dL — ABNORMAL LOW (ref 13.0–17.0)
Immature Granulocytes: 12 %
Lymphocytes Relative: 17 %
Lymphs Abs: 0.5 10*3/uL — ABNORMAL LOW (ref 0.7–4.0)
MCH: 30.3 pg (ref 26.0–34.0)
MCHC: 30.8 g/dL (ref 30.0–36.0)
MCV: 98.6 fL (ref 80.0–100.0)
Monocytes Absolute: 0.7 10*3/uL (ref 0.1–1.0)
Monocytes Relative: 23 %
Neutro Abs: 1.3 10*3/uL — ABNORMAL LOW (ref 1.7–7.7)
Neutrophils Relative %: 48 %
Platelet Count: 68 10*3/uL — ABNORMAL LOW (ref 150–400)
RBC: 2.9 MIL/uL — ABNORMAL LOW (ref 4.22–5.81)
RDW: 18.2 % — ABNORMAL HIGH (ref 11.5–15.5)
WBC Count: 2.8 10*3/uL — ABNORMAL LOW (ref 4.0–10.5)
nRBC: 2.5 % — ABNORMAL HIGH (ref 0.0–0.2)

## 2022-01-20 LAB — SAMPLE TO BLOOD BANK

## 2022-01-20 NOTE — Progress Notes (Signed)
[place] [phone]) [phone]   Fax:(336) [phone]  PROGRESS NOTE  Patient Care Team: Antony Contras, MD as PCP - General (Family Medicine)  Hematological/Oncological History 1) Patient was managed by Dr. Beryle Beams and more recently Dr. 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  -Plts between 40-60k dating back to 2013 -2009 - 2017: treated with thrombopoietin agonist (Promacta or N-plate) by Dr. 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 Tamela Oddi in [place]   2)Labs from PCP, Jonita Albee PA-C -02/07/2019: WBC 3.7, Hgb 14.0, MCV 91.8, Plt 68. -02/27/2020: WBC 2.2, Hgb 13.2, MCV 91.1, Plt 53 -03/23/2021: WBC 2.4 (L), Hgb 9.8 (L), MCV 96.4 (H), Plt count not reported due to clumping.   3) 03/30/2021: Re-establish care at [facility] with Dede Query PA-C  CHIEF COMPLAINTS/PURPOSE OF CONSULTATION:  "Pancytopenia"  HISTORY OF PRESENTING ILLNESS:  Billy [name] 81 y.o. male returns for a follow up visit for pancytopenia. He is unaccompanied for this visit. His last visit was on 11/25/2021 and in the interim, patient denies any changes to his health.   On exam today, Billy [name] reports that his energy levels are overall stable. He does have fatigue but feels that is secondary to deconditioning from COVID infection in March 2022. He is not as active as he used to be.  He denies any dietary changes or weight loss.  He denies any nausea, vomiting or abdominal pain.  His bowel habits  are unchanged without any diarrhea or constipation.  He denies easy bruising or signs of bleeding.  He denies any fevers, chills, night sweats, shortness of breath, chest pain or cough.  He has no other complaints.  Rest of the 10 point ROS is below.  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  2005   ? infected seb cyst    SOCIAL HISTORY: Social History   Socioeconomic History   Marital status: Married    Spouse name: Not on file   Number of children: Not on file   Years of education: Not on file   Highest education level: Not on file  Occupational History   Occupation: Chiropractor    Comment: semi-retired 2013  Tobacco Use   Smoking status: Never   Smokeless tobacco: Never  Substance and Sexual Activity   Alcohol use: Yes    Alcohol/week: 0.0 standard drinks   Drug use: No   Sexual activity: Not on file  Other Topics Concern   Not on file  Social History Narrative   Not on file   Social Determinants of Health   Financial Resource Strain: Not on file  Food Insecurity: Not on file  Transportation Needs: Not on file  Physical Activity: Not  on file  Stress: Not on file  Social Connections: Not on file  Intimate Partner Violence: Not on file    FAMILY HISTORY: Family History  Problem Relation Age of Onset   Heart disease Father    Cancer Brother        bone and liver    ALLERGIES:  is allergic to aspirin, clindamycin hcl, penicillins, and ramipril.  MEDICATIONS:  Current Outpatient Medications  Medication Sig Dispense Refill   amLODipine (NORVASC) 5 MG tablet       losartan-hydrochlorothiazide (HYZAAR) 50-12.5 MG tablet Take 1 tablet by mouth daily.     metoprolol succinate (TOPROL-XL) 100 MG 24 hr tablet Take 100 mg by mouth daily.      Multiple Vitamins-Minerals (MULTIVITAMIN MEN PO) Take by mouth daily.     No current facility-administered medications for this visit.    REVIEW OF SYSTEMS:   Constitutional: ( - ) fevers, ( - )  chills , ( - ) night sweats Eyes: ( - ) blurriness of vision, ( - ) double vision, ( - ) watery eyes Ears, nose, mouth, throat, and face: ( - ) mucositis, ( - ) sore throat Respiratory: ( - ) cough, ( - ) dyspnea, ( - ) wheezes Cardiovascular: ( - ) palpitation, ( - ) chest discomfort, ( - ) lower extremity swelling Gastrointestinal:  ( - ) nausea, ( - ) heartburn, ( - ) change in bowel habits Skin: ( - ) abnormal skin rashes Lymphatics: ( - ) new lymphadenopathy, ( - ) easy bruising Neurological: ( - ) numbness, ( - ) tingling, ( - ) new weaknesses Behavioral/Psych: ( - ) mood change, ( - ) new changes  All other systems were reviewed with the patient and are negative.  PHYSICAL EXAMINATION: ECOG PERFORMANCE STATUS: 1 - Symptomatic but completely ambulatory  Vitals:   01/20/22 1529  BP: (!) 112/54  Pulse: 73  Resp: 18  Temp: 97.7 F (36.5 C)  SpO2: 100%   Filed Weights   01/20/22 1529  Weight: 195 lb 4 oz (88.6 kg)    GENERAL: well appearing male in NAD  SKIN: skin color, texture, turgor are normal, no rashes or significant lesions EYES: conjunctiva are pink and non-injected, sclera clear OROPHARYNX: no exudate, no erythema; lips, buccal mucosa, and tongue normal  LYMPH:  no palpable lymphadenopathy in the cervical or supraclavicular lymph nodes.  LUNGS: clear to auscultation and percussion with normal breathing effort HEART: regular rate & rhythm and no murmurs and no lower extremity edema ABDOMEN: soft, non-tender, non-distended, normal bowel sounds Musculoskeletal: no cyanosis of digits and no clubbing   PSYCH: alert & oriented x 3, fluent speech NEURO: no focal motor/sensory deficits  LABORATORY DATA:  I have reviewed the data as listed CBC Latest Ref Rng & Units 01/20/2022 11/25/2021 07/28/2021  WBC 4.0 - 10.5 K/uL 2.8(L) 2.8(L) 2.6(L)  Hemoglobin 13.0 - 17.0 g/dL 8.8(L) 8.8(L) 9.9(L)  Hematocrit 39.0 - 52.0 % 28.6(L) 28.4(L) 31.2(L)  Platelets 150 - 400 K/uL 68(L) 58(L) 70(L)    CMP Latest Ref Rng & Units 03/30/2021 05/23/2019 10/14/2017  Glucose 70 - 99 mg/dL 95 98 87  BUN 8 - 23 mg/dL $Remove'15 14 12  'uhOpjkm$ Creatinine 0.61 - 1.24 mg/dL 0.99 1.15 1.00  Sodium 135 - 145 mmol/L 141 141 143  Potassium 3.5 - 5.1 mmol/L 4.3 4.1 3.8  Chloride 98 - 111 mmol/L 104 104 100  CO2 22 - 32 mmol/L $RemoveB'27 30 28  'QyEDUDhf$ Calcium 8.9 - 10.3 mg/dL  9.3 9.0 9.3  Total Protein 6.5 - 8.1 g/dL 6.5 6.5 6.6  Total Bilirubin 0.3 - 1.2 mg/dL 0.7 0.7 0.5  Alkaline Phos 38 - 126 U/L 46 45 47  AST 15 - 41 U/L $Remo'22 23 22  'IRbcG$ ALT 0 - 44 U/L $Remo'15 23 29     'lDTjc$ PATHOLOGY: Bone marrow biopsy on 11/04/2016:   Cytogenetic Analysis on 11/04/2016 was normal with no observable clonal chromosomal abnormalities.  ASSESSMENT & PLAN Billy [name] is a 81 y.o. male presenting to the clinic for pancytopenia with MPL mutation.    #Pancytopenia --Mutational analysis from 11/22/2016 confirmed MPL mutation that is present in 5% of patients with primary myelofibrosis.  --Labs today show persistent anemia and mild improvement of thrombocytopenia. Hgb is 8.8, Plt 68K. WBC is stable at 2.8.   --Strict precautions for bleeding. Explained that patient's fatigue and SOB likely secondary to progressive anemia.  -- Discussed importance of bone marrow biopsy to further evaluate worsening counts. Differentials include worsening myelofibrosis versus hematologic malignancy. Patient continues to decline our recommendation and would like to continue to monitor.  --Supportive care includes transfusion for hemoglobin less than 7 or platelets less than 20 --RTC to see Dr.  Lorenso Courier in 3 months with repeat labs.   No orders of the defined types were placed in this encounter.    All questions were answered. The patient knows to call the clinic with any problems, questions or concerns.  I have spent a total of 25 minutes minutes of face-to-face and non-face-to-face time, preparing to see the patient, performing a medically appropriate examination, counseling and educating the patient, ordering medications/tests/procedures, documenting clinical information in the electronic health record, and care coordination.   Lincoln Brigham, PA-C Department of Hematology/Oncology [place] at [facility] Phone: [phone]

## 2022-01-21 ENCOUNTER — Telehealth: Payer: Self-pay | Admitting: Physician Assistant

## 2022-01-21 NOTE — Telephone Encounter (Signed)
Scheduled per 2/1 los, message was left with pt

## 2022-04-19 ENCOUNTER — Inpatient Hospital Stay: Payer: Medicare HMO

## 2022-04-19 ENCOUNTER — Inpatient Hospital Stay: Payer: Medicare HMO | Admitting: Hematology and Oncology

## 2022-04-29 ENCOUNTER — Inpatient Hospital Stay: Payer: Medicare HMO | Attending: Physician Assistant

## 2022-04-29 ENCOUNTER — Inpatient Hospital Stay: Payer: Medicare HMO | Admitting: Hematology and Oncology

## 2022-04-29 ENCOUNTER — Other Ambulatory Visit: Payer: Self-pay

## 2022-04-29 VITALS — BP 122/53 | HR 65 | Temp 97.5°F | Resp 18 | Ht 71.0 in | Wt 195.1 lb

## 2022-04-29 DIAGNOSIS — D649 Anemia, unspecified: Secondary | ICD-10-CM

## 2022-04-29 DIAGNOSIS — D7581 Myelofibrosis: Secondary | ICD-10-CM

## 2022-04-29 DIAGNOSIS — D61818 Other pancytopenia: Secondary | ICD-10-CM | POA: Insufficient documentation

## 2022-04-29 DIAGNOSIS — Z8616 Personal history of COVID-19: Secondary | ICD-10-CM | POA: Insufficient documentation

## 2022-04-29 DIAGNOSIS — D696 Thrombocytopenia, unspecified: Secondary | ICD-10-CM

## 2022-04-29 LAB — CBC WITH DIFFERENTIAL (CANCER CENTER ONLY)
Abs Immature Granulocytes: 0.36 10*3/uL — ABNORMAL HIGH (ref 0.00–0.07)
Basophils Absolute: 0 10*3/uL (ref 0.0–0.1)
Basophils Relative: 1 %
Eosinophils Absolute: 0 10*3/uL (ref 0.0–0.5)
Eosinophils Relative: 1 %
HCT: 28 % — ABNORMAL LOW (ref 39.0–52.0)
Hemoglobin: 9.1 g/dL — ABNORMAL LOW (ref 13.0–17.0)
Immature Granulocytes: 12 %
Lymphocytes Relative: 15 %
Lymphs Abs: 0.5 10*3/uL — ABNORMAL LOW (ref 0.7–4.0)
MCH: 31.7 pg (ref 26.0–34.0)
MCHC: 32.5 g/dL (ref 30.0–36.0)
MCV: 97.6 fL (ref 80.0–100.0)
Monocytes Absolute: 0.7 10*3/uL (ref 0.1–1.0)
Monocytes Relative: 23 %
Neutro Abs: 1.6 10*3/uL — ABNORMAL LOW (ref 1.7–7.7)
Neutrophils Relative %: 48 %
Platelet Count: 65 10*3/uL — ABNORMAL LOW (ref 150–400)
RBC: 2.87 MIL/uL — ABNORMAL LOW (ref 4.22–5.81)
RDW: 18.7 % — ABNORMAL HIGH (ref 11.5–15.5)
WBC Count: 3.1 10*3/uL — ABNORMAL LOW (ref 4.0–10.5)
nRBC: 1.9 % — ABNORMAL HIGH (ref 0.0–0.2)

## 2022-04-29 LAB — SAMPLE TO BLOOD BANK

## 2022-04-29 NOTE — Progress Notes (Signed)
[place] Telephone:(336) [phone]   Fax:(336) [phone]  PROGRESS NOTE  Patient Care Team: Antony Contras, MD as PCP - General (Family Medicine)  Hematological/Oncological History 1) Patient was managed by Dr. Beryle Beams and more recently Dr. 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  -Plts between 40-60k dating back to 2013 -2009 - 2017: treated with thrombopoietin agonist (Promacta or N-plate) by Dr. 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 Tamela Oddi in [place]   2)Labs from PCP, Jonita Albee PA-C -02/07/2019: WBC 3.7, Hgb 14.0, MCV 91.8, Plt 68. -02/27/2020: WBC 2.2, Hgb 13.2, MCV 91.1, Plt 53 -03/23/2021: WBC 2.4 (L), Hgb 9.8 (L), MCV 96.4 (H), Plt count not reported due to clumping.   3) 03/30/2021: Re-establish care at [facility] with Dede Query PA-C  CHIEF COMPLAINTS/PURPOSE OF CONSULTATION:  "Pancytopenia"  HISTORY OF PRESENTING ILLNESS:  Billy [name] 81 y.o. male returns for a follow up visit for pancytopenia. He is unaccompanied for this visit. His last visit was on 01/26/2022 and in the interim, patient denies any changes to his health.   On exam today, Billy [name] reports he is feeling "old".  He notes his energy is a 6 out of 10 today.  He notes that ever since he developed COVID in February 2022 he has not felt like exercising and has not quite been himself.  He notes he does have issues with shortness of breath and does have some day-to-day limitations and he is not  able to do everything he used to do.  Fortunately has not had any infections in the interim since our last visit.  He also reports his appetite has been good and his weight has been stable.  He describes his health overall as "same old same old". He denies any nausea, vomiting or abdominal pain.  His bowel habits are unchanged without any diarrhea or constipation.  He denies easy bruising or signs of bleeding.  He denies any fevers, chills, night sweats, shortness of breath, chest pain or cough.  He has no other complaints.  Rest of the 10 point ROS is below.  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  2005   ? infected seb cyst    SOCIAL HISTORY: Social History   Socioeconomic History   Marital status: Married    Spouse name: Not on file   Number of children: Not on file   Years of education: Not on file   Highest education level: Not on file  Occupational History   Occupation: Chiropractor    Comment: semi-retired 2013  Tobacco Use   Smoking status: Never   Smokeless tobacco: Never  Substance and Sexual Activity   Alcohol use: Yes  Alcohol/week: 0.0 standard drinks   Drug use: No   Sexual activity: Not on file  Other Topics Concern   Not on file  Social History Narrative   Not on file   Social Determinants of Health   Financial Resource Strain: Not on file  Food Insecurity: Not on file  Transportation Needs: Not on file  Physical Activity: Not on file  Stress: Not on file  Social Connections: Not on file  Intimate Partner Violence: Not on file    FAMILY HISTORY: Family  History  Problem Relation Age of Onset   Heart disease Father    Cancer Brother        bone and liver    ALLERGIES:  is allergic to aspirin, clindamycin hcl, penicillins, and ramipril.  MEDICATIONS:  Current Outpatient Medications  Medication Sig Dispense Refill   amLODipine (NORVASC) 5 MG tablet      COVID-19 mRNA bivalent vaccine, Pfizer, (PFIZER COVID-19 VAC BIVALENT) injection Inject into the muscle. 0.3 mL 0   losartan-hydrochlorothiazide (HYZAAR) 50-12.5 MG tablet Take 1 tablet by mouth daily.     metoprolol succinate (TOPROL-XL) 100 MG 24 hr tablet Take 100 mg by mouth daily.      Multiple Vitamins-Minerals (MULTIVITAMIN MEN PO) Take by mouth daily.     No current facility-administered medications for this visit.    REVIEW OF SYSTEMS:   Constitutional: ( - ) fevers, ( - )  chills , ( - ) night sweats Eyes: ( - ) blurriness of vision, ( - ) double vision, ( - ) watery eyes Ears, nose, mouth, throat, and face: ( - ) mucositis, ( - ) sore throat Respiratory: ( - ) cough, ( - ) dyspnea, ( - ) wheezes Cardiovascular: ( - ) palpitation, ( - ) chest discomfort, ( - ) lower extremity swelling Gastrointestinal:  ( - ) nausea, ( - ) heartburn, ( - ) change in bowel habits Skin: ( - ) abnormal skin rashes Lymphatics: ( - ) new lymphadenopathy, ( - ) easy bruising Neurological: ( - ) numbness, ( - ) tingling, ( - ) new weaknesses Behavioral/Psych: ( - ) mood change, ( - ) new changes  All other systems were reviewed with the patient and are negative.  PHYSICAL EXAMINATION: ECOG PERFORMANCE STATUS: 1 - Symptomatic but completely ambulatory  Vitals:   04/29/22 1445  BP: (!) 122/53  Pulse: 65  Resp: 18  Temp: (!) 97.5 F (36.4 C)  SpO2: 96%   Filed Weights   04/29/22 1445  Weight: 195 lb 1.6 oz (88.5 kg)    GENERAL: well appearing male in NAD  SKIN: skin color, texture, turgor are normal, no rashes or significant lesions EYES: conjunctiva are pink and non-injected, sclera  clear OROPHARYNX: no exudate, no erythema; lips, buccal mucosa, and tongue normal  LYMPH:  no palpable lymphadenopathy in the cervical or supraclavicular lymph nodes.  LUNGS: clear to auscultation and percussion with normal breathing effort HEART: regular rate & rhythm and no murmurs and no lower extremity edema ABDOMEN: soft, non-tender, non-distended, normal bowel sounds Musculoskeletal: no cyanosis of digits and no clubbing  PSYCH: alert & oriented x 3, fluent speech NEURO: no focal motor/sensory deficits  LABORATORY DATA:  I have reviewed the data as listed    Latest Ref Rng & Units 04/29/2022    2:15 PM 01/20/2022    3:20 PM 11/25/2021   10:18 AM  CBC  WBC 4.0 - 10.5 K/uL 3.1   2.8   2.8  Hemoglobin 13.0 - 17.0 g/dL 9.1   8.8   8.8    Hematocrit 39.0 - 52.0 % 28.0   28.6   28.4    Platelets 150 - 400 K/uL 65   68   58         Latest Ref Rng & Units 03/30/2021    3:35 PM 05/23/2019    1:56 PM 10/14/2017    3:32 PM  CMP  Glucose 70 - 99 mg/dL 95   98   87    BUN 8 - 23 mg/dL _0 Creatinine 0.61 - 1.24 mg/dL 0.99   1.15   1.00    Sodium 135 - 145 mmol/L 141   141   143    Potassium 3.5 - 5.1 mmol/L 4.3   4.1   3.8    Chloride 98 - 111 mmol/L 104   104   100    CO2 22 - 32 mmol/L _1 Calcium 8.9 - 10.3 mg/dL 9.3   9.0   9.3    Total Protein 6.5 - 8.1 g/dL 6.5   6.5   6.6    Total Bilirubin 0.3 - 1.2 mg/dL 0.7   0.7   0.5    Alkaline Phos 38 - 126 U/L 46   45   47    AST 15 - 41 U/L _2 ALT 0 - 44 U/L _3 PATHOLOGY: Bone marrow biopsy on 11/04/2016:   Cytogenetic Analysis on 11/04/2016 was normal with no observable clonal chromosomal abnormalities.  ASSESSMENT & PLAN Billy [name] is a 81 y.o. male presenting to the clinic for pancytopenia with MPL mutation.    #Pancytopenia --Mutational analysis from 11/22/2016 confirmed MPL mutation that is present in 5% of patients with primary myelofibrosis.  --Labs today show  persistent anemia and mild improvement of thrombocytopenia. Hgb is 9.1, Plt 65 K. WBC is stable at 3.1   --Strict precautions for bleeding. Explained that patient's fatigue and SOB likely secondary to progressive anemia.  -- Discussed importance of bone marrow biopsy to further evaluate worsening counts. Differentials include worsening myelofibrosis versus hematologic malignancy. Patient continues to decline our recommendation and would like to continue to monitor.  --Supportive care includes transfusion for hemoglobin less than 7 or platelets less than 20 --RTC in 3 months with repeat labs.   No orders of the defined types were placed in this encounter.  All questions were answered. The patient knows to call the clinic with any problems, questions or concerns.  I have spent a total of 30 minutes minutes of face-to-face and non-face-to-face time, preparing to see the patient, performing a medically appropriate examination, counseling and educating the patient, ordering medications/tests/procedures, documenting clinical information in the electronic health record, and care coordination.   Ledell Peoples, MD Department of Hematology/Oncology [place] at [facility] Phone: [phone] Pager: [phone] Email: [email or web address]

## 2022-05-14 ENCOUNTER — Ambulatory Visit: Payer: Medicare HMO | Attending: Internal Medicine

## 2022-05-14 ENCOUNTER — Other Ambulatory Visit (HOSPITAL_BASED_OUTPATIENT_CLINIC_OR_DEPARTMENT_OTHER): Payer: Self-pay

## 2022-05-14 DIAGNOSIS — Z23 Encounter for immunization: Secondary | ICD-10-CM

## 2022-05-14 MED ORDER — PFIZER COVID-19 VAC BIVALENT 30 MCG/0.3ML IM SUSP
INTRAMUSCULAR | 0 refills | Status: DC
  Filled 2022-05-14: qty 0.3, 1d supply, fill #0

## 2022-05-14 NOTE — Progress Notes (Signed)
   Covid-19 Vaccination Clinic  Name:  Billy [name]    MRN: [id number] DOB: [birth year]  05/14/2022  [name] was observed post Covid-19 immunization for 15 minutes without incident. He was provided with Vaccine Information Sheet and instruction to access the V-Safe system.   [name] was instructed to call 911 with any severe reactions post vaccine: Difficulty breathing  Swelling of face and throat  A fast heartbeat  A bad rash all over body  Dizziness and weakness   Immunizations Administered     Name Date Dose VIS Date Route   Pfizer Covid-19 Vaccine Bivalent Booster 05/14/2022  2:21 PM 0.3 mL 08/19/2021 Intramuscular   Manufacturer: [place]   Lot: [id number]   [place]: [phone]

## 2022-05-18 ENCOUNTER — Telehealth: Payer: Self-pay | Admitting: Hematology and Oncology

## 2022-05-18 NOTE — Telephone Encounter (Signed)
Scheduled per 5/28 in basket, message has been left with pt

## 2022-08-18 ENCOUNTER — Inpatient Hospital Stay: Payer: Medicare HMO | Admitting: Hematology and Oncology

## 2022-08-18 ENCOUNTER — Other Ambulatory Visit: Payer: Self-pay | Admitting: Hematology and Oncology

## 2022-08-18 ENCOUNTER — Inpatient Hospital Stay: Payer: Medicare HMO | Attending: Hematology and Oncology

## 2022-08-18 ENCOUNTER — Other Ambulatory Visit: Payer: Self-pay

## 2022-08-18 VITALS — BP 121/58 | HR 69 | Temp 97.8°F | Resp 18 | Ht 71.0 in | Wt 192.1 lb

## 2022-08-18 DIAGNOSIS — D649 Anemia, unspecified: Secondary | ICD-10-CM | POA: Diagnosis not present

## 2022-08-18 DIAGNOSIS — D61818 Other pancytopenia: Secondary | ICD-10-CM | POA: Diagnosis not present

## 2022-08-18 DIAGNOSIS — D696 Thrombocytopenia, unspecified: Secondary | ICD-10-CM | POA: Diagnosis not present

## 2022-08-18 DIAGNOSIS — D7581 Myelofibrosis: Secondary | ICD-10-CM | POA: Insufficient documentation

## 2022-08-18 LAB — CMP (CANCER CENTER ONLY)
ALT: 13 U/L (ref 0–44)
AST: 20 U/L (ref 15–41)
Albumin: 3.8 g/dL (ref 3.5–5.0)
Alkaline Phosphatase: 45 U/L (ref 38–126)
Anion gap: 4 — ABNORMAL LOW (ref 5–15)
BUN: 15 mg/dL (ref 8–23)
CO2: 31 mmol/L (ref 22–32)
Calcium: 9 mg/dL (ref 8.9–10.3)
Chloride: 106 mmol/L (ref 98–111)
Creatinine: 1.11 mg/dL (ref 0.61–1.24)
GFR, Estimated: >60 mL/min (ref >60)
Glucose, Bld: 110 mg/dL — ABNORMAL HIGH (ref 70–99)
Potassium: 3.9 mmol/L (ref 3.5–5.1)
Sodium: 141 mmol/L (ref 135–145)
Total Bilirubin: 0.8 mg/dL (ref 0.3–1.2)
Total Protein: 6.2 g/dL — ABNORMAL LOW (ref 6.5–8.1)

## 2022-08-18 LAB — CBC WITH DIFFERENTIAL (CANCER CENTER ONLY)
Abs Immature Granulocytes: 0.5 10*3/uL — ABNORMAL HIGH (ref 0.00–0.07)
Basophils Absolute: 0 10*3/uL (ref 0.0–0.1)
Basophils Relative: 1 %
Eosinophils Absolute: 0 10*3/uL (ref 0.0–0.5)
Eosinophils Relative: 1 %
HCT: 28 % — ABNORMAL LOW (ref 39.0–52.0)
Hemoglobin: 9 g/dL — ABNORMAL LOW (ref 13.0–17.0)
Immature Granulocytes: 14 %
Lymphocytes Relative: 13 %
Lymphs Abs: 0.4 10*3/uL — ABNORMAL LOW (ref 0.7–4.0)
MCH: 31.6 pg (ref 26.0–34.0)
MCHC: 32.1 g/dL (ref 30.0–36.0)
MCV: 98.2 fL (ref 80.0–100.0)
Monocytes Absolute: 0.8 10*3/uL (ref 0.1–1.0)
Monocytes Relative: 23 %
Neutro Abs: 1.7 10*3/uL (ref 1.7–7.7)
Neutrophils Relative %: 48 %
Platelet Count: 69 10*3/uL — ABNORMAL LOW (ref 150–400)
RBC: 2.85 MIL/uL — ABNORMAL LOW (ref 4.22–5.81)
RDW: 18.7 % — ABNORMAL HIGH (ref 11.5–15.5)
WBC Count: 3.5 10*3/uL — ABNORMAL LOW (ref 4.0–10.5)
nRBC: 2 % — ABNORMAL HIGH (ref 0.0–0.2)

## 2022-08-18 LAB — SAMPLE TO BLOOD BANK

## 2022-08-18 NOTE — Progress Notes (Signed)
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 Beryle Beams and more recently Dr. 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  -Plts between 40-60k dating back to 2013 -2009 - 2017: treated with thrombopoietin agonist (Promacta or N-plate) by Dr. 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 Tamela Oddi in [place]   2)Labs from PCP, Jonita Albee PA-C -02/07/2019: WBC 3.7, Hgb 14.0, MCV 91.8, Plt 68. -02/27/2020: WBC 2.2, Hgb 13.2, MCV 91.1, Plt 53 -03/23/2021: WBC 2.4 (L), Hgb 9.8 (L), MCV 96.4 (H), Plt count not reported due to clumping.   3) 03/30/2021: Re-establish care at [facility] with Dede Query PA-C  HISTORY OF PRESENTING ILLNESS:  Billy [name] 81 y.o. male returns for a follow up visit for pancytopenia. He is unaccompanied for this visit. His last visit was on 04/29/2022 and in the interim, patient denies any changes to his health.   On exam today, Mr. Morgan reports he has been fine in the interim since her last visit.  He notes his energy levels have been "okay".  He notes that he continues to work out in the yard mostly pulling weeds.  He notes he has not had any trouble with infections.  Has had no pneumonias, urinary tract infections, or lung infections.   Additionally his weight has been steady and his appetite is been good.  He is not having any shortness of breath and has good exercise tolerance.  He notes his memory is not what it used to be and is "getting old".  He denies any fevers, chills, night sweats, shortness of breath, chest pain or cough.  He has no other complaints.  Rest of the 10 point ROS is below.  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  2005   ? infected seb cyst    SOCIAL HISTORY: Social History   Socioeconomic History   Marital status: Married    Spouse name: Not on file   Number of children: Not on file   Years of education: Not on file   Highest education level: Not on file  Occupational History   Occupation: Chiropractor    Comment: semi-retired 2013  Tobacco Use   Smoking status: Never   Smokeless tobacco: Never  Substance and Sexual Activity   Alcohol use: Yes    Alcohol/week: 0.0 standard drinks of alcohol   Drug use: No   Sexual activity: Not on file  Other Topics Concern   Not on file  Social History Narrative   Not on  file   Social Determinants of Health   Financial Resource Strain: Not on file  Food Insecurity: Not on file  Transportation Needs: Not on file  Physical Activity: Not on file  Stress: Not on file  Social Connections: Not on file  Intimate Partner Violence: Not on file    FAMILY HISTORY: Family History  Problem Relation Age of Onset   Heart disease Father    Cancer Brother        bone and liver    ALLERGIES:  is allergic to aspirin, clindamycin hcl, penicillins, and  ramipril.  MEDICATIONS:  Current Outpatient Medications  Medication Sig Dispense Refill   amLODipine (NORVASC) 5 MG tablet      COVID-19 mRNA bivalent vaccine, Pfizer, (PFIZER COVID-19 VAC BIVALENT) injection Inject into the muscle. 0.3 mL 0   losartan-hydrochlorothiazide (HYZAAR) 50-12.5 MG tablet Take 1 tablet by mouth daily.     metoprolol succinate (TOPROL-XL) 100 MG 24 hr tablet Take 100 mg by mouth daily.      Multiple Vitamins-Minerals (MULTIVITAMIN MEN PO) Take by mouth daily.     No current facility-administered medications for this visit.    REVIEW OF SYSTEMS:   Constitutional: ( - ) fevers, ( - )  chills , ( - ) night sweats Eyes: ( - ) blurriness of vision, ( - ) double vision, ( - ) watery eyes Ears, nose, mouth, throat, and face: ( - ) mucositis, ( - ) sore throat Respiratory: ( - ) cough, ( - ) dyspnea, ( - ) wheezes Cardiovascular: ( - ) palpitation, ( - ) chest discomfort, ( - ) lower extremity swelling Gastrointestinal:  ( - ) nausea, ( - ) heartburn, ( - ) change in bowel habits Skin: ( - ) abnormal skin rashes Lymphatics: ( - ) new lymphadenopathy, ( - ) easy bruising Neurological: ( - ) numbness, ( - ) tingling, ( - ) new weaknesses Behavioral/Psych: ( - ) mood change, ( - ) new changes  All other systems were reviewed with the patient and are negative.  PHYSICAL EXAMINATION: ECOG PERFORMANCE STATUS: 1 - Symptomatic but completely ambulatory  Vitals:   08/18/22 1130  BP: (!) 121/58  Pulse: 69  Resp: 18  Temp: 97.8 F (36.6 C)  SpO2: 98%   Filed Weights   08/18/22 1130  Weight: 192 lb 1.6 oz (87.1 kg)    GENERAL: well appearing male in NAD  SKIN: skin color, texture, turgor are normal, no rashes or significant lesions EYES: conjunctiva are pink and non-injected, sclera clear OROPHARYNX: no exudate, no erythema; lips, buccal mucosa, and tongue normal  LYMPH:  no palpable lymphadenopathy in the cervical or supraclavicular lymph nodes.  LUNGS: clear  to auscultation and percussion with normal breathing effort HEART: regular rate & rhythm and no murmurs and no lower extremity edema ABDOMEN: soft, non-tender, non-distended, normal bowel sounds Musculoskeletal: no cyanosis of digits and no clubbing  PSYCH: alert & oriented x 3, fluent speech NEURO: no focal motor/sensory deficits  LABORATORY DATA:  I have reviewed the data as listed    Latest Ref Rng & Units 08/18/2022   11:20 AM 04/29/2022    2:15 PM 01/20/2022    3:20 PM  CBC  WBC 4.0 - 10.5 K/uL 3.5  3.1  2.8   Hemoglobin 13.0 - 17.0 g/dL 9.0  9.1  8.8   Hematocrit 39.0 - 52.0 % 28.0  28.0  28.6   Platelets 150 - 400 K/uL 69  65  68  Latest Ref Rng & Units 08/18/2022   11:20 AM 03/30/2021    3:35 PM 05/23/2019    1:56 PM  CMP  Glucose 70 - 99 mg/dL 110  95  98   BUN 8 - 23 mg/dL _0 Creatinine 0.61 - 1.24 mg/dL 1.11  0.99  1.15   Sodium 135 - 145 mmol/L 141  141  141   Potassium 3.5 - 5.1 mmol/L 3.9  4.3  4.1   Chloride 98 - 111 mmol/L 106  104  104   CO2 22 - 32 mmol/L _1 Calcium 8.9 - 10.3 mg/dL 9.0  9.3  9.0   Total Protein 6.5 - 8.1 g/dL 6.2  6.5  6.5   Total Bilirubin 0.3 - 1.2 mg/dL 0.8  0.7  0.7   Alkaline Phos 38 - 126 U/L 45  46  45   AST 15 - 41 U/L _2 ALT 0 - 44 U/L _3 PATHOLOGY: Bone marrow biopsy on 11/04/2016:   Cytogenetic Analysis on 11/04/2016 was normal with no observable clonal chromosomal abnormalities.  ASSESSMENT & PLAN TREVAR BOEHRINGER is a 81 y.o. male presenting to the clinic for pancytopenia with MPL mutation.    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  --Strict return precautions for bleeding. Explained that patient's fatigue and SOB likely secondary to progressive anemia.  -- Discussed importance of bone marrow biopsy to further evaluate worsening  counts. Differentials include worsening myelofibrosis versus hematologic malignancy. Patient continues to decline our recommendation and would like to continue to monitor.  --Supportive care includes transfusion for hemoglobin less than 7 or platelets less than 20 Plan:  --labs today show WBC 3.5, Hgb 9.0, MCV 98.2, Plt 69 --RTC in 6 months with repeat labs in 3 months.   No orders of the defined types were placed in this encounter.  All questions were answered. The patient knows to call the clinic with any problems, questions or concerns.  I have spent a total of 30 minutes minutes of face-to-face and non-face-to-face time, preparing to see the patient, performing a medically appropriate examination, counseling and educating the patient, ordering medications/tests/procedures, documenting clinical information in the electronic health record, and care coordination.   Ledell Peoples, MD Department of Hematology/Oncology [place] at [facility] Phone: [phone] Pager: [phone] Email: [email or web address]

## 2022-09-02 DIAGNOSIS — R413 Other amnesia: Secondary | ICD-10-CM | POA: Diagnosis not present

## 2022-09-02 DIAGNOSIS — M7732 Calcaneal spur, left foot: Secondary | ICD-10-CM | POA: Diagnosis not present

## 2022-09-02 DIAGNOSIS — R739 Hyperglycemia, unspecified: Secondary | ICD-10-CM | POA: Diagnosis not present

## 2022-09-02 DIAGNOSIS — Z23 Encounter for immunization: Secondary | ICD-10-CM | POA: Diagnosis not present

## 2022-09-02 DIAGNOSIS — I10 Essential (primary) hypertension: Secondary | ICD-10-CM | POA: Diagnosis not present

## 2022-09-02 DIAGNOSIS — Z0000 Encounter for general adult medical examination without abnormal findings: Secondary | ICD-10-CM | POA: Diagnosis not present

## 2022-09-02 DIAGNOSIS — D61818 Other pancytopenia: Secondary | ICD-10-CM | POA: Diagnosis not present

## 2022-09-02 DIAGNOSIS — Z1331 Encounter for screening for depression: Secondary | ICD-10-CM | POA: Diagnosis not present

## 2022-09-02 DIAGNOSIS — E782 Mixed hyperlipidemia: Secondary | ICD-10-CM | POA: Diagnosis not present

## 2022-09-22 ENCOUNTER — Telehealth: Payer: Self-pay | Admitting: *Deleted

## 2022-09-22 NOTE — Chronic Care Management (AMB) (Signed)
  Care Coordination  Outreach Note  09/22/2022 Name: Billy [name] MRN: [id number] DOB: 1941/04/23   Care Coordination Outreach Attempts: An unsuccessful telephone outreach was attempted today to offer the patient information about available care coordination services as a benefit of their health plan.   Follow Up Plan:  Additional outreach attempts will be made to offer the patient care coordination information and services.   Encounter Outcome:  No Answer  [place]  Direct Dial: [phone]

## 2022-09-23 ENCOUNTER — Encounter: Payer: Self-pay | Admitting: Hematology and Oncology

## 2022-09-24 DIAGNOSIS — U071 COVID-19: Secondary | ICD-10-CM | POA: Diagnosis not present

## 2022-09-24 DIAGNOSIS — R5383 Other fatigue: Secondary | ICD-10-CM | POA: Diagnosis not present

## 2022-09-24 DIAGNOSIS — R059 Cough, unspecified: Secondary | ICD-10-CM | POA: Diagnosis not present

## 2022-09-24 DIAGNOSIS — R0981 Nasal congestion: Secondary | ICD-10-CM | POA: Diagnosis not present

## 2022-09-27 NOTE — Chronic Care Management (AMB) (Unsigned)
  Care Coordination  Outreach Note  09/27/2022 Name: Billy [name] MRN: [id number] DOB: 05/15/1941   Care Coordination Outreach Attempts: A second unsuccessful outreach was attempted today to offer the patient with information about available care coordination services as a benefit of their health plan.     Follow Up Plan:  Additional outreach attempts will be made to offer the patient care coordination information and services.   Encounter Outcome:  No Answer  [place]  Direct Dial: [phone]

## 2022-09-28 NOTE — Chronic Care Management (AMB) (Signed)
  Care Coordination  Outreach Note  09/28/2022 Name: ARVIN ABELLO MRN: [id number] DOB: 1941/09/13   Care Coordination Outreach Attempts: An unsuccessful telephone outreach was attempted today to offer the patient information about available care coordination services as a benefit of their health plan.   Follow Up Plan:  Additional outreach attempts will be made to offer the patient care coordination information and services.   Encounter Outcome:  No Answer  [place]  Direct Dial: [phone]

## 2022-09-28 NOTE — Chronic Care Management (AMB) (Signed)
  Care Coordination   Note   09/28/2022 Name: Billy [name] MRN: [id number] DOB: [birth year]  Billy [name] is a 81 y.o. year old male who sees Antony Contras, MD for primary care. I reached out to Jenene Slicker by phone today to offer care coordination services.  Billy [name] was given information about Care Coordination services today including:   The Care Coordination services include support from the care team which includes your Nurse Coordinator, Clinical Social Worker, or Pharmacist.  The Care Coordination team is here to help remove barriers to the health concerns and goals most important to you. Care Coordination services are voluntary, and the patient may decline or stop services at any time by request to their care team member.   Care Coordination Consent Status: Patient agreed to services and verbal consent obtained.   Follow up plan:  Telephone appointment with care coordination team member scheduled for:  10/06/22  Encounter Outcome:  Pt. Scheduled  [place]  Direct Dial: [phone]

## 2022-10-06 ENCOUNTER — Ambulatory Visit: Payer: Self-pay

## 2022-10-06 NOTE — Patient Outreach (Signed)
  Care Coordination   10/06/2022 Name: Billy [name] MRN: [id number] DOB: Oct 07, 1941   Care Coordination Outreach Attempts:  An unsuccessful telephone outreach was attempted today to offer the patient information about available care coordination services as a benefit of their health plan.   Follow Up Plan:  Additional outreach attempts will be made to offer the patient care coordination information and services.   Encounter Outcome:  No Answer  Care Coordination Interventions Activated:  No   Care Coordination Interventions:  No, not indicated    Lazaro Arms RN, BSN, [facility]   Phone: [phone]

## 2022-10-26 ENCOUNTER — Telehealth: Payer: Self-pay | Admitting: *Deleted

## 2022-10-26 NOTE — Progress Notes (Signed)
  Care Coordination  Outreach Note  10/26/2022 Name: FELIZ LINCOLN MRN: [id number] DOB: [birth year]   Care Coordination Outreach Attempts: An unsuccessful telephone outreach was attempted today to offer the patient information about available care coordination services as a benefit of their health plan.   Follow Up Plan:  Additional outreach attempts will be made to offer the patient care coordination information and services.   Encounter Outcome:  No Answer  [place]  Direct Dial: [phone]

## 2022-10-26 NOTE — Progress Notes (Signed)
  Care Coordination   Note   10/26/2022 Name: Billy [name] MRN: [id number] DOB: [birth year]  [name] is a 81 y.o. year old male who sees Antony Contras, MD for primary care. I reached out to Jenene Slicker by phone today to offer care coordination services.  [name] was given information about Care Coordination services today including:   The Care Coordination services include support from the care team which includes your Nurse Coordinator, Clinical Social Worker, or Pharmacist.  The Care Coordination team is here to help remove barriers to the health concerns and goals most important to you. Care Coordination services are voluntary, and the patient may decline or stop services at any time by request to their care team member.   Care Coordination Consent Status: Patient agreed to services and verbal consent obtained.   Follow up plan:  Telephone appointment with care coordination team member scheduled for:  10/29/22  Encounter Outcome:  Pt. Scheduled  [place]  Direct Dial: [phone]

## 2022-10-29 ENCOUNTER — Ambulatory Visit: Payer: Self-pay

## 2022-10-29 NOTE — Patient Instructions (Signed)
Visit Information  Thank you for taking time to visit with me today. Please don't hesitate to contact me if I can be of assistance to you.   Following are the goals we discussed today:   Goals Addressed             This Visit's Progress    COMPLETED: Care Coordination Activities- No follow up required        Care Coordination Interventions: Active listening / Reflection utilized  Emotional Support Provided Problem [place] strategies reviewed Discussed/.Educated Care Coordination Program 2.   Discussed/.Educated Annual Wellness Visit 3.   Discussed/.Educated Social Determinates of Health 4.   Please inform PCP if services needed in the future           If you are experiencing a Mental Health or [place] or need someone to talk to, please call 1-800-273-TALK (toll free, 24 hour hotline)  Patient verbalizes understanding of instructions and care plan provided today and agrees to view in [place]. Active MyChart status and patient understanding of how to access instructions and care plan via MyChart confirmed with patient.     Lazaro Arms RN, BSN, [facility]   Phone: [phone]

## 2022-10-29 NOTE — Patient Outreach (Signed)
  Care Coordination   Initial Visit Note   10/29/2022 Name: Billy [name] MRN: [id number] DOB: June 16, 1941  Billy [name] is a 81 y.o. year old male who sees Antony Contras, MD for primary care. I spoke with  Jenene Slicker and his wife by phone today.  What matters to the patients health and wellness today?  He has no concerns about his health he can talk with his physician   Goals Addressed             This Visit's Progress    COMPLETED: Care Coordination Activities- No follow up required        Care Coordination Interventions: Active listening / Reflection utilized  Emotional Support Provided Problem [place] strategies reviewed Discussed/.Educated Care Coordination Program 2.   Discussed/.Educated Annual Wellness Visit 3.   Discussed/.Educated Social Determinates of Health 4.   Please inform PCP if services needed in the future         SDOH assessments and interventions completed:  Yes  SDOH Interventions Today    Flowsheet Row Most Recent Value  SDOH Interventions   Food Insecurity Interventions Intervention Not Indicated  Transportation Interventions Intervention Not Indicated        Care Coordination Interventions Activated:  Yes  Care Coordination Interventions:  Yes, provided   Follow up plan: No further intervention required.   Encounter Outcome:  Pt. Visit Completed   Lazaro Arms RN, BSN, [facility]   Phone: [phone]

## 2022-11-18 ENCOUNTER — Inpatient Hospital Stay: Payer: Medicare HMO | Attending: Hematology and Oncology

## 2022-11-18 ENCOUNTER — Other Ambulatory Visit: Payer: Self-pay

## 2022-11-18 ENCOUNTER — Other Ambulatory Visit: Payer: Self-pay | Admitting: *Deleted

## 2022-11-18 ENCOUNTER — Other Ambulatory Visit: Payer: Self-pay | Admitting: Hematology and Oncology

## 2022-11-18 DIAGNOSIS — D7581 Myelofibrosis: Secondary | ICD-10-CM

## 2022-11-18 DIAGNOSIS — D61818 Other pancytopenia: Secondary | ICD-10-CM | POA: Insufficient documentation

## 2022-11-18 LAB — CBC WITH DIFFERENTIAL (CANCER CENTER ONLY)
Abs Immature Granulocytes: 0.28 10*3/uL — ABNORMAL HIGH (ref 0.00–0.07)
Basophils Absolute: 0 10*3/uL (ref 0.0–0.1)
Basophils Relative: 1 %
Eosinophils Absolute: 0 10*3/uL (ref 0.0–0.5)
Eosinophils Relative: 0 %
HCT: 28.2 % — ABNORMAL LOW (ref 39.0–52.0)
Hemoglobin: 8.9 g/dL — ABNORMAL LOW (ref 13.0–17.0)
Immature Granulocytes: 10 %
Lymphocytes Relative: 17 %
Lymphs Abs: 0.5 10*3/uL — ABNORMAL LOW (ref 0.7–4.0)
MCH: 32.1 pg (ref 26.0–34.0)
MCHC: 31.6 g/dL (ref 30.0–36.0)
MCV: 101.8 fL — ABNORMAL HIGH (ref 80.0–100.0)
Monocytes Absolute: 0.6 10*3/uL (ref 0.1–1.0)
Monocytes Relative: 23 %
Neutro Abs: 1.3 10*3/uL — ABNORMAL LOW (ref 1.7–7.7)
Neutrophils Relative %: 49 %
Platelet Count: 63 10*3/uL — ABNORMAL LOW (ref 150–400)
RBC: 2.77 MIL/uL — ABNORMAL LOW (ref 4.22–5.81)
RDW: 18.7 % — ABNORMAL HIGH (ref 11.5–15.5)
WBC Count: 2.8 10*3/uL — ABNORMAL LOW (ref 4.0–10.5)
nRBC: 3.3 % — ABNORMAL HIGH (ref 0.0–0.2)

## 2022-11-18 LAB — CMP (CANCER CENTER ONLY)
ALT: 14 U/L (ref 0–44)
AST: 19 U/L (ref 15–41)
Albumin: 3.9 g/dL (ref 3.5–5.0)
Alkaline Phosphatase: 47 U/L (ref 38–126)
Anion gap: 4 — ABNORMAL LOW (ref 5–15)
BUN: 16 mg/dL (ref 8–23)
CO2: 32 mmol/L (ref 22–32)
Calcium: 9.2 mg/dL (ref 8.9–10.3)
Chloride: 107 mmol/L (ref 98–111)
Creatinine: 1.01 mg/dL (ref 0.61–1.24)
GFR, Estimated: >60 mL/min (ref >60)
Glucose, Bld: 119 mg/dL — ABNORMAL HIGH (ref 70–99)
Potassium: 3.8 mmol/L (ref 3.5–5.1)
Sodium: 143 mmol/L (ref 135–145)
Total Bilirubin: 0.8 mg/dL (ref 0.3–1.2)
Total Protein: 6.4 g/dL — ABNORMAL LOW (ref 6.5–8.1)

## 2022-11-28 ENCOUNTER — Encounter (HOSPITAL_COMMUNITY): Payer: Self-pay | Admitting: *Deleted

## 2022-11-28 ENCOUNTER — Ambulatory Visit (HOSPITAL_COMMUNITY)
Admission: EM | Admit: 2022-11-28 | Discharge: 2022-11-28 | Disposition: A | Discharge status: Home / Self Care | Payer: Medicare HMO

## 2022-11-28 DIAGNOSIS — J069 Acute upper respiratory infection, unspecified: Secondary | ICD-10-CM | POA: Diagnosis not present

## 2022-11-28 MED ORDER — AZITHROMYCIN 250 MG PO TABS: 250.0000 mg | ORAL_TABLET | Freq: Every day | ORAL | 0 refills | Status: DC

## 2022-11-28 MED ORDER — HYDROCODONE BIT-HOMATROP MBR 5-1.5 MG/5ML PO SOLN: 5.0000 mL | Freq: Two times a day (BID) | ORAL | 0 refills | Status: DC | PRN

## 2022-11-28 MED ORDER — BENZONATATE 100 MG PO CAPS: 100.0000 mg | ORAL_CAPSULE | Freq: Three times a day (TID) | ORAL | 0 refills | Status: DC

## 2022-11-28 NOTE — Discharge Instructions (Addendum)
Since your symptoms have begun to worsen we will begin an antibiotic to provide coverage for bacteria which may be aiding to your symptoms, begin azithromycin as directed  At this time as the lungs are clear when listened to and oxygen level is greater than 90 without any assistance we will hold off on doing a chest x-ray unless symptoms begin to worsen  You may use Tessalon pill every 8 hours to help calm coughing  You may use Hycodan every 12 hours as needed for additional comfort, please be mindful this medicine will make you drowsy    You can take Tylenol and/or Ibuprofen as needed for fever reduction and pain relief.   For cough: honey 1/2 to 1 teaspoon (you can dilute the honey in water or another fluid).  You can also use guaifenesin and dextromethorphan for cough. You can use a humidifier for chest congestion and cough.  If you don't have a humidifier, you can sit in the bathroom with the hot shower running.      For sore throat: try warm salt water gargles, cepacol lozenges, throat spray, warm tea or water with lemon/honey, popsicles or ice, or OTC cold relief medicine for throat discomfort.   For congestion: take a daily anti-histamine like Zyrtec, Claritin, and a oral decongestant, such as pseudoephedrine.  You can also use Flonase 1-2 sprays in each nostril daily.   It is important to stay hydrated: drink plenty of fluids (water, gatorade/powerade/pedialyte, juices, or teas) to keep your throat moisturized and help further relieve irritation/discomfort.

## 2022-11-28 NOTE — ED Triage Notes (Signed)
Assessment per provider 

## 2022-12-01 NOTE — ED Provider Notes (Signed)
Billy    CSN: [name] Arrival date & time: 11/28/22  1707      History   Chief Complaint Chief Complaint  Patient presents with   Cough    HPI Billy [name] is a 81 y.o. male.   Patient presents for nasal congestion, rhinorrhea and a nonproductive cough for 5 days.  Has not been to experience a mild shortness of breath with exertion, resolves with rest, worse at nighttime.  No known sick contacts.  Tolerating food and liquids.  History of leukopenia, hypertension, ITP.  Has attempted use of Mucinex which has been minimally effective.  Denies wheezing, chest pain or tightness, fevers.  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  2005   ? infected seb cyst       Home Medications    Prior to Admission medications   Medication Sig Start Date End Date Taking? Authorizing Provider  amLODipine (NORVASC) 5 MG tablet  02/22/12  Yes [provider]  azithromycin (ZITHROMAX) 250 MG tablet Take 1 tablet (250 mg total) by mouth daily. Take first 2 tablets together, then 1 every day until finished. 11/28/22  Yes Gabrielle Wakeland R, NP  benzonatate (TESSALON) 100 MG capsule Take 1 capsule (100 mg total) by mouth every 8 (eight) hours. 11/28/22  Yes Billy [name] R, NP   HYDROcodone bit-homatropine (HYCODAN) 5-1.5 MG/5ML syrup Take 5 mLs by mouth 2 (two) times daily as needed for cough. 11/28/22  Yes Billy [name] R, NP  losartan-hydrochlorothiazide (HYZAAR) 50-12.5 MG tablet Take 1 tablet by mouth daily.   Yes [provider]  metoprolol succinate (TOPROL-XL) 100 MG 24 hr tablet Take 100 mg by mouth daily.  02/22/12  Yes [provider]  Multiple Vitamins-Minerals (MULTIVITAMIN MEN PO) Take by mouth daily.   Yes [provider]  Probiotic Product (PROBIOTIC PO) Take by mouth.   Yes [provider]  COVID-19 mRNA bivalent vaccine, Pfizer, (PFIZER COVID-19 VAC BIVALENT) injection Inject into the muscle. 05/14/22   Carlyle Basques, MD    Family History Family History  Problem Relation Age of Onset   Heart disease Father    Cancer Brother        bone and liver    Social History Social History   Tobacco Use   Smoking status: Never   Smokeless tobacco: Never  Substance Use Topics   Alcohol use: Yes    Alcohol/week: 0.0 standard drinks of alcohol   Drug use: No     Allergies   Aspirin, Clindamycin hcl, Penicillins, and Ramipril   Review of Systems Review of Systems Defer to HPI   Physical Exam Triage Vital Signs ED Triage Vitals  Enc Vitals Group     BP 11/28/22 1800 (!) 141/56  Pulse Rate 11/28/22 1800 77     Resp 11/28/22 1800 20     Temp 11/28/22 1800 98 F (36.7 C)     Temp Source 11/28/22 1800 Oral     SpO2 11/28/22 1800 97 %     Weight --      Height --      Head Circumference --      Peak Flow --      Pain Score 11/28/22 1819 0     Pain Loc --      Pain Edu? --      Excl. in [place]? --    No data found.  Updated Vital Signs BP (!) 141/56   Pulse 77   Temp 98 F (36.7 C) (Oral)   Resp 20   SpO2 97%   Visual Acuity Right Eye Distance:   Left Eye Distance:   Bilateral Distance:    Right Eye Near:   Left Eye Near:    Bilateral Near:     Physical Exam Constitutional:       Appearance: Normal appearance.  HENT:     Head: Normocephalic.     Right Ear: Tympanic membrane, ear canal and external ear normal.     Left Ear: Tympanic membrane, ear canal and external ear normal.     Nose: Congestion and rhinorrhea present.     Mouth/Throat:     Mouth: Mucous membranes are moist.     Pharynx: No posterior oropharyngeal erythema.  Eyes:     Extraocular Movements: Extraocular movements intact.  Cardiovascular:     Rate and Rhythm: Normal rate and regular rhythm.     Pulses: Normal pulses.     Heart sounds: Normal heart sounds.  Pulmonary:     Effort: Pulmonary effort is normal.     Breath sounds: Normal breath sounds.  Skin:    General: Skin is warm and dry.  Neurological:     Mental Status: He is alert and oriented to person, place, and time. Mental status is at baseline.  Psychiatric:        Mood and Affect: Mood normal.        Behavior: Behavior normal.      UC Treatments / Results  Labs (all labs ordered are listed, but only abnormal results are displayed) Labs Reviewed - No data to display  EKG   Radiology No results found.  Procedures Procedures (including critical care time)  Medications Ordered in UC Medications - No data to display  Initial Impression / Assessment and Plan / UC Course  I have reviewed the triage vital signs and the nursing notes.  Pertinent labs & imaging results that were available during my care of the patient were reviewed by me and considered in my medical decision making (see chart for details).  Acute upper respiratory infection  As symptoms are progressing with no improvement we will prophylactically provide bacterial coverage, azithromycin prescribed as well as Tessalon and hycodan as patient endorses this with this medicine in the past, will defer x-ray imaging as lungs are clear to auscultation and O2 saturation is greater than 90, discussed with patient and spouse, advised continued use of over-the-counter  medications as needed with follow-up with urgent care if symptoms persist or worsen Final Clinical Impressions(s) / UC Diagnoses   Final diagnoses:  Acute upper respiratory infection     Discharge Instructions      Since your symptoms have begun to worsen we will begin an antibiotic to provide coverage for  bacteria which may be aiding to your symptoms, begin azithromycin as directed  At this time as the lungs are clear when listened to and oxygen level is greater than 90 without any assistance we will hold off on doing a chest x-ray unless symptoms begin to worsen  You may use Tessalon pill every 8 hours to help calm coughing  You may use Hycodan every 12 hours as needed for additional comfort, please be mindful this medicine will make you drowsy    You can take Tylenol and/or Ibuprofen as needed for fever reduction and pain relief.   For cough: honey 1/2 to 1 teaspoon (you can dilute the honey in water or another fluid).  You can also use guaifenesin and dextromethorphan for cough. You can use a humidifier for chest congestion and cough.  If you don't have a humidifier, you can sit in the bathroom with the hot shower running.      For sore throat: try warm salt water gargles, cepacol lozenges, throat spray, warm tea or water with lemon/honey, popsicles or ice, or OTC cold relief medicine for throat discomfort.   For congestion: take a daily anti-histamine like Zyrtec, Claritin, and a oral decongestant, such as pseudoephedrine.  You can also use Flonase 1-2 sprays in each nostril daily.   It is important to stay hydrated: drink plenty of fluids (water, gatorade/powerade/pedialyte, juices, or teas) to keep your throat moisturized and help further relieve irritation/discomfort.    ED Prescriptions     Medication Sig Dispense Auth. Provider   azithromycin (ZITHROMAX) 250 MG tablet Take 1 tablet (250 mg total) by mouth daily. Take first 2 tablets together, then 1 every day until  finished. 6 tablet Billy [name] R, NP   benzonatate (TESSALON) 100 MG capsule Take 1 capsule (100 mg total) by mouth every 8 (eight) hours. 21 capsule Billy [name] R, NP   HYDROcodone bit-homatropine (HYCODAN) 5-1.5 MG/5ML syrup Take 5 mLs by mouth 2 (two) times daily as needed for cough. 120 mL Billy [name], Leitha Schuller, NP      I have reviewed the PDMP during this encounter.   Billy [name], [place] 12/01/22 [phone]

## 2023-01-04 ENCOUNTER — Other Ambulatory Visit (HOSPITAL_BASED_OUTPATIENT_CLINIC_OR_DEPARTMENT_OTHER): Payer: Self-pay

## 2023-01-04 MED ORDER — COMIRNATY 30 MCG/0.3ML IM SUSY
PREFILLED_SYRINGE | INTRAMUSCULAR | 0 refills | Status: DC
  Filled 2023-01-04: qty 0.3, 1d supply, fill #0

## 2023-01-05 ENCOUNTER — Other Ambulatory Visit (HOSPITAL_BASED_OUTPATIENT_CLINIC_OR_DEPARTMENT_OTHER): Payer: Self-pay

## 2023-02-17 ENCOUNTER — Inpatient Hospital Stay: Payer: Medicare HMO | Attending: Hematology and Oncology

## 2023-02-17 ENCOUNTER — Inpatient Hospital Stay (HOSPITAL_BASED_OUTPATIENT_CLINIC_OR_DEPARTMENT_OTHER): Payer: Medicare HMO | Admitting: Hematology and Oncology

## 2023-02-17 ENCOUNTER — Other Ambulatory Visit: Payer: Self-pay | Admitting: Hematology and Oncology

## 2023-02-17 VITALS — BP 143/57 | HR 73 | Temp 97.7°F | Resp 19 | Wt 192.1 lb

## 2023-02-17 DIAGNOSIS — D7581 Myelofibrosis: Secondary | ICD-10-CM | POA: Diagnosis not present

## 2023-02-17 DIAGNOSIS — D649 Anemia, unspecified: Secondary | ICD-10-CM

## 2023-02-17 DIAGNOSIS — D61818 Other pancytopenia: Secondary | ICD-10-CM | POA: Insufficient documentation

## 2023-02-17 DIAGNOSIS — D471 Chronic myeloproliferative disease: Secondary | ICD-10-CM | POA: Diagnosis not present

## 2023-02-17 DIAGNOSIS — D696 Thrombocytopenia, unspecified: Secondary | ICD-10-CM | POA: Diagnosis not present

## 2023-02-17 LAB — CMP (CANCER CENTER ONLY)
ALT: 12 U/L (ref 0–44)
AST: 19 U/L (ref 15–41)
Albumin: 3.7 g/dL (ref 3.5–5.0)
Alkaline Phosphatase: 43 U/L (ref 38–126)
Anion gap: 3 — ABNORMAL LOW (ref 5–15)
BUN: 14 mg/dL (ref 8–23)
CO2: 31 mmol/L (ref 22–32)
Calcium: 8.5 mg/dL — ABNORMAL LOW (ref 8.9–10.3)
Chloride: 107 mmol/L (ref 98–111)
Creatinine: 1 mg/dL (ref 0.61–1.24)
GFR, Estimated: >60 mL/min (ref >60)
Glucose, Bld: 105 mg/dL — ABNORMAL HIGH (ref 70–99)
Potassium: 3.8 mmol/L (ref 3.5–5.1)
Sodium: 141 mmol/L (ref 135–145)
Total Bilirubin: 0.8 mg/dL (ref 0.3–1.2)
Total Protein: 5.8 g/dL — ABNORMAL LOW (ref 6.5–8.1)

## 2023-02-17 LAB — CBC WITH DIFFERENTIAL (CANCER CENTER ONLY)
Abs Immature Granulocytes: 0.22 10*3/uL — ABNORMAL HIGH (ref 0.00–0.07)
Basophils Absolute: 0 10*3/uL (ref 0.0–0.1)
Basophils Relative: 1 %
Eosinophils Absolute: 0 10*3/uL (ref 0.0–0.5)
Eosinophils Relative: 0 %
HCT: 26.6 % — ABNORMAL LOW (ref 39.0–52.0)
Hemoglobin: 8.4 g/dL — ABNORMAL LOW (ref 13.0–17.0)
Immature Granulocytes: 8 %
Lymphocytes Relative: 16 %
Lymphs Abs: 0.4 10*3/uL — ABNORMAL LOW (ref 0.7–4.0)
MCH: 31.9 pg (ref 26.0–34.0)
MCHC: 31.6 g/dL (ref 30.0–36.0)
MCV: 101.1 fL — ABNORMAL HIGH (ref 80.0–100.0)
Monocytes Absolute: 0.6 10*3/uL (ref 0.1–1.0)
Monocytes Relative: 23 %
Neutro Abs: 1.4 10*3/uL — ABNORMAL LOW (ref 1.7–7.7)
Neutrophils Relative %: 52 %
Platelet Count: 60 10*3/uL — ABNORMAL LOW (ref 150–400)
RBC: 2.63 MIL/uL — ABNORMAL LOW (ref 4.22–5.81)
RDW: 18.4 % — ABNORMAL HIGH (ref 11.5–15.5)
WBC Count: 2.8 10*3/uL — ABNORMAL LOW (ref 4.0–10.5)
nRBC: 4 % — ABNORMAL HIGH (ref 0.0–0.2)

## 2023-02-17 NOTE — Progress Notes (Signed)
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 Beryle Beams and more recently Dr. 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  -Plts between 40-60k dating back to 2013 -2009 - 2017: treated with thrombopoietin agonist (Promacta or N-plate) by Dr. 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 Tamela Oddi in [place]   2)Labs from PCP, Jonita Albee PA-C -02/07/2019: WBC 3.7, Hgb 14.0, MCV 91.8, Plt 68. -02/27/2020: WBC 2.2, Hgb 13.2, MCV 91.1, Plt 53 -03/23/2021: WBC 2.4 (L), Hgb 9.8 (L), MCV 96.4 (H), Plt count not reported due to clumping.   3) 03/30/2021: Re-establish care at [facility] with Dede Query PA-C  HISTORY OF PRESENTING ILLNESS:  Billy [name] 82 y.o. male returns for a follow up visit for pancytopenia. He is unaccompanied for this visit. His last visit was on 08/18/2022 and in the interim, patient denies any changes to his health.   On exam today, Billy [name] reports he has had no major changes in his health in the interim since her last visit 6 months ago.  He reports he has had no changes in his medications, ER visits, or hospitalizations.  He reports has not been having any trouble with bleeding, bruising, or dark stools.  He reports his energy is "not much"  and currently ranks it as about a 6 out of 10.  He reports that he is able to get through the day.  He notes that he has a wife and a Restaurant manager, fast food at home.  He reports that he is not having any issues with abdominal distention or abdominal pain.  He does have some occasional bouts of eczema, predominantly affects him on his abdomen.  He notes that his memory is fading and he thinks that this is just a part of "getting older".  Overall he is at his baseline level of health and has no questions concerns or complaints today.  He denies any fevers, chills, night sweats, shortness of breath, chest pain or cough.  Rest of the 10 point ROS is below.  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  2005   ? infected seb cyst    SOCIAL HISTORY: Social History   Socioeconomic History   Marital status: Married    Spouse name: Not on file   Number of children: Not on file   Years of education: Not on file   Highest education level: Not on file  Occupational History   Occupation: Chiropractor    Comment: semi-retired 2013  Tobacco Use  Smoking status: Never   Smokeless tobacco: Never  Substance and Sexual Activity   Alcohol use: Yes    Alcohol/week: 0.0 standard drinks of alcohol   Drug use: No   Sexual activity: Not on file  Other Topics Concern   Not on file  Social History Narrative   Not on file   Social Determinants of Health   Financial Resource Strain: Not on file  Food Insecurity: Not on file  Transportation Needs: Not on file  Physical Activity: Not on file  Stress: Not on file  Social  Connections: Not on file  Intimate Partner Violence: Not on file    FAMILY HISTORY: Family History  Problem Relation Age of Onset   Heart disease Father    Cancer Brother        bone and liver    ALLERGIES:  is allergic to aspirin, clindamycin hcl, penicillins, and ramipril.  MEDICATIONS:  Current Outpatient Medications  Medication Sig Dispense Refill   amLODipine (NORVASC) 5 MG tablet      losartan-hydrochlorothiazide (HYZAAR) 50-12.5 MG tablet Take 1 tablet by mouth daily.     metoprolol succinate (TOPROL-XL) 100 MG 24 hr tablet Take 100 mg by mouth daily.      Multiple Vitamins-Minerals (MULTIVITAMIN MEN PO) Take by mouth daily.     No current facility-administered medications for this visit.    REVIEW OF SYSTEMS:   Constitutional: ( - ) fevers, ( - )  chills , ( - ) night sweats Eyes: ( - ) blurriness of vision, ( - ) double vision, ( - ) watery eyes Ears, nose, mouth, throat, and face: ( - ) mucositis, ( - ) sore throat Respiratory: ( - ) cough, ( - ) dyspnea, ( - ) wheezes Cardiovascular: ( - ) palpitation, ( - ) chest discomfort, ( - ) lower extremity swelling Gastrointestinal:  ( - ) nausea, ( - ) heartburn, ( - ) change in bowel habits Skin: ( - ) abnormal skin rashes Lymphatics: ( - ) new lymphadenopathy, ( - ) easy bruising Neurological: ( - ) numbness, ( - ) tingling, ( - ) new weaknesses Behavioral/Psych: ( - ) mood change, ( - ) new changes  All other systems were reviewed with the patient and are negative.  PHYSICAL EXAMINATION: ECOG PERFORMANCE STATUS: 1 - Symptomatic but completely ambulatory  Vitals:   02/17/23 1108  BP: (!) 143/57  Pulse: 73  Resp: 19  Temp: 97.7 F (36.5 C)  SpO2: 98%    Filed Weights   02/17/23 1108  Weight: 192 lb 2 oz (87.1 kg)     GENERAL: well appearing male in NAD  SKIN: skin color, texture, turgor are normal, no rashes or significant lesions EYES: conjunctiva are pink and non-injected, sclera clear OROPHARYNX: no  exudate, no erythema; lips, buccal mucosa, and tongue normal  LYMPH:  no palpable lymphadenopathy in the cervical or supraclavicular lymph nodes.  LUNGS: clear to auscultation and percussion with normal breathing effort HEART: regular rate & rhythm and no murmurs and no lower extremity edema ABDOMEN: soft, non-tender, non-distended, normal bowel sounds Musculoskeletal: no cyanosis of digits and no clubbing  PSYCH: alert & oriented x 3, fluent speech NEURO: no focal motor/sensory deficits  LABORATORY DATA:  I have reviewed the data as listed    Latest Ref Rng & Units 02/17/2023   10:57 AM 11/18/2022   10:20 AM 08/18/2022   11:20 AM  CBC  WBC 4.0 - 10.5 K/uL 2.8  2.8  3.5  Hemoglobin 13.0 - 17.0 g/dL 8.4  8.9  9.0   Hematocrit 39.0 - 52.0 % 26.6  28.2  28.0   Platelets 150 - 400 K/uL 60  63  69        Latest Ref Rng & Units 02/17/2023   10:57 AM 11/18/2022   10:20 AM 08/18/2022   11:20 AM  CMP  Glucose 70 - 99 mg/dL 105  119  110   BUN 8 - 23 mg/dL '14  16  15   '$ Creatinine 0.61 - 1.24 mg/dL 1.00  1.01  1.11   Sodium 135 - 145 mmol/L 141  143  141   Potassium 3.5 - 5.1 mmol/L 3.8  3.8  3.9   Chloride 98 - 111 mmol/L 107  107  106   CO2 22 - 32 mmol/L 31  32  31   Calcium 8.9 - 10.3 mg/dL 8.5  9.2  9.0   Total Protein 6.5 - 8.1 g/dL 5.8  6.4  6.2   Total Bilirubin 0.3 - 1.2 mg/dL 0.8  0.8  0.8   Alkaline Phos 38 - 126 U/L 43  47  45   AST 15 - 41 U/L '19  19  20   '$ ALT 0 - 44 U/L '12  14  13      '$ PATHOLOGY: Bone marrow biopsy on 11/04/2016:   Cytogenetic Analysis on 11/04/2016 was normal with no observable clonal chromosomal abnormalities.  ASSESSMENT & PLAN Billy [name] is a 82 y.o. male presenting to the clinic for pancytopenia with MPL mutation.    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  --Strict return precautions for  bleeding. Explained that patient's fatigue and SOB likely secondary to progressive anemia.  -- Discussed importance of bone marrow biopsy to further evaluate worsening counts. Differentials include worsening myelofibrosis versus hematologic malignancy. Patient continues to decline our recommendation and would like to continue to monitor.  --Supportive care includes transfusion for hemoglobin less than 7 or platelets less than 20 Plan:  --labs today show WBC 2.8, hemoglobin 8.4, MCV 101.1, and platelets of 60 --RTC in 6 months with repeat labs in 3 months.   No orders of the defined types were placed in this encounter.  All questions were answered. The patient knows to call the clinic with any problems, questions or concerns.  I have spent a total of 25 minutes minutes of face-to-face and non-face-to-face time, preparing to see the patient, performing a medically appropriate examination, counseling and educating the patient, ordering medications/tests/procedures, documenting clinical information in the electronic health record, and care coordination.   Ledell Peoples, MD Department of Hematology/Oncology [place] at [facility] Phone: [phone] Pager: [phone] Email: [email or web address]

## 2023-05-02 DIAGNOSIS — D649 Anemia, unspecified: Secondary | ICD-10-CM | POA: Diagnosis not present

## 2023-05-02 DIAGNOSIS — J3489 Other specified disorders of nose and nasal sinuses: Secondary | ICD-10-CM | POA: Diagnosis not present

## 2023-05-02 DIAGNOSIS — R413 Other amnesia: Secondary | ICD-10-CM | POA: Diagnosis not present

## 2023-05-03 ENCOUNTER — Telehealth: Payer: Self-pay | Admitting: Hematology and Oncology

## 2023-05-04 ENCOUNTER — Ambulatory Visit
Admission: RE | Admit: 2023-05-04 | Discharge: 2023-05-04 | Disposition: A | Discharge status: Home / Self Care | Payer: Medicare HMO | Source: Ambulatory Visit | Attending: Family Medicine | Admitting: Family Medicine

## 2023-05-04 ENCOUNTER — Telehealth: Payer: Self-pay | Admitting: Hematology and Oncology

## 2023-05-04 ENCOUNTER — Other Ambulatory Visit: Payer: Self-pay | Admitting: Family Medicine

## 2023-05-04 DIAGNOSIS — J3489 Other specified disorders of nose and nasal sinuses: Secondary | ICD-10-CM

## 2023-05-04 DIAGNOSIS — R6884 Jaw pain: Secondary | ICD-10-CM | POA: Diagnosis not present

## 2023-05-04 DIAGNOSIS — J324 Chronic pansinusitis: Secondary | ICD-10-CM | POA: Diagnosis not present

## 2023-05-06 ENCOUNTER — Inpatient Hospital Stay: Payer: Medicare HMO | Attending: Hematology and Oncology

## 2023-05-06 ENCOUNTER — Other Ambulatory Visit: Payer: Self-pay | Admitting: Hematology and Oncology

## 2023-05-06 ENCOUNTER — Other Ambulatory Visit: Payer: Self-pay

## 2023-05-06 DIAGNOSIS — Z79899 Other long term (current) drug therapy: Secondary | ICD-10-CM | POA: Diagnosis not present

## 2023-05-06 DIAGNOSIS — D61818 Other pancytopenia: Secondary | ICD-10-CM | POA: Diagnosis not present

## 2023-05-06 DIAGNOSIS — D7581 Myelofibrosis: Secondary | ICD-10-CM

## 2023-05-06 LAB — CBC WITH DIFFERENTIAL (CANCER CENTER ONLY)
Abs Immature Granulocytes: 0.95 10*3/uL — ABNORMAL HIGH (ref 0.00–0.07)
Basophils Absolute: 0 10*3/uL (ref 0.0–0.1)
Basophils Relative: 0 %
Eosinophils Absolute: 0 10*3/uL (ref 0.0–0.5)
Eosinophils Relative: 0 %
HCT: 22.5 % — ABNORMAL LOW (ref 39.0–52.0)
Hemoglobin: 7 g/dL — ABNORMAL LOW (ref 13.0–17.0)
Immature Granulocytes: 20 %
Lymphocytes Relative: 9 %
Lymphs Abs: 0.4 10*3/uL — ABNORMAL LOW (ref 0.7–4.0)
MCH: 31.3 pg (ref 26.0–34.0)
MCHC: 31.1 g/dL (ref 30.0–36.0)
MCV: 100.4 fL — ABNORMAL HIGH (ref 80.0–100.0)
Monocytes Absolute: 0.7 10*3/uL (ref 0.1–1.0)
Monocytes Relative: 14 %
Neutro Abs: 2.8 10*3/uL (ref 1.7–7.7)
Neutrophils Relative %: 57 %
Platelet Count: 120 10*3/uL — ABNORMAL LOW (ref 150–400)
RBC: 2.24 MIL/uL — ABNORMAL LOW (ref 4.22–5.81)
RDW: 18.4 % — ABNORMAL HIGH (ref 11.5–15.5)
WBC Count: 4.9 10*3/uL (ref 4.0–10.5)
nRBC: 1.2 % — ABNORMAL HIGH (ref 0.0–0.2)

## 2023-05-06 LAB — CMP (CANCER CENTER ONLY)
ALT: 11 U/L (ref 0–44)
AST: 14 U/L — ABNORMAL LOW (ref 15–41)
Albumin: 3.3 g/dL — ABNORMAL LOW (ref 3.5–5.0)
Alkaline Phosphatase: 48 U/L (ref 38–126)
Anion gap: 4 — ABNORMAL LOW (ref 5–15)
BUN: 13 mg/dL (ref 8–23)
CO2: 30 mmol/L (ref 22–32)
Calcium: 8.3 mg/dL — ABNORMAL LOW (ref 8.9–10.3)
Chloride: 104 mmol/L (ref 98–111)
Creatinine: 1.18 mg/dL (ref 0.61–1.24)
GFR, Estimated: >60 mL/min (ref >60)
Glucose, Bld: 160 mg/dL — ABNORMAL HIGH (ref 70–99)
Potassium: 3.9 mmol/L (ref 3.5–5.1)
Sodium: 138 mmol/L (ref 135–145)
Total Bilirubin: 0.4 mg/dL (ref 0.3–1.2)
Total Protein: 6.1 g/dL — ABNORMAL LOW (ref 6.5–8.1)

## 2023-05-13 ENCOUNTER — Other Ambulatory Visit: Payer: Self-pay | Admitting: *Deleted

## 2023-05-13 ENCOUNTER — Inpatient Hospital Stay: Payer: Medicare HMO

## 2023-05-13 ENCOUNTER — Telehealth: Payer: Self-pay | Admitting: *Deleted

## 2023-05-13 ENCOUNTER — Telehealth: Payer: Self-pay | Admitting: Hematology and Oncology

## 2023-05-13 DIAGNOSIS — D7581 Myelofibrosis: Secondary | ICD-10-CM

## 2023-05-13 DIAGNOSIS — Z79899 Other long term (current) drug therapy: Secondary | ICD-10-CM | POA: Diagnosis not present

## 2023-05-13 DIAGNOSIS — D61818 Other pancytopenia: Secondary | ICD-10-CM | POA: Diagnosis not present

## 2023-05-13 LAB — CBC WITH DIFFERENTIAL (CANCER CENTER ONLY)
Abs Immature Granulocytes: 0.58 10*3/uL — ABNORMAL HIGH (ref 0.00–0.07)
Basophils Absolute: 0 10*3/uL (ref 0.0–0.1)
Basophils Relative: 0 %
Eosinophils Absolute: 0 10*3/uL (ref 0.0–0.5)
Eosinophils Relative: 0 %
HCT: 23.8 % — ABNORMAL LOW (ref 39.0–52.0)
Hemoglobin: 7.5 g/dL — ABNORMAL LOW (ref 13.0–17.0)
Immature Granulocytes: 13 %
Lymphocytes Relative: 10 %
Lymphs Abs: 0.4 10*3/uL — ABNORMAL LOW (ref 0.7–4.0)
MCH: 31.8 pg (ref 26.0–34.0)
MCHC: 31.5 g/dL (ref 30.0–36.0)
MCV: 100.8 fL — ABNORMAL HIGH (ref 80.0–100.0)
Monocytes Absolute: 0.9 10*3/uL (ref 0.1–1.0)
Monocytes Relative: 19 %
Neutro Abs: 2.5 10*3/uL (ref 1.7–7.7)
Neutrophils Relative %: 58 %
Platelet Count: 77 10*3/uL — ABNORMAL LOW (ref 150–400)
RBC: 2.36 MIL/uL — ABNORMAL LOW (ref 4.22–5.81)
RDW: 19.6 % — ABNORMAL HIGH (ref 11.5–15.5)
WBC Count: 4.4 10*3/uL (ref 4.0–10.5)
nRBC: 2.5 % — ABNORMAL HIGH (ref 0.0–0.2)

## 2023-05-13 LAB — PREPARE RBC (CROSSMATCH)

## 2023-05-13 LAB — SAMPLE TO BLOOD BANK

## 2023-05-13 LAB — ABO/RH: ABO/RH(D): O POS

## 2023-05-13 NOTE — Telephone Encounter (Signed)
Received call from pt's wife. She is calling regarding labs that were done on 05/06/23. She also states he is not feeling well at all. He is very fatigued, no energy some SOB with activity. Very slow moving around. Reviewed labs with her. Advised that his HGB is 7.0.Advised I would discuss with Dr. Leonides Schanz and then let her know what he recommends. Discussed with Dr. Leonides Schanz. He recommends blood transfusion tomorrow  and then repeat again on 05/20/23 and a follow up appt with him on 05/24/23. Advised the wife of the above. She and Mr. Alvarenga are agreeable.  Met with pt in the lobby. He is very pale and very weak. Advised that we will get a sample of his blood for the blood bank today and then we have scheduled him for a transfusion tomorrow, Saturday @ 8:30 am. Advised about the follow up appts as well. Pt and his wife are agreeable.  Scheduling message sent for f/u appts. On 05/20/23 and 05/24/23

## 2023-05-14 ENCOUNTER — Other Ambulatory Visit: Payer: Self-pay | Admitting: Hematology and Oncology

## 2023-05-14 ENCOUNTER — Inpatient Hospital Stay: Payer: Medicare HMO

## 2023-05-14 ENCOUNTER — Other Ambulatory Visit: Payer: Self-pay

## 2023-05-14 DIAGNOSIS — Z79899 Other long term (current) drug therapy: Secondary | ICD-10-CM | POA: Diagnosis not present

## 2023-05-14 DIAGNOSIS — D7581 Myelofibrosis: Secondary | ICD-10-CM

## 2023-05-14 DIAGNOSIS — D61818 Other pancytopenia: Secondary | ICD-10-CM | POA: Diagnosis not present

## 2023-05-14 MED ORDER — DIPHENHYDRAMINE HCL 50 MG/ML IJ SOLN
25.0000 mg | Freq: Once | INTRAMUSCULAR | Status: AC
  Administered 2023-05-14: 25 mg via INTRAVENOUS

## 2023-05-14 MED ORDER — SODIUM CHLORIDE 0.9% IV SOLUTION
250.0000 mL | Freq: Once | INTRAVENOUS | Status: AC
  Administered 2023-05-14: 250 mL via INTRAVENOUS

## 2023-05-14 MED ORDER — ACETAMINOPHEN 325 MG PO TABS
650.0000 mg | ORAL_TABLET | Freq: Once | ORAL | Status: AC
  Administered 2023-05-14: 650 mg via ORAL
  Filled 2023-05-14: qty 2

## 2023-05-14 MED ORDER — FAMOTIDINE IN NACL 20-0.9 MG/50ML-% IV SOLN
20.0000 mg | Freq: Once | INTRAVENOUS | Status: AC
  Administered 2023-05-14: 20 mg via INTRAVENOUS

## 2023-05-14 NOTE — Progress Notes (Signed)
Pt here for first time blood transfusion.  Completed infusion without difficulty.   Approx.  15 ml of blood left in the tubing prior to normal saline flush,  nurse was notified  by Ladean Raya, LPN of pt c/o  eyes itching.  Blood stopped immediately.  Normal saline hung wide open via gravity.  VSs taken and documented in Epic.  Pt remained  A&O x 4.  Wife at bedside. Mild hypersensitivity protocol initiated.  Benadryl 25 mg  IVP, and  Pepcid 20 mg IVPB given.  Pt denied chest tightness, denied breathing problems, denied pain.  Noted eyelids swollen and mildly red.    Able to take sips of water and swallow without problems.    Pt was being monitored very closely. Dr. Leonides Schanz notified of above issues.  Per MD,  no steroids needed at present.  MD would not consider this event as blood transfusion reaction since blood bag was completely empty. Once pt is stable , pt can be discharged home with wife. Per wife,  pt has several allergies including seasonal allergies.  Pt was instructed to take Claritin for allergy relief.  Pt/ wife verbalized understanding.

## 2023-05-14 NOTE — Patient Instructions (Signed)

## 2023-05-15 LAB — BPAM RBC
Blood Product Expiration Date: 202406292359
ISSUE DATE / TIME: 202405250916
Unit Type and Rh: 5100

## 2023-05-15 LAB — TYPE AND SCREEN
ABO/RH(D): O POS
Antibody Screen: NEGATIVE
Unit division: 0

## 2023-05-19 ENCOUNTER — Other Ambulatory Visit: Payer: Self-pay

## 2023-05-19 DIAGNOSIS — D7581 Myelofibrosis: Secondary | ICD-10-CM

## 2023-05-20 ENCOUNTER — Inpatient Hospital Stay: Payer: Medicare HMO

## 2023-05-20 DIAGNOSIS — D7581 Myelofibrosis: Secondary | ICD-10-CM

## 2023-05-20 DIAGNOSIS — D61818 Other pancytopenia: Secondary | ICD-10-CM | POA: Diagnosis not present

## 2023-05-20 DIAGNOSIS — Z79899 Other long term (current) drug therapy: Secondary | ICD-10-CM | POA: Diagnosis not present

## 2023-05-20 LAB — CMP (CANCER CENTER ONLY)
ALT: 17 U/L (ref 0–44)
AST: 21 U/L (ref 15–41)
Albumin: 3.7 g/dL (ref 3.5–5.0)
Alkaline Phosphatase: 56 U/L (ref 38–126)
Anion gap: 5 (ref 5–15)
BUN: 15 mg/dL (ref 8–23)
CO2: 31 mmol/L (ref 22–32)
Calcium: 9 mg/dL (ref 8.9–10.3)
Chloride: 105 mmol/L (ref 98–111)
Creatinine: 1.15 mg/dL (ref 0.61–1.24)
GFR, Estimated: >60 mL/min (ref >60)
Glucose, Bld: 162 mg/dL — ABNORMAL HIGH (ref 70–99)
Potassium: 4.1 mmol/L (ref 3.5–5.1)
Sodium: 141 mmol/L (ref 135–145)
Total Bilirubin: 0.6 mg/dL (ref 0.3–1.2)
Total Protein: 6.5 g/dL (ref 6.5–8.1)

## 2023-05-20 LAB — CBC WITH DIFFERENTIAL (CANCER CENTER ONLY)
Abs Immature Granulocytes: 0.8 10*3/uL — ABNORMAL HIGH (ref 0.00–0.07)
Band Neutrophils: 9 %
Basophils Absolute: 0 10*3/uL (ref 0.0–0.1)
Basophils Relative: 0 %
Eosinophils Absolute: 0 10*3/uL (ref 0.0–0.5)
Eosinophils Relative: 1 %
HCT: 29.6 % — ABNORMAL LOW (ref 39.0–52.0)
Hemoglobin: 9.1 g/dL — ABNORMAL LOW (ref 13.0–17.0)
Lymphocytes Relative: 14 %
Lymphs Abs: 0.6 10*3/uL — ABNORMAL LOW (ref 0.7–4.0)
MCH: 30.5 pg (ref 26.0–34.0)
MCHC: 30.7 g/dL (ref 30.0–36.0)
MCV: 99.3 fL (ref 80.0–100.0)
Metamyelocytes Relative: 18 %
Monocytes Absolute: 0.7 10*3/uL (ref 0.1–1.0)
Monocytes Relative: 17 %
Myelocytes: 2 %
Neutro Abs: 1.9 10*3/uL (ref 1.7–7.7)
Neutrophils Relative %: 39 %
Platelet Count: 85 10*3/uL — ABNORMAL LOW (ref 150–400)
RBC: 2.98 MIL/uL — ABNORMAL LOW (ref 4.22–5.81)
RDW: 19.6 % — ABNORMAL HIGH (ref 11.5–15.5)
WBC Count: 4 10*3/uL (ref 4.0–10.5)
nRBC: 1.2 % — ABNORMAL HIGH (ref 0.0–0.2)

## 2023-05-24 ENCOUNTER — Encounter: Payer: Self-pay | Admitting: *Deleted

## 2023-05-24 ENCOUNTER — Other Ambulatory Visit: Payer: Self-pay

## 2023-05-24 ENCOUNTER — Inpatient Hospital Stay: Payer: Medicare HMO | Attending: Hematology and Oncology | Admitting: Hematology and Oncology

## 2023-05-24 VITALS — BP 106/58 | HR 71 | Temp 98.0°F | Resp 13 | Wt 186.3 lb

## 2023-05-24 DIAGNOSIS — D696 Thrombocytopenia, unspecified: Secondary | ICD-10-CM

## 2023-05-24 DIAGNOSIS — D693 Immune thrombocytopenic purpura: Secondary | ICD-10-CM | POA: Diagnosis not present

## 2023-05-24 DIAGNOSIS — D7581 Myelofibrosis: Secondary | ICD-10-CM | POA: Diagnosis not present

## 2023-05-24 DIAGNOSIS — D649 Anemia, unspecified: Secondary | ICD-10-CM | POA: Diagnosis not present

## 2023-05-24 DIAGNOSIS — D61818 Other pancytopenia: Secondary | ICD-10-CM | POA: Diagnosis not present

## 2023-05-24 NOTE — Progress Notes (Signed)
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 Cyndie Chime and more recently Dr. 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  -Plts between 40-60k dating back to 2013 -2009 - 2017: treated with thrombopoietin agonist (Promacta or N-plate) by Dr. 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 Yetta Numbers in [place]   2)Labs from PCP, Marita Snellen PA-C -02/07/2019: WBC 3.7, Hgb 14.0, MCV 91.8, Plt 68. -02/27/2020: WBC 2.2, Hgb 13.2, MCV 91.1, Plt 53 -03/23/2021: WBC 2.4 (L), Hgb 9.8 (L), MCV 96.4 (H), Plt count not reported due to clumping.   3) 03/30/2021: Re-establish care at [facility] with Georga Kaufmann PA-C  HISTORY OF PRESENTING ILLNESS:  Billy [name] 82 y.o. male returns for a follow up visit for pancytopenia. He is unaccompanied for this visit. His last visit was on 02/17/2023 and in the interim, patient denies any changes to his health.   On exam today, Billy [name] is accompanied by his wife.  He reports that he is feeling better in the interim since his labs were low in May.  He notes he did have a bad sinus infection and it has subsequently resolved with antibiotic therapy.  He notes that he does still have some occasional pressure and is going to be seeing ENT to  further evaluate this.  He has not been having any issues with bleeding, bruising, or dark stools.  He denies any shortness of breath, lightheadedness or dizziness.  He reports his energy is about a 5 or 6 out of 10.  His appetite is getting stronger and he is eating well.  He notes that overall everything is steady at this time.  He denies any fevers, chills, night sweats, shortness of breath, chest pain or cough.  Rest of the 10 point ROS is below.  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  2005   ? infected seb cyst    SOCIAL HISTORY: Social History   Socioeconomic History   Marital status: Married    Spouse name: Not on file   Number of children: Not on file   Years of education: Not on file   Highest education level: Not on file  Occupational History   Occupation: Chiropractor    Comment: semi-retired 2013  Tobacco Use   Smoking status: Never   Smokeless tobacco: Never  Substance and Sexual Activity   Alcohol use: Yes    Alcohol/week: 0.0 standard drinks of alcohol   Drug use: No   Sexual activity: Not on file  Other Topics Concern   Not on file  Social History Narrative   Not on file   Social Determinants of Health   Financial Resource Strain: Not on file  Food Insecurity: Not on file  Transportation Needs: Not on file  Physical Activity: Not on file  Stress: Not on file  Social Connections: Not on file  Intimate Partner Violence: Not on file    FAMILY HISTORY: Family History  Problem Relation Age of Onset   Heart disease Father    Cancer Brother        bone and  liver    ALLERGIES:  is allergic to aspirin, clindamycin hcl, penicillins, and ramipril.  MEDICATIONS:  Current Outpatient Medications  Medication Sig Dispense Refill   amLODipine (NORVASC) 5 MG tablet      losartan-hydrochlorothiazide (HYZAAR) 50-12.5 MG tablet Take 1 tablet by mouth daily.     metoprolol succinate (TOPROL-XL) 100 MG 24 hr tablet Take 100 mg by mouth daily.      Multiple Vitamins-Minerals (MULTIVITAMIN MEN PO) Take by mouth daily.     No current facility-administered medications for this visit.    REVIEW OF SYSTEMS:   Constitutional: ( - ) fevers, ( - )  chills , ( - ) night sweats Eyes: ( - ) blurriness of vision, ( - ) double vision, ( - ) watery eyes Ears, nose, mouth, throat, and face: ( - ) mucositis, ( - ) sore throat Respiratory: ( - ) cough, ( - ) dyspnea, ( - ) wheezes Cardiovascular: ( - ) palpitation, ( - ) chest discomfort, ( - ) lower extremity swelling Gastrointestinal:  ( - ) nausea, ( - ) heartburn, ( - ) change in bowel habits Skin: ( - ) abnormal skin rashes Lymphatics: ( - ) new lymphadenopathy, ( - ) easy bruising Neurological: ( - ) numbness, ( - ) tingling, ( - ) new weaknesses Behavioral/Psych: ( - ) mood change, ( - ) new changes  All other systems were reviewed with the patient and are negative.  PHYSICAL EXAMINATION: ECOG PERFORMANCE STATUS: 1 - Symptomatic but completely ambulatory  Vitals:   05/24/23 1115  BP: (!) 106/58  Pulse: 71  Resp: 13  Temp: 98 F (36.7 C)  SpO2: 97%     Filed Weights   05/24/23 1115  Weight: 186 lb 4.8 oz (84.5 kg)      GENERAL: well appearing male in NAD  SKIN: skin color, texture, turgor are normal, no rashes or significant lesions EYES: conjunctiva are pink and non-injected, sclera clear OROPHARYNX: no exudate, no erythema; lips, buccal mucosa, and tongue normal  LYMPH:  no palpable lymphadenopathy in the cervical or supraclavicular lymph nodes.  LUNGS: clear to auscultation and percussion  with normal breathing effort HEART: regular rate & rhythm and no murmurs and no lower extremity edema ABDOMEN: soft, non-tender, non-distended, normal bowel sounds Musculoskeletal: no cyanosis of digits and no clubbing  PSYCH: alert & oriented x 3, fluent speech NEURO: no focal motor/sensory deficits  LABORATORY DATA:  I have reviewed the data as listed    Latest Ref Rng & Units 05/20/2023   11:35 AM 05/13/2023    2:32 PM 05/06/2023   12:00 PM  CBC  WBC 4.0 - 10.5 K/uL 4.0  4.4  4.9   Hemoglobin 13.0 - 17.0 g/dL 9.1  7.5  7.0   Hematocrit 39.0 - 52.0 % 29.6  23.8  22.5   Platelets 150 - 400 K/uL 85  77  120  Latest Ref Rng & Units 05/20/2023   11:35 AM 05/06/2023   12:00 PM 02/17/2023   10:57 AM  CMP  Glucose 70 - 99 mg/dL 161  096  045   BUN 8 - 23 mg/dL 15  13  14    Creatinine 0.61 - 1.24 mg/dL 4.09  8.11  9.14   Sodium 135 - 145 mmol/L 141  138  141   Potassium 3.5 - 5.1 mmol/L 4.1  3.9  3.8   Chloride 98 - 111 mmol/L 105  104  107   CO2 22 - 32 mmol/L 31  30  31    Calcium 8.9 - 10.3 mg/dL 9.0  8.3  8.5   Total Protein 6.5 - 8.1 g/dL 6.5  6.1  5.8   Total Bilirubin 0.3 - 1.2 mg/dL 0.6  0.4  0.8   Alkaline Phos 38 - 126 U/L 56  48  43   AST 15 - 41 U/L 21  14  19    ALT 0 - 44 U/L 17  11  12       PATHOLOGY: Bone marrow biopsy on 11/04/2016:   Cytogenetic Analysis on 11/04/2016 was normal with no observable clonal chromosomal abnormalities.  ASSESSMENT & PLAN Billy [name] is a 82 y.o. male presenting to the clinic for pancytopenia with MPL mutation.    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  --Strict return precautions for bleeding. Explained that patient's fatigue and SOB likely secondary to progressive anemia.  -- Discussed importance of bone marrow biopsy to further evaluate worsening counts. Differentials include  worsening myelofibrosis versus hematologic malignancy. Patient continues to decline our recommendation and would like to continue to monitor.  --Supportive care includes transfusion for hemoglobin less than 7 or platelets less than 20 Plan:  --labs today show WBC 4.0, Hgb 9.1, MCV 99.3, Plt 85 --RTC in 6 months with repeat labs in 3 months.   No orders of the defined types were placed in this encounter.  All questions were answered. The patient knows to call the clinic with any problems, questions or concerns.  I have spent a total of 25 minutes minutes of face-to-face and non-face-to-face time, preparing to see the patient, performing a medically appropriate examination, counseling and educating the patient, ordering medications/tests/procedures, documenting clinical information in the electronic health record, and care coordination.   Ulysees Barns, MD Department of Hematology/Oncology [facility] Phone: [phone] Pager: [phone] Email: [email or web address]

## 2023-05-25 ENCOUNTER — Telehealth: Payer: Self-pay | Admitting: Hematology and Oncology

## 2023-05-26 DIAGNOSIS — J324 Chronic pansinusitis: Secondary | ICD-10-CM | POA: Diagnosis not present

## 2023-05-26 DIAGNOSIS — J343 Hypertrophy of nasal turbinates: Secondary | ICD-10-CM | POA: Diagnosis not present

## 2023-06-28 DIAGNOSIS — B354 Tinea corporis: Secondary | ICD-10-CM | POA: Diagnosis not present

## 2023-06-28 DIAGNOSIS — L820 Inflamed seborrheic keratosis: Secondary | ICD-10-CM | POA: Diagnosis not present

## 2023-07-25 DIAGNOSIS — B354 Tinea corporis: Secondary | ICD-10-CM | POA: Diagnosis not present

## 2023-07-25 DIAGNOSIS — D485 Neoplasm of uncertain behavior of skin: Secondary | ICD-10-CM | POA: Diagnosis not present

## 2023-07-25 DIAGNOSIS — B078 Other viral warts: Secondary | ICD-10-CM | POA: Diagnosis not present

## 2023-08-01 DIAGNOSIS — R350 Frequency of micturition: Secondary | ICD-10-CM | POA: Diagnosis not present

## 2023-08-04 DIAGNOSIS — R350 Frequency of micturition: Secondary | ICD-10-CM | POA: Diagnosis not present

## 2023-08-04 DIAGNOSIS — D693 Immune thrombocytopenic purpura: Secondary | ICD-10-CM | POA: Diagnosis not present

## 2023-08-04 DIAGNOSIS — R413 Other amnesia: Secondary | ICD-10-CM | POA: Diagnosis not present

## 2023-08-04 DIAGNOSIS — D61818 Other pancytopenia: Secondary | ICD-10-CM | POA: Diagnosis not present

## 2023-08-04 DIAGNOSIS — I10 Essential (primary) hypertension: Secondary | ICD-10-CM | POA: Diagnosis not present

## 2023-08-04 DIAGNOSIS — D7581 Myelofibrosis: Secondary | ICD-10-CM | POA: Diagnosis not present

## 2023-08-04 DIAGNOSIS — R7309 Other abnormal glucose: Secondary | ICD-10-CM | POA: Diagnosis not present

## 2023-08-15 ENCOUNTER — Telehealth: Payer: Self-pay | Admitting: Hematology and Oncology

## 2023-08-18 ENCOUNTER — Ambulatory Visit: Payer: Medicare HMO | Admitting: Hematology and Oncology

## 2023-08-18 ENCOUNTER — Inpatient Hospital Stay: Payer: Medicare HMO

## 2023-08-18 ENCOUNTER — Other Ambulatory Visit: Payer: Medicare HMO

## 2023-08-23 ENCOUNTER — Inpatient Hospital Stay: Payer: Medicare HMO | Attending: Hematology and Oncology

## 2023-08-23 DIAGNOSIS — D61818 Other pancytopenia: Secondary | ICD-10-CM | POA: Insufficient documentation

## 2023-08-23 DIAGNOSIS — D7581 Myelofibrosis: Secondary | ICD-10-CM

## 2023-08-23 LAB — CBC WITH DIFFERENTIAL (CANCER CENTER ONLY)
Abs Immature Granulocytes: 0.23 10*3/uL — ABNORMAL HIGH (ref 0.00–0.07)
Basophils Absolute: 0 10*3/uL (ref 0.0–0.1)
Basophils Relative: 1 %
Eosinophils Absolute: 0 10*3/uL (ref 0.0–0.5)
Eosinophils Relative: 0 %
HCT: 24.3 % — ABNORMAL LOW (ref 39.0–52.0)
Hemoglobin: 7.4 g/dL — ABNORMAL LOW (ref 13.0–17.0)
Immature Granulocytes: 11 %
Lymphocytes Relative: 17 %
Lymphs Abs: 0.4 10*3/uL — ABNORMAL LOW (ref 0.7–4.0)
MCH: 32 pg (ref 26.0–34.0)
MCHC: 30.5 g/dL (ref 30.0–36.0)
MCV: 105.2 fL — ABNORMAL HIGH (ref 80.0–100.0)
Monocytes Absolute: 0.5 10*3/uL (ref 0.1–1.0)
Monocytes Relative: 21 %
Neutro Abs: 1.1 10*3/uL — ABNORMAL LOW (ref 1.7–7.7)
Neutrophils Relative %: 50 %
Platelet Count: 60 10*3/uL — ABNORMAL LOW (ref 150–400)
RBC: 2.31 MIL/uL — ABNORMAL LOW (ref 4.22–5.81)
RDW: 18.6 % — ABNORMAL HIGH (ref 11.5–15.5)
Smear Review: DECREASED
WBC Count: 2.1 10*3/uL — ABNORMAL LOW (ref 4.0–10.5)
nRBC: 3.3 % — ABNORMAL HIGH (ref 0.0–0.2)

## 2023-08-23 LAB — CMP (CANCER CENTER ONLY)
ALT: 10 U/L (ref 0–44)
AST: 18 U/L (ref 15–41)
Albumin: 3.4 g/dL — ABNORMAL LOW (ref 3.5–5.0)
Alkaline Phosphatase: 46 U/L (ref 38–126)
Anion gap: 4 — ABNORMAL LOW (ref 5–15)
BUN: 12 mg/dL (ref 8–23)
CO2: 29 mmol/L (ref 22–32)
Calcium: 8.4 mg/dL — ABNORMAL LOW (ref 8.9–10.3)
Chloride: 108 mmol/L (ref 98–111)
Creatinine: 0.99 mg/dL (ref 0.61–1.24)
GFR, Estimated: >60 mL/min (ref >60)
Glucose, Bld: 99 mg/dL (ref 70–99)
Potassium: 3.9 mmol/L (ref 3.5–5.1)
Sodium: 141 mmol/L (ref 135–145)
Total Bilirubin: 0.8 mg/dL (ref 0.3–1.2)
Total Protein: 5.8 g/dL — ABNORMAL LOW (ref 6.5–8.1)

## 2023-08-24 ENCOUNTER — Telehealth: Payer: Self-pay | Admitting: *Deleted

## 2023-08-24 ENCOUNTER — Other Ambulatory Visit: Payer: Self-pay | Admitting: *Deleted

## 2023-08-24 DIAGNOSIS — D7581 Myelofibrosis: Secondary | ICD-10-CM

## 2023-08-24 NOTE — Telephone Encounter (Signed)
Received call from pt's wife. She states that her husband had labs done yesterday and his HGb is 7.4 and he is very pale and very fatigued.  Discussed with Dr. Leonides Schanz. He is agreeable to transfusion of 2 units of blood either Thursday or Friday. Pt needs to come tomorrow for Type and screen sample to blood bank. Advised wife of the above. Advised to wait for a bit after he gets his labs done tomorrow so I can let her know when he can get the transfusions. She said she would wait.  High priority scheduling message sent for labs and transfusion.

## 2023-08-25 ENCOUNTER — Inpatient Hospital Stay: Payer: Medicare HMO

## 2023-08-25 ENCOUNTER — Other Ambulatory Visit: Payer: Self-pay | Admitting: *Deleted

## 2023-08-25 ENCOUNTER — Inpatient Hospital Stay (HOSPITAL_BASED_OUTPATIENT_CLINIC_OR_DEPARTMENT_OTHER): Payer: Medicare HMO

## 2023-08-25 DIAGNOSIS — D7581 Myelofibrosis: Secondary | ICD-10-CM

## 2023-08-25 DIAGNOSIS — D61818 Other pancytopenia: Secondary | ICD-10-CM | POA: Diagnosis not present

## 2023-08-25 LAB — SAMPLE TO BLOOD BANK

## 2023-08-25 LAB — PREPARE RBC (CROSSMATCH)

## 2023-08-25 MED ORDER — ACETAMINOPHEN 325 MG PO TABS
650.0000 mg | ORAL_TABLET | Freq: Once | ORAL | Status: AC
  Administered 2023-08-25: 650 mg via ORAL
  Filled 2023-08-25: qty 2

## 2023-08-25 MED ORDER — SODIUM CHLORIDE 0.9% IV SOLUTION
250.0000 mL | Freq: Once | INTRAVENOUS | Status: AC
  Administered 2023-08-25: 250 mL via INTRAVENOUS

## 2023-08-25 NOTE — Patient Instructions (Signed)

## 2023-08-25 NOTE — Progress Notes (Signed)
Per Dr. Leonides Schanz, ok to increase rate of blood transfusion to 329ml/hr.

## 2023-08-26 DIAGNOSIS — R3915 Urgency of urination: Secondary | ICD-10-CM | POA: Diagnosis not present

## 2023-08-26 DIAGNOSIS — R351 Nocturia: Secondary | ICD-10-CM | POA: Diagnosis not present

## 2023-08-26 LAB — BPAM RBC
Blood Product Expiration Date: 202410032359
Blood Product Expiration Date: 202410032359
ISSUE DATE / TIME: 202409051249
ISSUE DATE / TIME: 202409051249
Unit Type and Rh: 5100
Unit Type and Rh: 5100

## 2023-08-26 LAB — TYPE AND SCREEN
ABO/RH(D): O POS
Antibody Screen: NEGATIVE
Unit division: 0
Unit division: 0

## 2023-08-29 DIAGNOSIS — I10 Essential (primary) hypertension: Secondary | ICD-10-CM | POA: Diagnosis not present

## 2023-09-09 DIAGNOSIS — Z23 Encounter for immunization: Secondary | ICD-10-CM | POA: Diagnosis not present

## 2023-09-13 ENCOUNTER — Other Ambulatory Visit (HOSPITAL_BASED_OUTPATIENT_CLINIC_OR_DEPARTMENT_OTHER): Payer: Self-pay

## 2023-09-13 MED ORDER — COVID-19 MRNA VAC-TRIS(PFIZER) 30 MCG/0.3ML IM SUSY
0.3000 mL | PREFILLED_SYRINGE | Freq: Once | INTRAMUSCULAR | 0 refills | Status: AC
  Filled 2023-09-13: qty 0.3, 1d supply, fill #0

## 2023-09-19 ENCOUNTER — Encounter: Payer: Self-pay | Admitting: *Deleted

## 2023-09-20 ENCOUNTER — Telehealth: Payer: Self-pay

## 2023-09-20 NOTE — Telephone Encounter (Signed)
Pt's spouse called regarding a letter that she stated she was expecting to receive for a trip planned. She states that the letter was expected to be in MyChart but she is having trouble locating it. Pt's spouse also states that it needs to be updated for her trip to [place] for July 4th. This RN states that she will let Waynetta Sandy, RN know upon her return tomorrow.   Pt's spouse states that she will come to the cancer center to pick it up upon completion.

## 2023-09-29 ENCOUNTER — Ambulatory Visit (INDEPENDENT_AMBULATORY_CARE_PROVIDER_SITE_OTHER): Payer: Medicare HMO | Admitting: Otolaryngology

## 2023-10-06 DIAGNOSIS — D696 Thrombocytopenia, unspecified: Secondary | ICD-10-CM | POA: Diagnosis not present

## 2023-10-06 DIAGNOSIS — R946 Abnormal results of thyroid function studies: Secondary | ICD-10-CM | POA: Diagnosis not present

## 2023-10-06 DIAGNOSIS — Z23 Encounter for immunization: Secondary | ICD-10-CM | POA: Diagnosis not present

## 2023-10-06 DIAGNOSIS — D61818 Other pancytopenia: Secondary | ICD-10-CM | POA: Diagnosis not present

## 2023-10-06 DIAGNOSIS — Z1331 Encounter for screening for depression: Secondary | ICD-10-CM | POA: Diagnosis not present

## 2023-10-06 DIAGNOSIS — I10 Essential (primary) hypertension: Secondary | ICD-10-CM | POA: Diagnosis not present

## 2023-10-06 DIAGNOSIS — D7581 Myelofibrosis: Secondary | ICD-10-CM | POA: Diagnosis not present

## 2023-10-06 DIAGNOSIS — Z1159 Encounter for screening for other viral diseases: Secondary | ICD-10-CM | POA: Diagnosis not present

## 2023-10-06 DIAGNOSIS — Z0000 Encounter for general adult medical examination without abnormal findings: Secondary | ICD-10-CM | POA: Diagnosis not present

## 2023-10-31 DIAGNOSIS — B354 Tinea corporis: Secondary | ICD-10-CM | POA: Diagnosis not present

## 2023-11-01 ENCOUNTER — Ambulatory Visit: Payer: Medicare HMO | Admitting: Neurology

## 2023-11-01 ENCOUNTER — Encounter: Payer: Self-pay | Admitting: Neurology

## 2023-11-01 VITALS — BP 138/60 | Resp 14 | Ht 71.0 in | Wt 188.0 lb

## 2023-11-01 DIAGNOSIS — G301 Alzheimer's disease with late onset: Secondary | ICD-10-CM | POA: Diagnosis not present

## 2023-11-01 DIAGNOSIS — F02A3 Dementia in other diseases classified elsewhere, mild, with mood disturbance: Secondary | ICD-10-CM | POA: Diagnosis not present

## 2023-11-01 MED ORDER — CITALOPRAM HYDROBROMIDE 10 MG PO TABS: 10.0000 mg | ORAL_TABLET | Freq: Every day | ORAL | 11 refills | Status: DC

## 2023-11-01 NOTE — Addendum Note (Signed)
Addended by: Eather Colas E on: 11/01/2023 02:20 PM   Modules accepted: Orders

## 2023-11-01 NOTE — Progress Notes (Signed)
[facility]  PATIENT: Billy [name] DOB: November 19, 1941  REQUESTING CLINICIAN: Tally Joe, MD HISTORY FROM: Patient/Spouse REASON FOR VISIT: Memory Loss    HISTORICAL  CHIEF COMPLAINT:  Chief Complaint  Patient presents with   New Patient (Initial Visit)    Rm13, wife, memory loss: moca was 22   Discussed the use of AI scribe software for clinical note transcription with the patient, who gave verbal consent to proceed.  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 The patient reports difficulty remembering people's names and needing to look up information he previously knew. He has become less participatory in conversations, often choosing to listen rather than contribute. The patient's spouse corroborates these concerns, noting that the patient has difficulty remembering recent conversations and frequently gets lost while driving, even in familiar areas. The patient's spouse also reports that the patient has forgotten to take his medication on several occasions.  The patient's spouse has noticed an increase in the patient's forgetfulness, particularly with respect to directions and names. The patient has also had instances of forgetting to pay bills on time, resulting in late fees. The patient's spouse has had to assist with navigation and bill payments due to these memory issues.  The patient's spouse also reports that the patient has become more subdued and less interested in conversation, often forgetting what has been said. The patient acknowledges this, stating he often forgets the details of what he reads or watches on television. He also reports difficulty with certain tasks, such as resetting the microwave after a power outage.  The patient has a history of hypertension and increased urination, requiring him to wake up multiple times during the  night. He denies any history of sleep apnea, seizures, stroke, or traumatic brain injury. The patient's family history is significant for memory loss in his mother in her late 67.  The patient's spouse notes that the patient's mood appears to be lower than usual, although they do not believe the patient is depressed. The patient's spouse also mentions that the patient's memory issues have become a source of stress for him, as he has had to take on more responsibilities and provide more assistance to the patient.        TBI:   Few concussion while playing football in high school  Stroke:   no past history of stroke Seizures:   no past history of seizures Sleep:   no history of sleep apnea.  Mood: patient denies anxiety and depression Family history of Dementia: Mother late 22s  Functional status: independent in most ADLs and IADLs Patient lives with spouse. Cooking: some times  Cleaning: no issues  Shopping: no issues Bathing: no issues Toileting: no issues  Driving: Yes, but he is getting lost  Bills: Has been late on the bills  Medications: Yes, but will forget to take  Ever left the stove on by accident?: Denies  Forget how to use items around the house?: Difficulty with resetting the microwaves  Getting lost going to familiar places?: Yes Forgetting loved ones names?: No issues with immediate family  Word finding difficulty? Yes  Sleep: Good    OTHER MEDICAL CONDITIONS: Hypertension, BPH    REVIEW OF SYSTEMS: Full 14 system review of systems performed and negative with exception of: As noted in the HPI   ALLERGIES: Allergies  Allergen Reactions   Aspirin Other (See Comments)  Lowers platelets   Clindamycin Hcl Other (See Comments)    ? Neck swelling    Penicillins Other (See Comments)    Doesn't remember was infant   Ramipril Cough    HOME MEDICATIONS: Outpatient Medications Prior to Visit  Medication Sig Dispense Refill   amLODipine (NORVASC) 5 MG  tablet      fluconazole (DIFLUCAN) 200 MG tablet Take 200 mg by mouth every 7 (seven) days.     metoprolol succinate (TOPROL-XL) 100 MG 24 hr tablet Take 100 mg by mouth daily.      Multiple Vitamins-Minerals (MULTIVITAMIN MEN PO) Take by mouth daily.     tamsulosin (FLOMAX) 0.4 MG CAPS capsule Take 0.4 mg by mouth at bedtime.     losartan-hydrochlorothiazide (HYZAAR) 50-12.5 MG tablet Take 1 tablet by mouth daily. (Patient not taking: Reported on 08/25/2023)     No facility-administered medications prior to visit.    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  2005   ? infected seb cyst    FAMILY HISTORY: Family History  Problem Relation Age of Onset   Heart disease Father    Cancer Brother        bone and liver    SOCIAL HISTORY: Social History   Socioeconomic History   Marital status: Married    Spouse name: Not on file   Number of children: Not on file   Years of education: Not on file   Highest education level: Not on file  Occupational History   Occupation: Chiropractor    Comment: semi-retired 2013  Tobacco Use   Smoking status: Never   Smokeless tobacco: Never  Substance and Sexual Activity   Alcohol use: Yes    Alcohol/week: 0.0 standard drinks of alcohol   Drug use: No   Sexual activity: Not on file  Other Topics Concern   Not on file  Social History Narrative   Not on file   Social Determinants of Health   Financial Resource Strain: Not on file  Food Insecurity: Not on file  Transportation Needs: Not on file  Physical Activity: Not on file  Stress:  Not on file  Social Connections: Not on file  Intimate Partner Violence: Not on file    PHYSICAL EXAM  GENERAL EXAM/CONSTITUTIONAL: Vitals:  Vitals:   11/01/23 1313  BP: 138/60  Resp: 14  Weight: 188 lb (85.3 kg)  Height: 5\' 11"  (1.803 m)   Body mass index is 26.22 kg/m. Wt Readings from Last 3 Encounters:  11/01/23 188 lb (85.3 kg)  05/24/23 186 lb 4.8 oz (84.5 kg)  02/17/23 192 lb 2 oz (87.1 kg)   Patient is in no distress; well developed, nourished and groomed; neck is supple  MUSCULOSKELETAL: Gait, strength, tone, movements noted in Neurologic exam below  NEUROLOGIC: MENTAL STATUS:      No data to display            11/01/2023    1:19 PM  Montreal Cognitive Assessment   Visuospatial/ Executive (0/5) 4  Naming (0/3) 3  Attention: Read list of  digits (0/2) 2  Attention: Read list of letters (0/1) 1  Attention: Serial 7 subtraction starting at 100 (0/3) 3  Language: Repeat phrase (0/2) 1  Language : Fluency (0/1) 1  Abstraction (0/2) 2  Delayed Recall (0/5) 0  Orientation (0/6) 4  Total 21    awake, alert, oriented to person, place and time recent and remote memory intact normal attention and concentration language fluent, comprehension intact, naming intact fund of knowledge appropriate  CRANIAL NERVE:  2nd, 3rd, 4th, 6th- visual fields full to confrontation, extraocular muscles intact, no nystagmus 5th - facial sensation symmetric 7th - facial strength symmetric 8th - hearing intact 9th - palate elevates symmetrically, uvula midline 11th - shoulder shrug symmetric 12th - tongue protrusion midline  MOTOR:  normal bulk and tone, full strength in the BUE, BLE  SENSORY:  normal and symmetric to light touch  COORDINATION:  finger-nose-finger, fine finger movements normal  GAIT/STATION:  normal     DIAGNOSTIC DATA (LABS, IMAGING, TESTING) - I reviewed patient records, labs, notes, testing and imaging myself where available.  Lab  Results  Component Value Date   WBC 2.1 (L) 08/23/2023   HGB 7.4 (L) 08/23/2023   HCT 24.3 (L) 08/23/2023   MCV 105.2 (H) 08/23/2023   PLT 60 (L) 08/23/2023      Component Value Date/Time   NA 141 08/23/2023 1040   NA 143 10/14/2017 1532   NA 142 06/15/2016 1524   K 3.9 08/23/2023 1040   K 4.2 06/15/2016 1524   CL 108 08/23/2023 1040   CL 106 05/31/2013 0920   CO2 29 08/23/2023 1040   CO2 27 06/15/2016 1524   GLUCOSE 99 08/23/2023 1040   GLUCOSE 107 06/15/2016 1524   GLUCOSE 108 (H) 05/31/2013 0920   BUN 12 08/23/2023 1040   BUN 12 10/14/2017 1532   BUN 14.8 06/15/2016 1524   CREATININE 0.99 08/23/2023 1040   CREATININE 1.0 06/15/2016 1524   CALCIUM 8.4 (L) 08/23/2023 1040   CALCIUM 9.2 06/15/2016 1524   PROT 5.8 (L) 08/23/2023 1040   PROT 6.6 10/14/2017 1532   PROT 6.7 06/15/2016 1524   ALBUMIN 3.4 (L) 08/23/2023 1040   ALBUMIN 4.1 10/14/2017 1532   ALBUMIN 3.6 06/15/2016 1524   AST 18 08/23/2023 1040   AST 23 06/15/2016 1524   ALT 10 08/23/2023 1040   ALT 21 06/15/2016 1524   ALKPHOS 46 08/23/2023 1040   ALKPHOS 48 06/15/2016 1524   BILITOT 0.8 08/23/2023 1040   BILITOT 0.50 06/15/2016 1524   GFRNONAA >60 08/23/2023 1040   GFRAA >60 05/23/2019 1356   No results found for: "CHOL", "HDL", "LDLCALC", "LDLDIRECT", "TRIG", "CHOLHDL" No results found for: "HGBA1C" Lab Results  Component Value Date   VITAMINB12 348 03/30/2021   No results found for: "TSH"   Assessment and Plan    Mild Dementia   He presents with a one-year history of memory problems, including difficulty recalling names, recent conversations, and directions, which affects daily activities such as taking medications and handling bills. His MoCA score was 21, indicating cognitive impairment. Differential diagnosis includes Mild Alzheimer's disease, vs.vascular dementia with a family history of memory loss in his mother. We discussed the potential impact of COVID-19 on cognitive function and the mild  depression associated with his memory loss. Alzheimer's disease, accounting for 80-85% of dementia cases, was explained. We will order blood tests for Alzheimer's biomarkers and APOE4 genotype, and a brain MRI to rule out structural abnormalities and large territory stroke. Starting  Celexa 10 mg in the morning will help improve mood. After receiving test results, we will discuss treatment options including Aricept or monoclonal antibodies, such as Leqembi or Kisunla which both slow disease progression but do not stop it.  Hypertension   He is currently taking metoprolol and Amlodipine with no recent complications or changes in condition.   Follow-up   We will schedule a follow-up appointment after receiving blood test and MRI results to discuss treatment options based on diagnostic findings.         1. 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  If positive we will discuss Aricept vs. New monoclonal antibodies such as Leqembi vs. Kisunla MRI brain without contrast Start Celexa 10 mg daily Continue follow-up PCP Return in 1 year or sooner if worse.   There are well-accepted and sensible ways to reduce risk for Alzheimers disease and other degenerative brain disorders .  Exercise Daily Walk A daily 20 minute walk should be part of your routine. Disease related apathy can be a significant roadblock to exercise and the only way to overcome this is to make it a daily routine and perhaps have a reward at the end (something your loved one loves to eat or drink perhaps) or a personal trainer coming to the home can also be very useful. Most importantly, the patient is much more likely to exercise if the caregiver / spouse does it with him/her. In general a structured, repetitive schedule is best.  General Health: Any diseases which effect your body will effect your brain such as a  pneumonia, urinary infection, blood clot, heart attack or stroke. Keep contact with your primary care doctor for regular follow ups.  Sleep. A good nights sleep is healthy for the brain. Seven hours is recommended. If you have insomnia or poor sleep habits we can give you some instructions. If you have sleep apnea wear your mask.  Diet: Eating a heart healthy diet is also a good idea; fish and poultry instead of red meat, nuts (mostly non-peanuts), vegetables, fruits, olive oil or canola oil (instead of butter), minimal salt (use other spices to flavor foods), whole grain rice, bread, cereal and pasta and wine in moderation.Research is now showing that the MIND diet, which is a combination of The Mediterranean diet and the DASH diet, is beneficial for cognitive processing and longevity. Information about this diet can be found in The MIND Diet, a book by Alonna Minium, MS, RDN, and online at [employer]  Finances, Power of [address] and Advance Directives: You should consider putting legal safeguards in place with regard to financial and medical decision making. While the spouse always has power of attorney for medical and financial issues in the absence of any form, you should consider what you want in case the spouse / caregiver is no longer around or capable of making decisions.      Orders Placed This Encounter  Procedures   MR BRAIN WO CONTRAST   ATN PROFILE   APOE Alzheimer's Risk   Vitamin B12   TSH    Meds ordered this encounter  Medications   citalopram (CELEXA) 10 MG tablet    Sig: Take 1 tablet (10 mg total) by mouth daily.    Dispense:  30 tablet    Refill:  11    Return in about 1 year (around 10/31/2024).  I have spent  a total of 65 minutes dedicated to this patient today, preparing to see patient, performing a medically appropriate examination and evaluation, ordering tests and/or medications and procedures, and counseling and educating the  patient/family/caregiver; independently interpreting result and communicating results to the family/patient/caregiver; and documenting clinical information in the electronic medical record.   Windell Norfolk, MD 11/01/2023, 2:07 PM  [facility] [address] [place], [place] [address] [phone]

## 2023-11-01 NOTE — Patient Instructions (Signed)
Dementia lab including ATN profile, APO E4 genotype, TSH and B12.  If positive we will discuss Aricept vs. New monoclonal antibodies such as Leqembi vs. Kisunla MRI brain without contrast Start Celexa 10 mg daily Continue follow-up PCP Return in 1 year or sooner if worse.   There are well-accepted and sensible ways to reduce risk for Alzheimers disease and other degenerative brain disorders .  Exercise Daily Walk A daily 20 minute walk should be part of your routine. Disease related apathy can be a significant roadblock to exercise and the only way to overcome this is to make it a daily routine and perhaps have a reward at the end (something your loved one loves to eat or drink perhaps) or a personal trainer coming to the home can also be very useful. Most importantly, the patient is much more likely to exercise if the caregiver / spouse does it with him/her. In general a structured, repetitive schedule is best.  General Health: Any diseases which effect your body will effect your brain such as a pneumonia, urinary infection, blood clot, heart attack or stroke. Keep contact with your primary care doctor for regular follow ups.  Sleep. A good nights sleep is healthy for the brain. Seven hours is recommended. If you have insomnia or poor sleep habits we can give you some instructions. If you have sleep apnea wear your mask.  Diet: Eating a heart healthy diet is also a good idea; fish and poultry instead of red meat, nuts (mostly non-peanuts), vegetables, fruits, olive oil or canola oil (instead of butter), minimal salt (use other spices to flavor foods), whole grain rice, bread, cereal and pasta and wine in moderation.Research is now showing that the MIND diet, which is a combination of The Mediterranean diet and the DASH diet, is beneficial for cognitive processing and longevity. Information about this diet can be found in The MIND Diet, a book by Alonna Minium, MS, RDN, and online at  [employer]  Finances, Power of [address] and Advance Directives: You should consider putting legal safeguards in place with regard to financial and medical decision making. While the spouse always has power of attorney for medical and financial issues in the absence of any form, you should consider what you want in case the spouse / caregiver is no longer around or capable of making decisions.

## 2023-11-08 ENCOUNTER — Telehealth: Payer: Self-pay | Admitting: Neurology

## 2023-11-08 NOTE — Telephone Encounter (Signed)
Ethlyn Gallery: [id number] exp. 11/08/23-01/07/24 sent to GI [phone]

## 2023-11-12 ENCOUNTER — Telehealth: Payer: Self-pay | Admitting: Hematology and Oncology

## 2023-11-12 LAB — ATN PROFILE
A -- Beta-amyloid 42/40 Ratio: 0.097 — ABNORMAL LOW (ref >0.102)
Beta-amyloid 40: 177.64 pg/mL
Beta-amyloid 42: 17.16 pg/mL
N -- NfL, Plasma: 3.07 pg/mL (ref 0.00–11.55)
T -- p-tau181: 0.61 pg/mL (ref 0.00–0.97)

## 2023-11-12 LAB — APOE ALZHEIMER'S RISK

## 2023-11-12 LAB — VITAMIN B12: Vitamin B-12: 492 pg/mL (ref 232–1245)

## 2023-11-12 LAB — TSH: TSH: 7.91 u[IU]/mL — ABNORMAL HIGH (ref 0.450–4.500)

## 2023-11-14 DIAGNOSIS — R7989 Other specified abnormal findings of blood chemistry: Secondary | ICD-10-CM | POA: Diagnosis not present

## 2023-11-14 DIAGNOSIS — R946 Abnormal results of thyroid function studies: Secondary | ICD-10-CM | POA: Diagnosis not present

## 2023-11-24 ENCOUNTER — Ambulatory Visit: Payer: Medicare HMO | Admitting: Hematology and Oncology

## 2023-11-24 ENCOUNTER — Other Ambulatory Visit: Payer: Self-pay | Admitting: Neurology

## 2023-11-24 ENCOUNTER — Other Ambulatory Visit: Payer: Medicare HMO

## 2023-11-25 DIAGNOSIS — R351 Nocturia: Secondary | ICD-10-CM | POA: Diagnosis not present

## 2023-11-25 DIAGNOSIS — R3915 Urgency of urination: Secondary | ICD-10-CM | POA: Diagnosis not present

## 2023-12-01 ENCOUNTER — Telehealth: Payer: Self-pay | Admitting: Neurology

## 2023-12-01 ENCOUNTER — Inpatient Hospital Stay: Payer: Medicare HMO | Attending: Hematology and Oncology

## 2023-12-01 ENCOUNTER — Encounter: Payer: Self-pay | Admitting: Nurse Practitioner

## 2023-12-01 ENCOUNTER — Inpatient Hospital Stay: Payer: Medicare HMO | Admitting: Nurse Practitioner

## 2023-12-01 VITALS — BP 137/61 | HR 65 | Temp 98.2°F | Resp 17 | Wt 184.9 lb

## 2023-12-01 DIAGNOSIS — D7581 Myelofibrosis: Secondary | ICD-10-CM

## 2023-12-01 DIAGNOSIS — D61818 Other pancytopenia: Secondary | ICD-10-CM | POA: Diagnosis not present

## 2023-12-01 LAB — CMP (CANCER CENTER ONLY)
ALT: 13 U/L (ref 0–44)
AST: 23 U/L (ref 15–41)
Albumin: 3.5 g/dL (ref 3.5–5.0)
Alkaline Phosphatase: 47 U/L (ref 38–126)
Anion gap: 3 — ABNORMAL LOW (ref 5–15)
BUN: 13 mg/dL (ref 8–23)
CO2: 32 mmol/L (ref 22–32)
Calcium: 8.9 mg/dL (ref 8.9–10.3)
Chloride: 107 mmol/L (ref 98–111)
Creatinine: 0.96 mg/dL (ref 0.61–1.24)
GFR, Estimated: >60 mL/min (ref >60)
Glucose, Bld: 96 mg/dL (ref 70–99)
Potassium: 4.1 mmol/L (ref 3.5–5.1)
Sodium: 142 mmol/L (ref 135–145)
Total Bilirubin: 0.9 mg/dL (ref <1.2)
Total Protein: 6 g/dL — ABNORMAL LOW (ref 6.5–8.1)

## 2023-12-01 LAB — CBC WITH DIFFERENTIAL (CANCER CENTER ONLY)
Abs Immature Granulocytes: 0.36 10*3/uL — ABNORMAL HIGH (ref 0.00–0.07)
Basophils Absolute: 0 10*3/uL (ref 0.0–0.1)
Basophils Relative: 1 %
Eosinophils Absolute: 0 10*3/uL (ref 0.0–0.5)
Eosinophils Relative: 1 %
HCT: 27 % — ABNORMAL LOW (ref 39.0–52.0)
Hemoglobin: 8.3 g/dL — ABNORMAL LOW (ref 13.0–17.0)
Immature Granulocytes: 13 %
Lymphocytes Relative: 15 %
Lymphs Abs: 0.4 10*3/uL — ABNORMAL LOW (ref 0.7–4.0)
MCH: 32.5 pg (ref 26.0–34.0)
MCHC: 30.7 g/dL (ref 30.0–36.0)
MCV: 105.9 fL — ABNORMAL HIGH (ref 80.0–100.0)
Monocytes Absolute: 0.6 10*3/uL (ref 0.1–1.0)
Monocytes Relative: 22 %
Neutro Abs: 1.3 10*3/uL — ABNORMAL LOW (ref 1.7–7.7)
Neutrophils Relative %: 48 %
Platelet Count: 56 10*3/uL — ABNORMAL LOW (ref 150–400)
RBC: 2.55 MIL/uL — ABNORMAL LOW (ref 4.22–5.81)
RDW: 18.6 % — ABNORMAL HIGH (ref 11.5–15.5)
WBC Count: 2.7 10*3/uL — ABNORMAL LOW (ref 4.0–10.5)
nRBC: 3.7 % — ABNORMAL HIGH (ref 0.0–0.2)

## 2023-12-01 NOTE — Progress Notes (Signed)
Patient Care Team: Tally Joe, MD as PCP - General (Family Medicine)   CHIEF COMPLAINT: Follow up pancytopenia  CURRENT THERAPY:  Observation and supportive care Transfusion parameters: RBC if Hgb  < 7 - 8 and symptomatic and PLTS if plt < 20   INTERVAL HISTORY Billy [name] returns for follow up, last seen by Dr. Leonides Schanz 05/24/23. He continues observation. Doing well in the interval without changes. Doesn't sleep well at night due to nocturia, adjusting those meds, so he is tired during the day but still does normal activities. Eating and drinking. Gets winded easily which is baseline. Denies recent infection, pain, bruising/bleeding.  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  2005   ? infected seb cyst     Outpatient Encounter Medications as of 12/01/2023  Medication Sig   amLODipine (NORVASC) 5 MG tablet    fluconazole (DIFLUCAN) 200 MG tablet Take 200 mg by mouth every 7 (seven) days.   metoprolol succinate (TOPROL-XL) 100 MG 24 hr tablet Take 100 mg by mouth daily.    Multiple Vitamins-Minerals (MULTIVITAMIN MEN PO) Take by mouth daily.   citalopram (CELEXA) 10 MG tablet TAKE 1 TABLET BY MOUTH EVERY DAY (Patient not taking: Reported on 12/01/2023)   tamsulosin (FLOMAX) 0.4 MG CAPS capsule Take 0.4 mg by mouth at bedtime. (Patient not taking: Reported on 12/01/2023)   No facility-administered encounter medications on file as of 12/01/2023.     Today's Vitals   12/01/23 0958 12/01/23 0959 12/01/23 1005  BP: (!) 145/63 137/61   Pulse: 65    Resp: 17    Temp: 98.2 F  (36.8 C)    TempSrc: Temporal    SpO2: 99%    Weight: 184 lb 14.4 oz (83.9 kg)    PainSc:   0-No pain   Body mass index is 25.79 kg/m.   PHYSICAL EXAM GENERAL:alert, no distress and comfortable SKIN: no ecchymoses or rash  EYES: sclera clear NECK: without mass LUNGS: clear with normal breathing effort HEART: regular rate & rhythm, no lower extremity edema ABDOMEN: abdomen soft, non-tender and normal bowel sounds NEURO: alert & oriented x 3 with fluent speech, no focal motor/sensory deficits   CBC    Component Value Date/Time   WBC 2.7 (L) 12/01/2023 0938   WBC 3.9 (L) 05/24/2017 1440   RBC 2.55 (L) 12/01/2023 0938   HGB 8.3 (L) 12/01/2023 0938   HGB 14.2 10/14/2017 1532   HGB 10.9 (L) 11/30/2016 1520   HCT 27.0 (L) 12/01/2023 0938   HCT 43.0 10/14/2017 1532   HCT 33.9 (L) 11/30/2016 1520   PLT 56 (L) 12/01/2023 0938   PLT 63 (LL) 10/14/2017 1532   MCV 105.9 (H) 12/01/2023 0938   MCV 92 10/14/2017 1532   MCV 92.1 11/30/2016 1520   MCH 32.5 12/01/2023 0938   MCHC 30.7 12/01/2023 0938   RDW 18.6 (H) 12/01/2023 0938   RDW 12.8 10/14/2017 1532   RDW 16.2 (H) 11/30/2016 1520   LYMPHSABS 0.4 (L) 12/01/2023 0938   LYMPHSABS 1.0  10/14/2017 1532   LYMPHSABS 0.7 (L) 11/30/2016 1520   MONOABS 0.6 12/01/2023 0938   MONOABS 0.8 11/30/2016 1520   EOSABS 0.0 12/01/2023 0938   EOSABS 0.0 10/14/2017 1532   BASOSABS 0.0 12/01/2023 0938   BASOSABS 0.0 10/14/2017 1532   BASOSABS 0.0 11/30/2016 1520     CMP     Component Value Date/Time   NA 142 12/01/2023 0938   NA 143 10/14/2017 1532   NA 142 06/15/2016 1524   K 4.1 12/01/2023 0938   K 4.2 06/15/2016 1524   CL 107 12/01/2023 0938   CL 106 05/31/2013 0920   CO2 32 12/01/2023 0938   CO2 27 06/15/2016 1524   GLUCOSE 96 12/01/2023 0938   GLUCOSE 107 06/15/2016 1524   GLUCOSE 108 (H) 05/31/2013 0920   BUN 13 12/01/2023 0938   BUN 12 10/14/2017 1532   BUN 14.8 06/15/2016 1524   CREATININE 0.96 12/01/2023 0938    CREATININE 1.0 06/15/2016 1524   CALCIUM 8.9 12/01/2023 0938   CALCIUM 9.2 06/15/2016 1524   PROT 6.0 (L) 12/01/2023 0938   PROT 6.6 10/14/2017 1532   PROT 6.7 06/15/2016 1524   ALBUMIN 3.5 12/01/2023 0938   ALBUMIN 4.1 10/14/2017 1532   ALBUMIN 3.6 06/15/2016 1524   AST 23 12/01/2023 0938   AST 23 06/15/2016 1524   ALT 13 12/01/2023 0938   ALT 21 06/15/2016 1524   ALKPHOS 47 12/01/2023 0938   ALKPHOS 48 06/15/2016 1524   BILITOT 0.9 12/01/2023 0938   BILITOT 0.50 06/15/2016 1524   GFRNONAA >60 12/01/2023 0938   GFRAA >60 05/23/2019 1356     ASSESSMENT & PLAN: 82 yo male   #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 Cyndie Chime and more recently Dr. 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  -Plts between 40-60k dating back to 2013 -2009 - 2017: treated with thrombopoietin agonist (Promacta or N-plate) by Dr. 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 Yetta Numbers in [place]  3) 03/30/2021: Re-establish care at [facility] with Georga Kaufmann PA-C 4) Pt continues to decline recommendation for repeat bone marrow bx, continues observation  -Billy [name] appears stable. No signs of bleeding, infection, or other change.  -Labs reviewed: Anemia and neutropenia improved and thrombocytopenia slightly worse from 3 months ago.  -He again declined the recommendation for repeat bone marrow biopsy and prefers observation. I recommend lab q2 months, he prefers lab  q3 months.  -Continue supportive care and transfusions as needed -F/up in 6 months, or sooner if needed; he knows to contact us sooner with infection, bleeding, or new concerns   PLAN: -Labs reviewed -No transfusions today -Pt declined repeat bone marrow biopsy -Continue observation -Lab in 3 and 6 months -F/up in 6 months, or sooner if needed    All questions were answered. The patient knows to call the clinic with any problems, questions or concerns. No barriers to learning were detected.   Santiago Glad, NP-C 12/01/2023

## 2023-12-01 NOTE — Telephone Encounter (Signed)
Called and stated

## 2023-12-01 NOTE — Telephone Encounter (Signed)
Pt's wife calling to go over lab results. Requesting call back

## 2023-12-09 ENCOUNTER — Other Ambulatory Visit: Payer: Medicare HMO

## 2023-12-23 ENCOUNTER — Ambulatory Visit
Admission: RE | Admit: 2023-12-23 | Discharge: 2023-12-23 | Disposition: A | Discharge status: Home / Self Care | Payer: Medicare HMO | Source: Ambulatory Visit | Attending: Neurology | Admitting: Neurology

## 2023-12-23 DIAGNOSIS — G301 Alzheimer's disease with late onset: Secondary | ICD-10-CM

## 2023-12-23 DIAGNOSIS — F02A3 Dementia in other diseases classified elsewhere, mild, with mood disturbance: Secondary | ICD-10-CM

## 2023-12-27 ENCOUNTER — Other Ambulatory Visit (HOSPITAL_BASED_OUTPATIENT_CLINIC_OR_DEPARTMENT_OTHER): Payer: Self-pay

## 2023-12-27 ENCOUNTER — Other Ambulatory Visit (HOSPITAL_COMMUNITY): Payer: Self-pay

## 2023-12-27 ENCOUNTER — Other Ambulatory Visit: Payer: Self-pay | Admitting: Neurology

## 2023-12-27 MED ORDER — DONEPEZIL HCL 5 MG PO TABS
5.0000 mg | ORAL_TABLET | Freq: Every day | ORAL | 3 refills | Status: DC
  Filled 2023-12-27: qty 30, 30d supply, fill #0

## 2023-12-27 NOTE — Progress Notes (Signed)
 Please call and inform patient that his Brain MRI did not show any acute abnormality but there is atrophy (shrinking) in the medial temporal lobes. 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 Please inform him that I would like to start him on a Medication called Aricept  to help reduce the progression of the disease. It is taken nightly and side effects include diarrhea, dizziness and vivid dreams.   Dr. Anarie Kalish

## 2023-12-28 ENCOUNTER — Other Ambulatory Visit: Payer: Self-pay | Admitting: Neurology

## 2023-12-29 ENCOUNTER — Other Ambulatory Visit: Payer: Self-pay

## 2024-01-10 ENCOUNTER — Other Ambulatory Visit (HOSPITAL_COMMUNITY): Payer: Self-pay

## 2024-02-29 ENCOUNTER — Inpatient Hospital Stay: Payer: Medicare HMO | Attending: Hematology and Oncology

## 2024-02-29 DIAGNOSIS — D61818 Other pancytopenia: Secondary | ICD-10-CM | POA: Insufficient documentation

## 2024-02-29 DIAGNOSIS — D7581 Myelofibrosis: Secondary | ICD-10-CM

## 2024-02-29 LAB — CBC WITH DIFFERENTIAL (CANCER CENTER ONLY)
Abs Immature Granulocytes: 0.23 10*3/uL — ABNORMAL HIGH (ref 0.00–0.07)
Basophils Absolute: 0 10*3/uL (ref 0.0–0.1)
Basophils Relative: 1 %
Eosinophils Absolute: 0 10*3/uL (ref 0.0–0.5)
Eosinophils Relative: 1 %
HCT: 27.5 % — ABNORMAL LOW (ref 39.0–52.0)
Hemoglobin: 8.7 g/dL — ABNORMAL LOW (ref 13.0–17.0)
Immature Granulocytes: 11 %
Lymphocytes Relative: 21 %
Lymphs Abs: 0.4 10*3/uL — ABNORMAL LOW (ref 0.7–4.0)
MCH: 33 pg (ref 26.0–34.0)
MCHC: 31.6 g/dL (ref 30.0–36.0)
MCV: 104.2 fL — ABNORMAL HIGH (ref 80.0–100.0)
Monocytes Absolute: 0.4 10*3/uL (ref 0.1–1.0)
Monocytes Relative: 21 %
Neutro Abs: 0.9 10*3/uL — ABNORMAL LOW (ref 1.7–7.7)
Neutrophils Relative %: 45 %
Platelet Count: 56 10*3/uL — ABNORMAL LOW (ref 150–400)
RBC: 2.64 MIL/uL — ABNORMAL LOW (ref 4.22–5.81)
RDW: 17.9 % — ABNORMAL HIGH (ref 11.5–15.5)
WBC Count: 2.1 10*3/uL — ABNORMAL LOW (ref 4.0–10.5)
nRBC: 3.9 % — ABNORMAL HIGH (ref 0.0–0.2)

## 2024-02-29 LAB — CMP (CANCER CENTER ONLY)
ALT: 13 U/L (ref 0–44)
AST: 23 U/L (ref 15–41)
Albumin: 3.5 g/dL (ref 3.5–5.0)
Alkaline Phosphatase: 47 U/L (ref 38–126)
Anion gap: 3 — ABNORMAL LOW (ref 5–15)
BUN: 14 mg/dL (ref 8–23)
CO2: 31 mmol/L (ref 22–32)
Calcium: 8.4 mg/dL — ABNORMAL LOW (ref 8.9–10.3)
Chloride: 108 mmol/L (ref 98–111)
Creatinine: 1.06 mg/dL (ref 0.61–1.24)
GFR, Estimated: >60 mL/min
Glucose, Bld: 100 mg/dL — ABNORMAL HIGH (ref 70–99)
Potassium: 4.3 mmol/L (ref 3.5–5.1)
Sodium: 142 mmol/L (ref 135–145)
Total Bilirubin: 0.9 mg/dL (ref 0.0–1.2)
Total Protein: 5.7 g/dL — ABNORMAL LOW (ref 6.5–8.1)

## 2024-05-25 DIAGNOSIS — R351 Nocturia: Secondary | ICD-10-CM | POA: Diagnosis not present

## 2024-05-25 DIAGNOSIS — R3915 Urgency of urination: Secondary | ICD-10-CM | POA: Diagnosis not present

## 2024-05-31 ENCOUNTER — Inpatient Hospital Stay: Payer: Medicare HMO | Attending: Hematology and Oncology

## 2024-05-31 ENCOUNTER — Other Ambulatory Visit: Payer: Self-pay | Admitting: Hematology and Oncology

## 2024-05-31 ENCOUNTER — Inpatient Hospital Stay: Payer: Medicare HMO | Admitting: Hematology and Oncology

## 2024-05-31 VITALS — BP 152/55 | HR 57 | Temp 97.4°F | Resp 15 | Wt 185.7 lb

## 2024-05-31 DIAGNOSIS — D7581 Myelofibrosis: Secondary | ICD-10-CM | POA: Diagnosis not present

## 2024-05-31 DIAGNOSIS — D61818 Other pancytopenia: Secondary | ICD-10-CM | POA: Diagnosis not present

## 2024-05-31 DIAGNOSIS — R0602 Shortness of breath: Secondary | ICD-10-CM | POA: Diagnosis not present

## 2024-05-31 DIAGNOSIS — R5383 Other fatigue: Secondary | ICD-10-CM | POA: Diagnosis not present

## 2024-05-31 DIAGNOSIS — D649 Anemia, unspecified: Secondary | ICD-10-CM | POA: Diagnosis not present

## 2024-05-31 LAB — CBC WITH DIFFERENTIAL (CANCER CENTER ONLY)
Abs Immature Granulocytes: 0.25 10*3/uL — ABNORMAL HIGH (ref 0.00–0.07)
Basophils Absolute: 0 10*3/uL (ref 0.0–0.1)
Basophils Relative: 1 %
Eosinophils Absolute: 0 10*3/uL (ref 0.0–0.5)
Eosinophils Relative: 1 %
HCT: 24.9 % — ABNORMAL LOW (ref 39.0–52.0)
Hemoglobin: 7.8 g/dL — ABNORMAL LOW (ref 13.0–17.0)
Immature Granulocytes: 11 %
Lymphocytes Relative: 17 %
Lymphs Abs: 0.4 10*3/uL — ABNORMAL LOW (ref 0.7–4.0)
MCH: 33.3 pg (ref 26.0–34.0)
MCHC: 31.3 g/dL (ref 30.0–36.0)
MCV: 106.4 fL — ABNORMAL HIGH (ref 80.0–100.0)
Monocytes Absolute: 0.5 10*3/uL (ref 0.1–1.0)
Monocytes Relative: 23 %
Neutro Abs: 1 10*3/uL — ABNORMAL LOW (ref 1.7–7.7)
Neutrophils Relative %: 47 %
Platelet Count: 52 10*3/uL — ABNORMAL LOW (ref 150–400)
RBC: 2.34 MIL/uL — ABNORMAL LOW (ref 4.22–5.81)
RDW: 17.9 % — ABNORMAL HIGH (ref 11.5–15.5)
WBC Count: 2.2 10*3/uL — ABNORMAL LOW (ref 4.0–10.5)
nRBC: 3.7 % — ABNORMAL HIGH (ref 0.0–0.2)

## 2024-05-31 LAB — CMP (CANCER CENTER ONLY)
ALT: 13 U/L (ref 0–44)
AST: 24 U/L (ref 15–41)
Albumin: 2.9 g/dL — ABNORMAL LOW (ref 3.5–5.0)
Alkaline Phosphatase: 43 U/L (ref 38–126)
Anion gap: 9 (ref 5–15)
BUN: 14 mg/dL (ref 8–23)
CO2: 26 mmol/L (ref 22–32)
Calcium: 8.5 mg/dL — ABNORMAL LOW (ref 8.9–10.3)
Chloride: 107 mmol/L (ref 98–111)
Creatinine: 1.02 mg/dL (ref 0.61–1.24)
GFR, Estimated: >60 mL/min (ref >60)
Glucose, Bld: 114 mg/dL — ABNORMAL HIGH (ref 70–99)
Potassium: 4.3 mmol/L (ref 3.5–5.1)
Sodium: 142 mmol/L (ref 135–145)
Total Bilirubin: 1.1 mg/dL (ref 0.0–1.2)
Total Protein: 5.6 g/dL — ABNORMAL LOW (ref 6.5–8.1)

## 2024-05-31 LAB — LACTATE DEHYDROGENASE: LDH: 503 U/L — ABNORMAL HIGH (ref 98–192)

## 2024-05-31 NOTE — Progress Notes (Signed)
 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 Freddie and more recently Dr. 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  -Plts between 40-60k dating back to 2013 -2009 - 2017: treated with thrombopoietin agonist (Promacta  or N-plate) by Dr. 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 Avery in [place]   2)Labs from PCP, [name] PA-C -02/07/2019: WBC 3.7, Hgb 14.0, MCV 91.8, Plt 68. -02/27/2020: WBC 2.2, Hgb 13.2, MCV 91.1, Plt 53 -03/23/2021: WBC 2.4 (L), Hgb 9.8 (L), MCV 96.4 (H), Plt count not reported due to clumping.   3) 03/30/2021: Re-establish care at [facility] with [name] PA-C  HISTORY OF PRESENTING ILLNESS:  Billy [name] 83 y.o. [name] returns for a follow up visit for pancytopenia. He is unaccompanied for this visit. His last visit was on 12/01/2023 and in the interim, patient denies any changes to his health.   On exam today, Billy [name] reports that he has been having increasing shortness of breath and has difficulty walking more than 20 to 30 feet at a time.  He reports he used to walk a mile every day and now his breathing is holding him back.  He is not having any lightheadedness or dizziness.  He reports he would like handicap placard  today.  He reports he is had no trouble with bleeding, bruising, or dark stools.  He reports that he has strong appetite and otherwise good energy.  He likes to walk for exercise.  He is had no recent illnesses such as runny nose, sore throat, cough.  He denies any fevers, chills, sweats.  He is not having any overt signs of bleeding such as nosebleeds, gum bleeding, or dark stools.  A full 10 point ROS is otherwise negative.  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  2005   ? infected seb cyst    SOCIAL HISTORY: Social History   Socioeconomic History   Marital status: Married    Spouse name: Not on file   Number of children: Not on file   Years of education: Not on file   Highest education level: Not on file  Occupational History   Occupation: Chiropractor    Comment: semi-retired 2013  Tobacco Use   Smoking status: Never   Smokeless tobacco: Never  Substance and Sexual Activity   Alcohol use: Yes    Alcohol/week: 0.0 standard drinks of alcohol   Drug use: No   Sexual activity: Not on file  Other Topics Concern   Not  on file  Social History Narrative   Not on file   Social Drivers of Health   Financial Resource Strain: Not on file  Food Insecurity: Food Insecurity Present (05/31/2024)   Hunger Vital Sign    Worried About Running Out of Food in the Last Year: Sometimes true    Ran Out of Food in the Last Year: Never true  Transportation Needs: No Transportation Needs (05/31/2024)   PRAPARE - Administrator, Civil Service (Medical): No    Lack of Transportation  (Non-Medical): No  Physical Activity: Not on file  Stress: Not on file  Social Connections: Not on file  Intimate Partner Violence: Not At Risk (05/31/2024)   Humiliation, Afraid, Rape, and Kick questionnaire    Fear of Current or Ex-Partner: No    Emotionally Abused: No    Physically Abused: No    Sexually Abused: No    FAMILY HISTORY: Family History  Problem Relation Age of Onset   Heart disease Father    Cancer Brother        bone and liver    ALLERGIES:  is allergic to aspirin, celexa  [citalopram ], clindamycin hcl, myrbetriq [mirabegron er], penicillins, and ramipril.  MEDICATIONS:  Current Outpatient Medications  Medication Sig Dispense Refill   amLODipine (NORVASC) 5 MG tablet      fluconazole (DIFLUCAN) 200 MG tablet Take 200 mg by mouth every 7 (seven) days.     metoprolol succinate (TOPROL-XL) 100 MG 24 hr tablet Take 100 mg by mouth daily.      Multiple Vitamins-Minerals (MULTIVITAMIN MEN PO) Take by mouth daily.     No current facility-administered medications for this visit.    REVIEW OF SYSTEMS:   Constitutional: ( - ) fevers, ( - )  chills , ( - ) night sweats Eyes: ( - ) blurriness of vision, ( - ) double vision, ( - ) watery eyes Ears, nose, mouth, throat, and face: ( - ) mucositis, ( - ) sore throat Respiratory: ( - ) cough, ( - ) dyspnea, ( - ) wheezes Cardiovascular: ( - ) palpitation, ( - ) chest discomfort, ( - ) lower extremity swelling Gastrointestinal:  ( - ) nausea, ( - ) heartburn, ( - ) change in bowel habits Skin: ( - ) abnormal skin rashes Lymphatics: ( - ) new lymphadenopathy, ( - ) easy bruising Neurological: ( - ) numbness, ( - ) tingling, ( - ) new weaknesses Behavioral/Psych: ( - ) mood change, ( - ) new changes  All other systems were reviewed with the patient and are negative.  PHYSICAL EXAMINATION: ECOG PERFORMANCE STATUS: 1 - Symptomatic but completely ambulatory  Vitals:   05/31/24 1156  BP: (!) 152/55  Pulse: (!) 57  Resp: 15   Temp: (!) 97.4 F (36.3 C)  SpO2: 97%      Filed Weights   05/31/24 1156  Weight: 185 lb 11.2 oz (84.2 kg)       GENERAL: well appearing [name] in NAD  SKIN: skin color, texture, turgor are normal, no rashes or significant lesions EYES: conjunctiva are pink and non-injected, sclera clear OROPHARYNX: no exudate, no erythema; lips, buccal mucosa, and tongue normal  LYMPH:  no palpable lymphadenopathy in the cervical or supraclavicular lymph nodes.  LUNGS: clear to auscultation and percussion with normal breathing effort HEART: regular rate & rhythm and no murmurs and no lower extremity edema ABDOMEN: soft, non-tender, non-distended, normal bowel sounds Musculoskeletal: no cyanosis of digits and no clubbing  PSYCH:  alert & oriented x 3, fluent speech NEURO: no focal motor/sensory deficits  LABORATORY DATA:  I have reviewed the data as listed    Latest Ref Rng & Units 05/31/2024   11:21 AM 02/29/2024   11:25 AM 12/01/2023    9:38 AM  CBC  WBC 4.0 - 10.5 K/uL 2.2  2.1  2.7   Hemoglobin 13.0 - 17.0 g/dL 7.8  8.7  8.3   Hematocrit 39.0 - 52.0 % 24.9  27.5  27.0   Platelets 150 - 400 K/uL 52  56  56        Latest Ref Rng & Units 05/31/2024   11:21 AM 02/29/2024   11:25 AM 12/01/2023    9:38 AM  CMP  Glucose 70 - 99 mg/dL 885  899  96   BUN 8 - 23 mg/dL 14  14  13    Creatinine 0.61 - 1.24 mg/dL 8.97  8.93  9.03   Sodium 135 - 145 mmol/L 142  142  142   Potassium 3.5 - 5.1 mmol/L 4.3  4.3  4.1   Chloride 98 - 111 mmol/L 107  108  107   CO2 22 - 32 mmol/L 26  31  32   Calcium 8.9 - 10.3 mg/dL 8.5  8.4  8.9   Total Protein 6.5 - 8.1 g/dL 5.6  5.7  6.0   Total Bilirubin 0.0 - 1.2 mg/dL 1.1  0.9  0.9   Alkaline Phos 38 - 126 U/L 43  47  47   AST 15 - 41 U/L 24  23  23    ALT 0 - 44 U/L 13  13  13       PATHOLOGY: Bone marrow biopsy on 11/04/2016:   Cytogenetic Analysis on 11/04/2016 was normal with no observable clonal chromosomal abnormalities.  ASSESSMENT &  PLAN Billy [name] is a 83 y.o. [name] presenting to the clinic for pancytopenia with MPL mutation.    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  --Strict return precautions for bleeding. Explained that patient's fatigue and SOB likely secondary to progressive anemia.  -- Discussed importance of bone marrow biopsy to further evaluate worsening counts. Differentials include worsening myelofibrosis versus hematologic malignancy. Patient continues to decline our recommendation and would like to continue to monitor.  --Supportive care includes transfusion for hemoglobin less than 7 or platelets less than 20 Plan:  --labs today show WBC 2.2, Hgb 7.8, MCV 106.4, Plt 52 --RTC in 4 months with repeat labs in 2 months.   No orders of the defined types were placed in this encounter.  All questions were answered. The patient knows to call the clinic with any problems, questions or concerns.  I have spent a total of 30 minutes minutes of face-to-face and non-face-to-face time, preparing to see the patient, performing a medically appropriate examination, counseling and educating the patient, ordering medications/tests/procedures, documenting clinical information in the electronic health record, and care coordination.   Norleen IVAR Kidney, MD Department of Hematology/Oncology [facility] Phone: [phone] Pager: [phone] Email: [email or web address]

## 2024-06-25 ENCOUNTER — Inpatient Hospital Stay: Attending: Hematology and Oncology

## 2024-06-25 ENCOUNTER — Other Ambulatory Visit: Payer: Self-pay | Admitting: Hematology and Oncology

## 2024-06-25 DIAGNOSIS — D61818 Other pancytopenia: Secondary | ICD-10-CM | POA: Insufficient documentation

## 2024-06-25 DIAGNOSIS — D649 Anemia, unspecified: Secondary | ICD-10-CM | POA: Insufficient documentation

## 2024-06-25 DIAGNOSIS — D7581 Myelofibrosis: Secondary | ICD-10-CM

## 2024-06-25 LAB — CMP (CANCER CENTER ONLY)
ALT: 11 U/L (ref 0–44)
AST: 21 U/L (ref 15–41)
Albumin: 3.2 g/dL — ABNORMAL LOW (ref 3.5–5.0)
Alkaline Phosphatase: 40 U/L (ref 38–126)
Anion gap: 1 — ABNORMAL LOW (ref 5–15)
BUN: 12 mg/dL (ref 8–23)
CO2: 32 mmol/L (ref 22–32)
Calcium: 8.5 mg/dL — ABNORMAL LOW (ref 8.9–10.3)
Chloride: 110 mmol/L (ref 98–111)
Creatinine: 0.97 mg/dL (ref 0.61–1.24)
GFR, Estimated: >60 mL/min (ref >60)
Glucose, Bld: 107 mg/dL — ABNORMAL HIGH (ref 70–99)
Potassium: 4 mmol/L (ref 3.5–5.1)
Sodium: 143 mmol/L (ref 135–145)
Total Bilirubin: 0.8 mg/dL (ref 0.0–1.2)
Total Protein: 5.4 g/dL — ABNORMAL LOW (ref 6.5–8.1)

## 2024-06-25 LAB — RETIC PANEL
Immature Retic Fract: 25.6 % — ABNORMAL HIGH (ref 2.3–15.9)
RBC.: 2.25 MIL/uL — ABNORMAL LOW (ref 4.22–5.81)
Retic Count, Absolute: 73.8 K/uL (ref 19.0–186.0)
Retic Ct Pct: 3.3 % — ABNORMAL HIGH (ref 0.4–3.1)
Reticulocyte Hemoglobin: 32 pg (ref >27.9)

## 2024-06-25 LAB — CBC WITH DIFFERENTIAL (CANCER CENTER ONLY)
Abs Immature Granulocytes: 0.27 K/uL — ABNORMAL HIGH (ref 0.00–0.07)
Basophils Absolute: 0 K/uL (ref 0.0–0.1)
Basophils Relative: 1 %
Eosinophils Absolute: 0 K/uL (ref 0.0–0.5)
Eosinophils Relative: 1 %
HCT: 23.7 % — ABNORMAL LOW (ref 39.0–52.0)
Hemoglobin: 7.5 g/dL — ABNORMAL LOW (ref 13.0–17.0)
Immature Granulocytes: 14 %
Lymphocytes Relative: 15 %
Lymphs Abs: 0.3 K/uL — ABNORMAL LOW (ref 0.7–4.0)
MCH: 33.6 pg (ref 26.0–34.0)
MCHC: 31.6 g/dL (ref 30.0–36.0)
MCV: 106.3 fL — ABNORMAL HIGH (ref 80.0–100.0)
Monocytes Absolute: 0.5 K/uL (ref 0.1–1.0)
Monocytes Relative: 24 %
Neutro Abs: 0.9 K/uL — ABNORMAL LOW (ref 1.7–7.7)
Neutrophils Relative %: 45 %
Platelet Count: 52 K/uL — ABNORMAL LOW (ref 150–400)
RBC: 2.23 MIL/uL — ABNORMAL LOW (ref 4.22–5.81)
RDW: 17.5 % — ABNORMAL HIGH (ref 11.5–15.5)
WBC Count: 1.9 K/uL — ABNORMAL LOW (ref 4.0–10.5)
nRBC: 4.8 % — ABNORMAL HIGH (ref 0.0–0.2)

## 2024-06-26 LAB — FERRITIN: Ferritin: 281 ng/mL (ref 24–336)

## 2024-06-26 LAB — ERYTHROPOIETIN: Erythropoietin: 292.8 m[IU]/mL — ABNORMAL HIGH (ref 2.6–18.5)

## 2024-06-27 ENCOUNTER — Encounter: Payer: Self-pay | Admitting: Hematology and Oncology

## 2024-06-28 ENCOUNTER — Telehealth: Payer: Self-pay | Admitting: Hematology and Oncology

## 2024-06-28 ENCOUNTER — Other Ambulatory Visit: Payer: Self-pay | Admitting: *Deleted

## 2024-06-28 DIAGNOSIS — D7581 Myelofibrosis: Secondary | ICD-10-CM

## 2024-06-28 DIAGNOSIS — D61818 Other pancytopenia: Secondary | ICD-10-CM

## 2024-06-29 ENCOUNTER — Inpatient Hospital Stay

## 2024-06-29 DIAGNOSIS — D61818 Other pancytopenia: Secondary | ICD-10-CM | POA: Diagnosis not present

## 2024-06-29 DIAGNOSIS — D7581 Myelofibrosis: Secondary | ICD-10-CM

## 2024-06-29 DIAGNOSIS — D649 Anemia, unspecified: Secondary | ICD-10-CM | POA: Diagnosis not present

## 2024-06-29 LAB — CBC WITH DIFFERENTIAL (CANCER CENTER ONLY)
Abs Immature Granulocytes: 0.3 K/uL — ABNORMAL HIGH (ref 0.00–0.07)
Band Neutrophils: 4 %
Basophils Absolute: 0 K/uL (ref 0.0–0.1)
Basophils Relative: 0 %
Eosinophils Absolute: 0 K/uL (ref 0.0–0.5)
Eosinophils Relative: 0 %
HCT: 22.9 % — ABNORMAL LOW (ref 39.0–52.0)
Hemoglobin: 7.2 g/dL — ABNORMAL LOW (ref 13.0–17.0)
Lymphocytes Relative: 22 %
Lymphs Abs: 0.4 K/uL — ABNORMAL LOW (ref 0.7–4.0)
MCH: 33.5 pg (ref 26.0–34.0)
MCHC: 31.4 g/dL (ref 30.0–36.0)
MCV: 106.5 fL — ABNORMAL HIGH (ref 80.0–100.0)
Metamyelocytes Relative: 14 %
Monocytes Absolute: 0.5 K/uL (ref 0.1–1.0)
Monocytes Relative: 24 %
Neutro Abs: 0.8 K/uL — ABNORMAL LOW (ref 1.7–7.7)
Neutrophils Relative %: 36 %
Platelet Count: 51 K/uL — ABNORMAL LOW (ref 150–400)
RBC: 2.15 MIL/uL — ABNORMAL LOW (ref 4.22–5.81)
RDW: 17.9 % — ABNORMAL HIGH (ref 11.5–15.5)
WBC Count: 1.9 K/uL — ABNORMAL LOW (ref 4.0–10.5)
nRBC: 5.3 % — ABNORMAL HIGH (ref 0.0–0.2)

## 2024-06-29 LAB — SAMPLE TO BLOOD BANK

## 2024-06-29 NOTE — Progress Notes (Signed)
 Patient denies feeling tired or weak.  Per Dr. Federico, OK to discharge patient today with Hgb 7.2, Plt 51, without receiving blood products.  VSS at discharge.  Ambulated to lobby.

## 2024-07-13 ENCOUNTER — Inpatient Hospital Stay

## 2024-07-13 ENCOUNTER — Other Ambulatory Visit: Payer: Self-pay | Admitting: Hematology and Oncology

## 2024-07-13 DIAGNOSIS — D7581 Myelofibrosis: Secondary | ICD-10-CM

## 2024-07-13 DIAGNOSIS — D61818 Other pancytopenia: Secondary | ICD-10-CM | POA: Diagnosis not present

## 2024-07-13 DIAGNOSIS — D649 Anemia, unspecified: Secondary | ICD-10-CM | POA: Diagnosis not present

## 2024-07-13 LAB — CBC WITH DIFFERENTIAL (CANCER CENTER ONLY)
Abs Immature Granulocytes: 0.3 K/uL — ABNORMAL HIGH (ref 0.00–0.07)
Band Neutrophils: 2 %
Basophils Absolute: 0 K/uL (ref 0.0–0.1)
Basophils Relative: 1 %
Eosinophils Absolute: 0 K/uL (ref 0.0–0.5)
Eosinophils Relative: 0 %
HCT: 22.1 % — ABNORMAL LOW (ref 39.0–52.0)
Hemoglobin: 7.1 g/dL — ABNORMAL LOW (ref 13.0–17.0)
Lymphocytes Relative: 26 %
Lymphs Abs: 0.5 K/uL — ABNORMAL LOW (ref 0.7–4.0)
MCH: 34.6 pg — ABNORMAL HIGH (ref 26.0–34.0)
MCHC: 32.1 g/dL (ref 30.0–36.0)
MCV: 107.8 fL — ABNORMAL HIGH (ref 80.0–100.0)
Metamyelocytes Relative: 11 %
Monocytes Absolute: 0.5 K/uL (ref 0.1–1.0)
Monocytes Relative: 30 %
Myelocytes: 3 %
Neutro Abs: 0.5 K/uL — ABNORMAL LOW (ref 1.7–7.7)
Neutrophils Relative %: 27 %
Platelet Count: 49 K/uL — ABNORMAL LOW (ref 150–400)
RBC: 2.05 MIL/uL — ABNORMAL LOW (ref 4.22–5.81)
RDW: 18.3 % — ABNORMAL HIGH (ref 11.5–15.5)
WBC Count: 1.8 K/uL — ABNORMAL LOW (ref 4.0–10.5)
nRBC: 4.4 % — ABNORMAL HIGH (ref 0.0–0.2)

## 2024-07-13 LAB — CMP (CANCER CENTER ONLY)
ALT: 11 U/L (ref 0–44)
AST: 23 U/L (ref 15–41)
Albumin: 3.2 g/dL — ABNORMAL LOW (ref 3.5–5.0)
Alkaline Phosphatase: 50 U/L (ref 38–126)
Anion gap: 1 — ABNORMAL LOW (ref 5–15)
BUN: 14 mg/dL (ref 8–23)
CO2: 32 mmol/L (ref 22–32)
Calcium: 8.6 mg/dL — ABNORMAL LOW (ref 8.9–10.3)
Chloride: 108 mmol/L (ref 98–111)
Creatinine: 1.06 mg/dL (ref 0.61–1.24)
GFR, Estimated: >60 mL/min (ref >60)
Glucose, Bld: 92 mg/dL (ref 70–99)
Potassium: 4.6 mmol/L (ref 3.5–5.1)
Sodium: 141 mmol/L (ref 135–145)
Total Bilirubin: 0.9 mg/dL (ref 0.0–1.2)
Total Protein: 5.5 g/dL — ABNORMAL LOW (ref 6.5–8.1)

## 2024-07-13 LAB — SAMPLE TO BLOOD BANK

## 2024-07-13 NOTE — Progress Notes (Signed)
 BP (!) 154/72 (BP Location: Left Arm, Patient Position: Sitting)   Pulse 60   Temp 97.8 F (36.6 C) (Oral)   Resp 18   SpO2 100%   Patient very insistent that he does not want blood. Patient spoke to [facility] personally and communication with Dr. Federico informed the entire team that patient refusing transfusion.   Patient ambulatory to the lobby.

## 2024-07-26 ENCOUNTER — Telehealth: Payer: Self-pay | Admitting: *Deleted

## 2024-07-26 ENCOUNTER — Inpatient Hospital Stay: Attending: Hematology and Oncology

## 2024-07-26 ENCOUNTER — Other Ambulatory Visit: Payer: Self-pay | Admitting: Hematology and Oncology

## 2024-07-26 DIAGNOSIS — D61818 Other pancytopenia: Secondary | ICD-10-CM | POA: Diagnosis not present

## 2024-07-26 DIAGNOSIS — D7581 Myelofibrosis: Secondary | ICD-10-CM

## 2024-07-26 LAB — CMP (CANCER CENTER ONLY)
ALT: 12 U/L (ref 0–44)
AST: 22 U/L (ref 15–41)
Albumin: 3.5 g/dL (ref 3.5–5.0)
Alkaline Phosphatase: 49 U/L (ref 38–126)
Anion gap: 1 — ABNORMAL LOW (ref 5–15)
BUN: 12 mg/dL (ref 8–23)
CO2: 33 mmol/L — ABNORMAL HIGH (ref 22–32)
Calcium: 8.5 mg/dL — ABNORMAL LOW (ref 8.9–10.3)
Chloride: 107 mmol/L (ref 98–111)
Creatinine: 1.07 mg/dL (ref 0.61–1.24)
GFR, Estimated: >60 mL/min (ref >60)
Glucose, Bld: 98 mg/dL (ref 70–99)
Potassium: 4.1 mmol/L (ref 3.5–5.1)
Sodium: 141 mmol/L (ref 135–145)
Total Bilirubin: 0.8 mg/dL (ref 0.0–1.2)
Total Protein: 5.9 g/dL — ABNORMAL LOW (ref 6.5–8.1)

## 2024-07-26 LAB — CBC WITH DIFFERENTIAL (CANCER CENTER ONLY)
Abs Immature Granulocytes: 0.24 K/uL — ABNORMAL HIGH (ref 0.00–0.07)
Basophils Absolute: 0 K/uL (ref 0.0–0.1)
Basophils Relative: 1 %
Eosinophils Absolute: 0 K/uL (ref 0.0–0.5)
Eosinophils Relative: 1 %
HCT: 24.5 % — ABNORMAL LOW (ref 39.0–52.0)
Hemoglobin: 7.8 g/dL — ABNORMAL LOW (ref 13.0–17.0)
Immature Granulocytes: 12 %
Lymphocytes Relative: 17 %
Lymphs Abs: 0.3 K/uL — ABNORMAL LOW (ref 0.7–4.0)
MCH: 33.9 pg (ref 26.0–34.0)
MCHC: 31.8 g/dL (ref 30.0–36.0)
MCV: 106.5 fL — ABNORMAL HIGH (ref 80.0–100.0)
Monocytes Absolute: 0.4 K/uL (ref 0.1–1.0)
Monocytes Relative: 19 %
Neutro Abs: 1 K/uL — ABNORMAL LOW (ref 1.7–7.7)
Neutrophils Relative %: 50 %
Platelet Count: 54 K/uL — ABNORMAL LOW (ref 150–400)
RBC: 2.3 MIL/uL — ABNORMAL LOW (ref 4.22–5.81)
RDW: 18 % — ABNORMAL HIGH (ref 11.5–15.5)
Smear Review: NORMAL
WBC Count: 2 K/uL — ABNORMAL LOW (ref 4.0–10.5)
nRBC: 3.6 % — ABNORMAL HIGH (ref 0.0–0.2)

## 2024-07-26 LAB — SAMPLE TO BLOOD BANK

## 2024-07-26 LAB — LACTATE DEHYDROGENASE: LDH: 565 U/L — ABNORMAL HIGH (ref 98–192)

## 2024-07-26 NOTE — Telephone Encounter (Signed)
 TCT patient regarding lab results from today. Spoke with him. Advised of HGB results of 7.8. Asked pt if he felt like he needed a transfusion. Pt states he feels well. Denies fatigue, SOB. Able to take care of himself. He denied need for transfusion today. Will need repeat labs in 4 weeks per Dr. Federico

## 2024-08-15 DIAGNOSIS — M6289 Other specified disorders of muscle: Secondary | ICD-10-CM | POA: Diagnosis not present

## 2024-08-15 DIAGNOSIS — M6281 Muscle weakness (generalized): Secondary | ICD-10-CM | POA: Diagnosis not present

## 2024-08-15 DIAGNOSIS — R3915 Urgency of urination: Secondary | ICD-10-CM | POA: Diagnosis not present

## 2024-08-15 DIAGNOSIS — R351 Nocturia: Secondary | ICD-10-CM | POA: Diagnosis not present

## 2024-08-22 ENCOUNTER — Inpatient Hospital Stay: Attending: Hematology and Oncology

## 2024-08-22 DIAGNOSIS — D61818 Other pancytopenia: Secondary | ICD-10-CM | POA: Diagnosis not present

## 2024-08-22 DIAGNOSIS — D7581 Myelofibrosis: Secondary | ICD-10-CM

## 2024-08-22 LAB — CBC WITH DIFFERENTIAL (CANCER CENTER ONLY)
Abs Immature Granulocytes: 0.3 K/uL — ABNORMAL HIGH (ref 0.00–0.07)
Band Neutrophils: 3 %
Basophils Absolute: 0 K/uL (ref 0.0–0.1)
Basophils Relative: 0 %
Eosinophils Absolute: 0 K/uL (ref 0.0–0.5)
Eosinophils Relative: 1 %
HCT: 25.4 % — ABNORMAL LOW (ref 39.0–52.0)
Hemoglobin: 7.9 g/dL — ABNORMAL LOW (ref 13.0–17.0)
Lymphocytes Relative: 22 %
Lymphs Abs: 0.4 K/uL — ABNORMAL LOW (ref 0.7–4.0)
MCH: 33.6 pg (ref 26.0–34.0)
MCHC: 31.1 g/dL (ref 30.0–36.0)
MCV: 108.1 fL — ABNORMAL HIGH (ref 80.0–100.0)
Metamyelocytes Relative: 12 %
Monocytes Absolute: 0.6 K/uL (ref 0.1–1.0)
Monocytes Relative: 28 %
Myelocytes: 5 %
Neutro Abs: 0.6 K/uL — ABNORMAL LOW (ref 1.7–7.7)
Neutrophils Relative %: 29 %
Platelet Count: 51 K/uL — ABNORMAL LOW (ref 150–400)
RBC: 2.35 MIL/uL — ABNORMAL LOW (ref 4.22–5.81)
RDW: 18.2 % — ABNORMAL HIGH (ref 11.5–15.5)
WBC Count: 2 K/uL — ABNORMAL LOW (ref 4.0–10.5)
nRBC: 5.9 % — ABNORMAL HIGH (ref 0.0–0.2)

## 2024-08-22 LAB — CMP (CANCER CENTER ONLY)
ALT: 12 U/L (ref 0–44)
AST: 23 U/L (ref 15–41)
Albumin: 3.3 g/dL — ABNORMAL LOW (ref 3.5–5.0)
Alkaline Phosphatase: 46 U/L (ref 38–126)
Anion gap: 3 — ABNORMAL LOW (ref 5–15)
BUN: 16 mg/dL (ref 8–23)
CO2: 31 mmol/L (ref 22–32)
Calcium: 8.5 mg/dL — ABNORMAL LOW (ref 8.9–10.3)
Chloride: 109 mmol/L (ref 98–111)
Creatinine: 1.1 mg/dL (ref 0.61–1.24)
GFR, Estimated: >60 mL/min (ref >60)
Glucose, Bld: 102 mg/dL — ABNORMAL HIGH (ref 70–99)
Potassium: 4.2 mmol/L (ref 3.5–5.1)
Sodium: 143 mmol/L (ref 135–145)
Total Bilirubin: 0.9 mg/dL (ref 0.0–1.2)
Total Protein: 5.7 g/dL — ABNORMAL LOW (ref 6.5–8.1)

## 2024-08-22 LAB — SAMPLE TO BLOOD BANK

## 2024-08-22 LAB — LACTATE DEHYDROGENASE: LDH: 601 U/L — ABNORMAL HIGH (ref 98–192)

## 2024-08-31 ENCOUNTER — Other Ambulatory Visit (HOSPITAL_BASED_OUTPATIENT_CLINIC_OR_DEPARTMENT_OTHER): Payer: Self-pay

## 2024-08-31 MED ORDER — COMIRNATY 30 MCG/0.3ML IM SUSY
0.3000 mL | PREFILLED_SYRINGE | Freq: Once | INTRAMUSCULAR | 0 refills | Status: AC
  Filled 2024-09-03: qty 0.3, 1d supply, fill #0

## 2024-09-03 ENCOUNTER — Other Ambulatory Visit (HOSPITAL_BASED_OUTPATIENT_CLINIC_OR_DEPARTMENT_OTHER): Payer: Self-pay

## 2024-09-10 ENCOUNTER — Other Ambulatory Visit (HOSPITAL_BASED_OUTPATIENT_CLINIC_OR_DEPARTMENT_OTHER): Payer: Self-pay

## 2024-09-13 DIAGNOSIS — Z23 Encounter for immunization: Secondary | ICD-10-CM | POA: Diagnosis not present

## 2024-09-18 ENCOUNTER — Other Ambulatory Visit: Payer: Self-pay | Admitting: Physician Assistant

## 2024-09-18 DIAGNOSIS — D61818 Other pancytopenia: Secondary | ICD-10-CM

## 2024-09-18 NOTE — Progress Notes (Unsigned)
 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 Freddie and more recently Dr. 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  -Plts between 40-60k dating back to 2013 -2009 - 2017: treated with thrombopoietin agonist (Promacta  or N-plate) by Dr. 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 Avery in [place]   2)Labs from PCP, [name] PA-C -02/07/2019: WBC 3.7, Hgb 14.0, MCV 91.8, Plt 68. -02/27/2020: WBC 2.2, Hgb 13.2, MCV 91.1, Plt 53 -03/23/2021: WBC 2.4 (L), Hgb 9.8 (L), MCV 96.4 (H), Plt count not reported due to clumping.   3) 03/30/2021: Re-establish care at [facility] with [name] PA-C  HISTORY OF PRESENTING ILLNESS:  Billy [name] 83 y.o. male returns for a follow up visit for pancytopenia. He is unaccompanied for this visit. His last visit was on 05/31/2024 and in the interim, patient denies any changes to his health.   On exam today, Billy [name] reports***.  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  2005   ? infected seb cyst    SOCIAL HISTORY: Social History   Socioeconomic History   Marital status: Married    Spouse name: Not on file   Number of children: Not on file   Years of education: Not on file   Highest education level: Not on file  Occupational History   Occupation: Chiropractor    Comment: semi-retired 2013  Tobacco Use   Smoking status: Never   Smokeless tobacco: Never  Substance and Sexual Activity   Alcohol use: Yes    Alcohol/week: 0.0 standard drinks of alcohol   Drug use: No   Sexual activity: Not on file  Other Topics Concern   Not on file  Social History Narrative   Not on file   Social Drivers of Health   Financial Resource Strain: Not on file  Food Insecurity: Food Insecurity Present (05/31/2024)   Hunger Vital Sign    Worried About Running Out of Food in the Last Year: Sometimes true    Ran Out of Food in the Last Year: Never true  Transportation Needs: No Transportation Needs (05/31/2024)   PRAPARE - Administrator, Civil Service (Medical): No    Lack of Transportation (Non-Medical): No  Physical Activity: Not on file  Stress: Not on file  Social Connections: Not on file  Intimate Partner Violence: Not At Risk (05/31/2024)   Humiliation, Afraid, Rape, and Kick questionnaire    Fear of Current or Ex-Partner: No    Emotionally Abused: No  Physically Abused: No    Sexually Abused: No    FAMILY HISTORY: Family History  Problem Relation Age of Onset   Heart disease Father    Cancer Brother        bone and liver    ALLERGIES:  is allergic to aspirin, celexa  [citalopram ], clindamycin hcl, myrbetriq [mirabegron er], penicillins, and ramipril.  MEDICATIONS:  Current Outpatient Medications  Medication Sig Dispense Refill   amLODipine (NORVASC) 5 MG tablet      fluconazole  (DIFLUCAN) 200 MG tablet Take 200 mg by mouth every 7 (seven) days.     metoprolol succinate (TOPROL-XL) 100 MG 24 hr tablet Take 100 mg by mouth daily.      Multiple Vitamins-Minerals (MULTIVITAMIN MEN PO) Take by mouth daily.     No current facility-administered medications for this visit.    REVIEW OF SYSTEMS:   Constitutional: ( - ) fevers, ( - )  chills , ( - ) night sweats Eyes: ( - ) blurriness of vision, ( - ) double vision, ( - ) watery eyes Ears, nose, mouth, throat, and face: ( - ) mucositis, ( - ) sore throat Respiratory: ( - ) cough, ( - ) dyspnea, ( - ) wheezes Cardiovascular: ( - ) palpitation, ( - ) chest discomfort, ( - ) lower extremity swelling Gastrointestinal:  ( - ) nausea, ( - ) heartburn, ( - ) change in bowel habits Skin: ( - ) abnormal skin rashes Lymphatics: ( - ) new lymphadenopathy, ( - ) easy bruising Neurological: ( - ) numbness, ( - ) tingling, ( - ) new weaknesses Behavioral/Psych: ( - ) mood change, ( - ) new changes  All other systems were reviewed with the patient and are negative.  PHYSICAL EXAMINATION: ECOG PERFORMANCE STATUS: 1 - Symptomatic but completely ambulatory  There were no vitals filed for this visit.     There were no vitals filed for this visit.      GENERAL: well appearing male in NAD  SKIN: skin color, texture, turgor are normal, no rashes or significant lesions EYES: conjunctiva are pink and non-injected, sclera clear OROPHARYNX: no exudate, no erythema; lips, buccal mucosa, and tongue normal  LYMPH:  no palpable lymphadenopathy in the cervical or supraclavicular lymph nodes.  LUNGS: clear to auscultation and percussion with normal breathing effort HEART: regular rate & rhythm and no murmurs and no lower extremity edema ABDOMEN: soft, non-tender, non-distended, normal bowel sounds Musculoskeletal: no cyanosis of digits and no clubbing  PSYCH: alert & oriented x 3, fluent speech NEURO: no focal motor/sensory  deficits  LABORATORY DATA:  I have reviewed the data as listed    Latest Ref Rng & Units 08/22/2024   11:36 AM 07/26/2024   10:52 AM 07/13/2024    1:50 PM  CBC  WBC 4.0 - 10.5 K/uL 2.0  2.0  1.8   Hemoglobin 13.0 - 17.0 g/dL 7.9  7.8  7.1   Hematocrit 39.0 - 52.0 % 25.4  24.5  22.1   Platelets 150 - 400 K/uL 51  54  49        Latest Ref Rng & Units 08/22/2024   11:36 AM 07/26/2024   10:52 AM 07/13/2024    1:50 PM  CMP  Glucose 70 - 99 mg/dL 897  98  92   BUN 8 - 23 mg/dL 16  12  14    Creatinine 0.61 - 1.24 mg/dL 8.89  8.92  8.93   Sodium 135 - 145 mmol/L 143  141  141   Potassium 3.5 - 5.1 mmol/L 4.2  4.1  4.6   Chloride 98 - 111 mmol/L 109  107  108   CO2 22 - 32 mmol/L 31  33  32   Calcium 8.9 - 10.3 mg/dL 8.5  8.5  8.6   Total Protein 6.5 - 8.1 g/dL 5.7  5.9  5.5   Total Bilirubin 0.0 - 1.2 mg/dL 0.9  0.8  0.9   Alkaline Phos 38 - 126 U/L 46  49  50   AST 15 - 41 U/L 23  22  23    ALT 0 - 44 U/L 12  12  11       PATHOLOGY: Bone marrow biopsy on 11/04/2016:   Cytogenetic Analysis on 11/04/2016 was normal with no observable clonal chromosomal abnormalities.  ASSESSMENT & PLAN Billy [name] is a 83 y.o. male presenting to the clinic for pancytopenia with MPL mutation.    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  --Strict return precautions for bleeding. Explained that patient's fatigue and SOB likely secondary to progressive anemia.  -- Discussed importance of bone marrow biopsy to further evaluate worsening counts. Differentials include worsening myelofibrosis versus hematologic malignancy. Patient continues to decline our recommendation and would like to continue to monitor.  --Supportive care includes transfusion for hemoglobin less than 7 or platelets less than 20 Plan:  --labs today show *** --RTC in 4 months with repeat labs  in 2 months.   No orders of the defined types were placed in this encounter.  All questions were answered. The patient knows to call the clinic with any problems, questions or concerns.  I have spent a total of 30 minutes minutes of face-to-face and non-face-to-face time, preparing to see the patient, performing a medically appropriate examination, counseling and educating the patient, ordering medications/tests/procedures, documenting clinical information in the electronic health record, and care coordination.   [name] PA-C Dept of Hematology and Oncology [facility] Phone: [phone]

## 2024-09-19 ENCOUNTER — Inpatient Hospital Stay: Admitting: Physician Assistant

## 2024-09-19 ENCOUNTER — Inpatient Hospital Stay: Attending: Hematology and Oncology

## 2024-09-19 VITALS — BP 139/67 | HR 61 | Temp 97.5°F | Resp 15 | Wt 183.0 lb

## 2024-09-19 DIAGNOSIS — D61818 Other pancytopenia: Secondary | ICD-10-CM | POA: Diagnosis present

## 2024-09-19 DIAGNOSIS — Z79899 Other long term (current) drug therapy: Secondary | ICD-10-CM | POA: Diagnosis not present

## 2024-09-19 DIAGNOSIS — D7581 Myelofibrosis: Secondary | ICD-10-CM | POA: Diagnosis not present

## 2024-09-19 DIAGNOSIS — Z808 Family history of malignant neoplasm of other organs or systems: Secondary | ICD-10-CM | POA: Diagnosis not present

## 2024-09-19 LAB — CBC WITH DIFFERENTIAL (CANCER CENTER ONLY)
Abs Immature Granulocytes: 0.37 K/uL — ABNORMAL HIGH (ref 0.00–0.07)
Basophils Absolute: 0 K/uL (ref 0.0–0.1)
Basophils Relative: 1 %
Eosinophils Absolute: 0 K/uL (ref 0.0–0.5)
Eosinophils Relative: 0 %
HCT: 26.4 % — ABNORMAL LOW (ref 39.0–52.0)
Hemoglobin: 8.4 g/dL — ABNORMAL LOW (ref 13.0–17.0)
Immature Granulocytes: 14 %
Lymphocytes Relative: 15 %
Lymphs Abs: 0.4 K/uL — ABNORMAL LOW (ref 0.7–4.0)
MCH: 34 pg (ref 26.0–34.0)
MCHC: 31.8 g/dL (ref 30.0–36.0)
MCV: 106.9 fL — ABNORMAL HIGH (ref 80.0–100.0)
Monocytes Absolute: 0.6 K/uL (ref 0.1–1.0)
Monocytes Relative: 21 %
Neutro Abs: 1.2 K/uL — ABNORMAL LOW (ref 1.7–7.7)
Neutrophils Relative %: 49 %
Platelet Count: 54 K/uL — ABNORMAL LOW (ref 150–400)
RBC: 2.47 MIL/uL — ABNORMAL LOW (ref 4.22–5.81)
RDW: 17.8 % — ABNORMAL HIGH (ref 11.5–15.5)
WBC Count: 2.6 K/uL — ABNORMAL LOW (ref 4.0–10.5)
nRBC: 4.3 % — ABNORMAL HIGH (ref 0.0–0.2)

## 2024-09-19 LAB — LACTATE DEHYDROGENASE: LDH: 596 U/L — ABNORMAL HIGH (ref 98–192)

## 2024-09-19 LAB — CMP (CANCER CENTER ONLY)
ALT: 12 U/L (ref 0–44)
AST: 24 U/L (ref 15–41)
Albumin: 3.4 g/dL — ABNORMAL LOW (ref 3.5–5.0)
Alkaline Phosphatase: 47 U/L (ref 38–126)
Anion gap: 1 — ABNORMAL LOW (ref 5–15)
BUN: 12 mg/dL (ref 8–23)
CO2: 32 mmol/L (ref 22–32)
Calcium: 8.3 mg/dL — ABNORMAL LOW (ref 8.9–10.3)
Chloride: 108 mmol/L (ref 98–111)
Creatinine: 1.11 mg/dL (ref 0.61–1.24)
GFR, Estimated: >60 mL/min (ref >60)
Glucose, Bld: 99 mg/dL (ref 70–99)
Potassium: 4.5 mmol/L (ref 3.5–5.1)
Sodium: 141 mmol/L (ref 135–145)
Total Bilirubin: 0.9 mg/dL (ref 0.0–1.2)
Total Protein: 5.8 g/dL — ABNORMAL LOW (ref 6.5–8.1)

## 2024-09-19 LAB — SAMPLE TO BLOOD BANK

## 2024-09-20 ENCOUNTER — Telehealth: Payer: Self-pay | Admitting: Hematology and Oncology

## 2024-09-20 DIAGNOSIS — F02A0 Dementia in other diseases classified elsewhere, mild, without behavioral disturbance, psychotic disturbance, mood disturbance, and anxiety: Secondary | ICD-10-CM | POA: Diagnosis not present

## 2024-09-20 DIAGNOSIS — R946 Abnormal results of thyroid function studies: Secondary | ICD-10-CM | POA: Diagnosis not present

## 2024-09-20 DIAGNOSIS — N3281 Overactive bladder: Secondary | ICD-10-CM | POA: Diagnosis not present

## 2024-09-20 DIAGNOSIS — I10 Essential (primary) hypertension: Secondary | ICD-10-CM | POA: Diagnosis not present

## 2024-09-20 DIAGNOSIS — D696 Thrombocytopenia, unspecified: Secondary | ICD-10-CM | POA: Diagnosis not present

## 2024-09-20 DIAGNOSIS — Z0000 Encounter for general adult medical examination without abnormal findings: Secondary | ICD-10-CM | POA: Diagnosis not present

## 2024-09-20 DIAGNOSIS — G301 Alzheimer's disease with late onset: Secondary | ICD-10-CM | POA: Diagnosis not present

## 2024-09-20 DIAGNOSIS — D7581 Myelofibrosis: Secondary | ICD-10-CM | POA: Diagnosis not present

## 2024-09-20 DIAGNOSIS — D61818 Other pancytopenia: Secondary | ICD-10-CM | POA: Diagnosis not present

## 2024-09-20 NOTE — Telephone Encounter (Signed)
 Left the patient a voicemail with the scheduled appointment details per LOS notes.

## 2024-09-26 ENCOUNTER — Other Ambulatory Visit (HOSPITAL_BASED_OUTPATIENT_CLINIC_OR_DEPARTMENT_OTHER): Payer: Self-pay

## 2024-10-29 ENCOUNTER — Telehealth: Payer: Self-pay | Admitting: Neurology

## 2024-10-29 DIAGNOSIS — E039 Hypothyroidism, unspecified: Secondary | ICD-10-CM | POA: Diagnosis not present

## 2024-10-29 NOTE — Telephone Encounter (Signed)
 Patient's wife said patient refuses to come to appointment and do not want to do what the physician says. Would like to cancel appointment

## 2024-10-31 ENCOUNTER — Ambulatory Visit: Payer: Medicare HMO | Admitting: Neurology

## 2024-11-21 ENCOUNTER — Inpatient Hospital Stay

## 2024-11-21 ENCOUNTER — Inpatient Hospital Stay: Attending: Hematology and Oncology

## 2024-11-21 DIAGNOSIS — D61818 Other pancytopenia: Secondary | ICD-10-CM | POA: Diagnosis present

## 2024-11-21 DIAGNOSIS — D7581 Myelofibrosis: Secondary | ICD-10-CM

## 2024-11-21 LAB — CMP (CANCER CENTER ONLY)
ALT: 12 U/L (ref 0–44)
AST: 34 U/L (ref 15–41)
Albumin: 3.6 g/dL (ref 3.5–5.0)
Alkaline Phosphatase: 57 U/L (ref 38–126)
Anion gap: 7 (ref 5–15)
BUN: 14 mg/dL (ref 8–23)
CO2: 28 mmol/L (ref 22–32)
Calcium: 8.5 mg/dL — ABNORMAL LOW (ref 8.9–10.3)
Chloride: 107 mmol/L (ref 98–111)
Creatinine: 1.04 mg/dL (ref 0.61–1.24)
GFR, Estimated: >60 mL/min (ref >60)
Glucose, Bld: 102 mg/dL — ABNORMAL HIGH (ref 70–99)
Potassium: 4.1 mmol/L (ref 3.5–5.1)
Sodium: 142 mmol/L (ref 135–145)
Total Bilirubin: 0.8 mg/dL (ref 0.0–1.2)
Total Protein: 6 g/dL — ABNORMAL LOW (ref 6.5–8.1)

## 2024-11-21 LAB — CBC WITH DIFFERENTIAL (CANCER CENTER ONLY)
Abs Immature Granulocytes: 0.38 K/uL — ABNORMAL HIGH (ref 0.00–0.07)
Basophils Absolute: 0 K/uL (ref 0.0–0.1)
Basophils Relative: 1 %
Eosinophils Absolute: 0 K/uL (ref 0.0–0.5)
Eosinophils Relative: 0 %
HCT: 25.9 % — ABNORMAL LOW (ref 39.0–52.0)
Hemoglobin: 8.2 g/dL — ABNORMAL LOW (ref 13.0–17.0)
Immature Granulocytes: 16 %
Lymphocytes Relative: 15 %
Lymphs Abs: 0.4 K/uL — ABNORMAL LOW (ref 0.7–4.0)
MCH: 34.2 pg — ABNORMAL HIGH (ref 26.0–34.0)
MCHC: 31.7 g/dL (ref 30.0–36.0)
MCV: 107.9 fL — ABNORMAL HIGH (ref 80.0–100.0)
Monocytes Absolute: 0.5 K/uL (ref 0.1–1.0)
Monocytes Relative: 21 %
Neutro Abs: 1.1 K/uL — ABNORMAL LOW (ref 1.7–7.7)
Neutrophils Relative %: 47 %
Platelet Count: 51 K/uL — ABNORMAL LOW (ref 150–400)
RBC: 2.4 MIL/uL — ABNORMAL LOW (ref 4.22–5.81)
RDW: 17.4 % — ABNORMAL HIGH (ref 11.5–15.5)
WBC Count: 2.4 K/uL — ABNORMAL LOW (ref 4.0–10.5)
nRBC: 4.1 % — ABNORMAL HIGH (ref 0.0–0.2)

## 2024-11-21 LAB — SAMPLE TO BLOOD BANK

## 2024-11-21 LAB — LACTATE DEHYDROGENASE: LDH: 685 U/L — ABNORMAL HIGH (ref 105–235)

## 2025-01-22 ENCOUNTER — Other Ambulatory Visit: Payer: Self-pay | Admitting: Physician Assistant

## 2025-01-22 DIAGNOSIS — D61818 Other pancytopenia: Secondary | ICD-10-CM

## 2025-01-22 NOTE — Progress Notes (Unsigned)
 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 Freddie and more recently Dr. 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  -Plts between 40-60k dating back to 2013 -2009 - 2017: treated with thrombopoietin agonist (Promacta  or N-plate) by Dr. 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 Avery in [place]   2)Labs from PCP, Billy Mantel PA-C -02/07/2019: WBC 3.7, Hgb 14.0, MCV 91.8, Plt 68. -02/27/2020: WBC 2.2, Hgb 13.2, MCV 91.1, Plt 53 -03/23/2021: WBC 2.4 (L), Hgb 9.8 (L), MCV 96.4 (H), Plt count not reported due to clumping.   3) 03/30/2021: Re-establish care at [facility] with Billy Police PA-C  HISTORY OF PRESENTING ILLNESS:  Billy [name] 84 y.o. male returns for a follow up visit for pancytopenia. He is unaccompanied for this visit. His last visit was on 09/19/2024 and in the interim, patient denies any changes to his health.   On exam today, Billy [name] reports he is doing well without any changes to his health. His energy is stable and he reports he can do his baseline daily activities on his own. He denies any appetite or weight changes. He denies nausea, vomiting or bowel habit changes. He denies overt signs of bleeding including hematochezia or  melena. He denies fevers, chills, sweats, shortness of breath, chest pain or cough. Rest  of the ROS is below.   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  2005   ? infected seb cyst    SOCIAL HISTORY: Social History   Socioeconomic History   Marital status: Married    Spouse name: Not on file   Number of children: Not on file   Years of education: Not on file   Highest education level: Not on file  Occupational History   Occupation: Chiropractor    Comment: semi-retired 2013  Tobacco Use   Smoking status: Never   Smokeless tobacco: Never  Substance and Sexual Activity   Alcohol use: Yes    Alcohol/week: 0.0 standard drinks of alcohol   Drug use: No   Sexual activity: Not on file  Other Topics Concern   Not on file  Social History Narrative   Not on file   Social Drivers of Health   Tobacco Use: Low Risk (12/01/2023)   Patient History    Smoking Tobacco Use: Never    Smokeless Tobacco Use: Never    Passive Exposure: Not on file  Financial Resource Strain: Not on file  Food Insecurity: Food Insecurity Present (05/31/2024)   Epic  Worried About Programme Researcher, Broadcasting/film/video in the Last Year: Sometimes true    [employer] of Food in the Last Year: Never true  Transportation Needs: No Transportation Needs (05/31/2024)   Epic    Lack of Transportation (Medical): No    Lack of Transportation (Non-Medical): No  Physical Activity: Not on file  Stress: Not on file  Social Connections: Not on file  Intimate Partner Violence: Not At Risk (05/31/2024)   Epic    Fear of Current or Ex-Partner: No     Emotionally Abused: No    Physically Abused: No    Sexually Abused: No  Depression (PHQ2-9): Low Risk (06/29/2024)   Depression (PHQ2-9)    PHQ-2 Score: 2  Alcohol Screen: Not on file  Housing: Unknown (05/31/2024)   Epic    Unable to Pay for Housing in the Last Year: No    Number of Times Moved in the Last Year: Not on file    Homeless in the Last Year: No  Utilities: Not At Risk (05/31/2024)   Epic    Threatened with loss of utilities: No  Health Literacy: Not on file    FAMILY HISTORY: Family History  Problem Relation Age of Onset   Heart disease Father    Cancer Brother        bone and liver    ALLERGIES:  is allergic to aspirin, celexa  [citalopram ], clindamycin hcl, myrbetriq [mirabegron er], penicillins, and ramipril.  MEDICATIONS:  Current Outpatient Medications  Medication Sig Dispense Refill   amLODipine (NORVASC) 5 MG tablet      fluconazole (DIFLUCAN) 200 MG tablet Take 200 mg by mouth every 7 (seven) days.     metoprolol succinate (TOPROL-XL) 100 MG 24 hr tablet Take 100 mg by mouth daily.      Multiple Vitamins-Minerals (MULTIVITAMIN MEN PO) Take by mouth daily.     No current facility-administered medications for this visit.    REVIEW OF SYSTEMS:   Constitutional: ( - ) fevers, ( - )  chills , ( - ) night sweats Eyes: ( - ) blurriness of vision, ( - ) [name] vision, ( - ) watery eyes Ears, nose, mouth, throat, and face: ( - ) mucositis, ( - ) sore throat Respiratory: ( - ) cough, ( - ) dyspnea, ( - ) wheezes Cardiovascular: ( - ) palpitation, ( - ) chest discomfort, ( - ) lower extremity swelling Gastrointestinal:  ( - ) nausea, ( - ) heartburn, ( - ) change in bowel habits Skin: ( - ) abnormal skin rashes Lymphatics: ( - ) new lymphadenopathy, ( - ) easy bruising Neurological: ( - ) numbness, ( - ) tingling, ( - ) new weaknesses Behavioral/Psych: ( - ) mood change, ( - ) new changes  All other systems were reviewed with the patient and are  negative.  PHYSICAL EXAMINATION: ECOG PERFORMANCE STATUS: 1 - Symptomatic but completely ambulatory  Vitals:   01/23/25 1130  BP: (!) 133/45  Pulse: 69  Resp: 18  Temp: (!) 97 F (36.1 C)  SpO2: 97%        Filed Weights   01/23/25 1130  Weight: 183 lb 9.6 oz (83.3 kg)     GENERAL: well appearing male in NAD  SKIN: skin color, texture, turgor are normal, no rashes or significant lesions EYES: conjunctiva are pink and non-injected, sclera clear LUNGS: clear to auscultation and percussion with normal breathing effort HEART: regular rate & rhythm and no murmurs and no lower extremity edema Musculoskeletal:  no cyanosis of digits and no clubbing  PSYCH: alert & oriented x 3, fluent speech NEURO: no focal motor/sensory deficits  LABORATORY DATA:  I have reviewed the data as listed    Latest Ref Rng & Units 01/23/2025   11:08 AM 11/21/2024   11:02 AM 09/19/2024   10:43 AM  CBC  WBC 4.0 - 10.5 K/uL 2.0  2.4  2.6   Hemoglobin 13.0 - 17.0 g/dL 7.5  8.2  8.4   Hematocrit 39.0 - 52.0 % 23.3  25.9  26.4   Platelets 150 - 400 K/uL 46  51  54        Latest Ref Rng & Units 01/23/2025   11:08 AM 11/21/2024   11:02 AM 09/19/2024   10:43 AM  CMP  Glucose 70 - 99 mg/dL 869  897  99   BUN 8 - 23 mg/dL 14  14  12    Creatinine 0.61 - 1.24 mg/dL 8.94  8.95  8.88   Sodium 135 - 145 mmol/L 140  142  141   Potassium 3.5 - 5.1 mmol/L 4.5  4.1  4.5   Chloride 98 - 111 mmol/L 105  107  108   CO2 22 - 32 mmol/L 27  28  32   Calcium 8.9 - 10.3 mg/dL 8.6  8.5  8.3   Total Protein 6.5 - 8.1 g/dL 5.9  6.0  5.8   Total Bilirubin 0.0 - 1.2 mg/dL 0.7  0.8  0.9   Alkaline Phos 38 - 126 U/L 58  57  47   AST 15 - 41 U/L 31  34  24   ALT 0 - 44 U/L 11  12  12       PATHOLOGY: Bone marrow biopsy on 11/04/2016:   Cytogenetic Analysis on 11/04/2016 was normal with no observable clonal chromosomal abnormalities.  ASSESSMENT & PLAN Billy [name] is a 84 y.o. male presenting to the clinic for  pancytopenia with MPL mutation.    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  --Strict return precautions for bleeding. Explained that patient's fatigue and SOB likely secondary to progressive anemia.  -- Discussed importance of bone marrow biopsy to further evaluate worsening counts. Differentials include worsening myelofibrosis versus hematologic malignancy. Patient continues to decline our recommendation and would like to continue to monitor.  --Supportive care includes transfusion for hemoglobin less than 7 or platelets less than 20 Plan:  --labs today show WBC 2.0, Hgb 7.5, MCV 110.4, Plt 46K,  --No indication for transfusion support today. --Continue with observation. --RTC in 4 months with repeat labs in 2 months.   No orders of the defined types were placed in this encounter.  All questions were answered. The patient knows to call the clinic with any problems, questions or concerns.  I have spent a total of 25 minutes minutes of face-to-face and non-face-to-face time, preparing to see the patient, performing a medically appropriate examination, counseling and educating the patient, documenting clinical information in the electronic health record, and care coordination.   Billy Police PA-C Dept of Hematology and Oncology [facility] Phone: [phone]  "

## 2025-01-23 ENCOUNTER — Inpatient Hospital Stay: Admitting: Physician Assistant

## 2025-01-23 ENCOUNTER — Inpatient Hospital Stay: Attending: Hematology and Oncology

## 2025-01-23 ENCOUNTER — Ambulatory Visit: Admitting: Physician Assistant

## 2025-01-23 ENCOUNTER — Other Ambulatory Visit

## 2025-01-23 VITALS — BP 133/45 | HR 69 | Temp 97.0°F | Resp 18 | Wt 183.6 lb

## 2025-01-23 DIAGNOSIS — D61818 Other pancytopenia: Secondary | ICD-10-CM

## 2025-01-23 DIAGNOSIS — D7581 Myelofibrosis: Secondary | ICD-10-CM | POA: Diagnosis not present

## 2025-01-23 LAB — CBC WITH DIFFERENTIAL (CANCER CENTER ONLY)
Abs Immature Granulocytes: 0.4 10*3/uL — ABNORMAL HIGH (ref 0.00–0.07)
Basophils Absolute: 0 10*3/uL (ref 0.0–0.1)
Basophils Relative: 0 %
Eosinophils Absolute: 0 10*3/uL (ref 0.0–0.5)
Eosinophils Relative: 0 %
HCT: 23.3 % — ABNORMAL LOW (ref 39.0–52.0)
Hemoglobin: 7.5 g/dL — ABNORMAL LOW (ref 13.0–17.0)
Lymphocytes Relative: 23 %
Lymphs Abs: 0.5 10*3/uL — ABNORMAL LOW (ref 0.7–4.0)
MCH: 35.5 pg — ABNORMAL HIGH (ref 26.0–34.0)
MCHC: 32.2 g/dL (ref 30.0–36.0)
MCV: 110.4 fL — ABNORMAL HIGH (ref 80.0–100.0)
Metamyelocytes Relative: 14 %
Monocytes Absolute: 0.5 10*3/uL (ref 0.1–1.0)
Monocytes Relative: 27 %
Myelocytes: 4 %
Neutro Abs: 0.6 10*3/uL — ABNORMAL LOW (ref 1.7–7.7)
Neutrophils Relative %: 32 %
Platelet Count: 46 10*3/uL — ABNORMAL LOW (ref 150–400)
RBC: 2.11 MIL/uL — ABNORMAL LOW (ref 4.22–5.81)
RDW: 17.7 % — ABNORMAL HIGH (ref 11.5–15.5)
WBC Count: 2 10*3/uL — ABNORMAL LOW (ref 4.0–10.5)
nRBC: 6 % — ABNORMAL HIGH (ref 0.0–0.2)

## 2025-01-23 LAB — CMP (CANCER CENTER ONLY)
ALT: 11 U/L (ref 0–44)
AST: 31 U/L (ref 15–41)
Albumin: 3.4 g/dL — ABNORMAL LOW (ref 3.5–5.0)
Alkaline Phosphatase: 58 U/L (ref 38–126)
Anion gap: 7 (ref 5–15)
BUN: 14 mg/dL (ref 8–23)
CO2: 27 mmol/L (ref 22–32)
Calcium: 8.6 mg/dL — ABNORMAL LOW (ref 8.9–10.3)
Chloride: 105 mmol/L (ref 98–111)
Creatinine: 1.05 mg/dL (ref 0.61–1.24)
GFR, Estimated: >60 mL/min
Glucose, Bld: 130 mg/dL — ABNORMAL HIGH (ref 70–99)
Potassium: 4.5 mmol/L (ref 3.5–5.1)
Sodium: 140 mmol/L (ref 135–145)
Total Bilirubin: 0.7 mg/dL (ref 0.0–1.2)
Total Protein: 5.9 g/dL — ABNORMAL LOW (ref 6.5–8.1)

## 2025-01-23 LAB — SAMPLE TO BLOOD BANK

## 2025-01-23 LAB — LACTATE DEHYDROGENASE: LDH: 652 U/L — ABNORMAL HIGH (ref 105–235)

## 2025-01-24 ENCOUNTER — Telehealth: Payer: Self-pay | Admitting: Hematology and Oncology

## 2025-01-24 NOTE — Telephone Encounter (Signed)
 Called pt to have future appts scheduled per los

## 2025-01-25 ENCOUNTER — Telehealth: Payer: Self-pay | Admitting: Hematology and Oncology

## 2025-01-25 NOTE — Telephone Encounter (Signed)
 Attempted to call pt to get future appts scheduled
